# Patient Record
Sex: Female | Born: 1965 | Race: White | Hispanic: No | Marital: Married | Smoking: Former smoker
Health system: Southern US, Community
[De-identification: ages and names within clinical notes are randomized; demographics above are authoritative.]

## PROBLEM LIST (undated history)

## (undated) DIAGNOSIS — M1990 Unspecified osteoarthritis, unspecified site: Secondary | ICD-10-CM

## (undated) DIAGNOSIS — R519 Headache, unspecified: Secondary | ICD-10-CM

## (undated) DIAGNOSIS — F32A Depression, unspecified: Secondary | ICD-10-CM

## (undated) DIAGNOSIS — F329 Major depressive disorder, single episode, unspecified: Secondary | ICD-10-CM

## (undated) DIAGNOSIS — I739 Peripheral vascular disease, unspecified: Secondary | ICD-10-CM

## (undated) DIAGNOSIS — K5090 Crohn's disease, unspecified, without complications: Secondary | ICD-10-CM

## (undated) HISTORY — DX: Peripheral vascular disease, unspecified: I73.9

## (undated) HISTORY — PX: ABDOMINAL SURGERY: SHX537

## (undated) HISTORY — DX: Depression, unspecified: F32.A

## (undated) HISTORY — DX: Major depressive disorder, single episode, unspecified: F32.9

---

## 1998-07-07 ENCOUNTER — Encounter: Admission: RE | Admit: 1998-07-07 | Discharge: 1998-07-07 | Payer: Self-pay | Admitting: *Deleted

## 1999-03-11 ENCOUNTER — Emergency Department (HOSPITAL_COMMUNITY): Admission: EM | Admit: 1999-03-11 | Discharge: 1999-03-11 | Payer: Self-pay | Admitting: Emergency Medicine

## 1999-03-23 ENCOUNTER — Other Ambulatory Visit: Admission: RE | Admit: 1999-03-23 | Discharge: 1999-03-23 | Payer: Self-pay | Admitting: Internal Medicine

## 1999-08-10 ENCOUNTER — Encounter: Payer: Self-pay | Admitting: Internal Medicine

## 1999-08-10 ENCOUNTER — Ambulatory Visit (HOSPITAL_COMMUNITY): Admission: RE | Admit: 1999-08-10 | Discharge: 1999-08-10 | Payer: Self-pay | Admitting: Internal Medicine

## 2000-10-04 ENCOUNTER — Encounter: Admission: RE | Admit: 2000-10-04 | Discharge: 2000-10-04 | Payer: Self-pay | Admitting: Family Medicine

## 2000-10-04 ENCOUNTER — Encounter: Payer: Self-pay | Admitting: Family Medicine

## 2002-01-22 ENCOUNTER — Encounter (HOSPITAL_COMMUNITY): Admission: RE | Admit: 2002-01-22 | Discharge: 2002-04-22 | Payer: Self-pay | Admitting: Internal Medicine

## 2002-05-19 ENCOUNTER — Encounter (HOSPITAL_COMMUNITY): Admission: RE | Admit: 2002-05-19 | Discharge: 2002-08-17 | Payer: Self-pay | Admitting: Internal Medicine

## 2002-10-15 ENCOUNTER — Encounter (HOSPITAL_COMMUNITY): Admission: RE | Admit: 2002-10-15 | Discharge: 2003-01-13 | Payer: Self-pay | Admitting: Internal Medicine

## 2004-12-12 ENCOUNTER — Ambulatory Visit: Payer: Self-pay | Admitting: Internal Medicine

## 2004-12-14 ENCOUNTER — Encounter: Admission: RE | Admit: 2004-12-14 | Discharge: 2004-12-14 | Payer: Self-pay | Admitting: Internal Medicine

## 2004-12-28 ENCOUNTER — Ambulatory Visit: Payer: Self-pay | Admitting: Internal Medicine

## 2005-01-12 ENCOUNTER — Ambulatory Visit: Payer: Self-pay | Admitting: Internal Medicine

## 2005-02-13 ENCOUNTER — Encounter: Admission: RE | Admit: 2005-02-13 | Discharge: 2005-02-13 | Payer: Self-pay | Admitting: Family Medicine

## 2005-07-03 ENCOUNTER — Ambulatory Visit: Payer: Self-pay | Admitting: Internal Medicine

## 2005-07-12 ENCOUNTER — Ambulatory Visit (HOSPITAL_COMMUNITY): Admission: RE | Admit: 2005-07-12 | Discharge: 2005-07-12 | Payer: Self-pay | Admitting: Internal Medicine

## 2005-07-20 ENCOUNTER — Ambulatory Visit: Payer: Self-pay | Admitting: Internal Medicine

## 2005-08-10 ENCOUNTER — Ambulatory Visit (HOSPITAL_COMMUNITY): Admission: RE | Admit: 2005-08-10 | Discharge: 2005-08-10 | Payer: Self-pay | Admitting: General Surgery

## 2005-08-30 ENCOUNTER — Inpatient Hospital Stay (HOSPITAL_COMMUNITY): Admission: RE | Admit: 2005-08-30 | Discharge: 2005-09-03 | Payer: Self-pay | Admitting: General Surgery

## 2005-08-30 ENCOUNTER — Encounter (INDEPENDENT_AMBULATORY_CARE_PROVIDER_SITE_OTHER): Payer: Self-pay | Admitting: Specialist

## 2005-09-07 ENCOUNTER — Inpatient Hospital Stay (HOSPITAL_COMMUNITY): Admission: AD | Admit: 2005-09-07 | Discharge: 2005-09-21 | Payer: Self-pay | Admitting: General Surgery

## 2005-09-08 ENCOUNTER — Encounter (INDEPENDENT_AMBULATORY_CARE_PROVIDER_SITE_OTHER): Payer: Self-pay | Admitting: Specialist

## 2005-09-20 ENCOUNTER — Encounter (INDEPENDENT_AMBULATORY_CARE_PROVIDER_SITE_OTHER): Payer: Self-pay | Admitting: *Deleted

## 2005-10-02 ENCOUNTER — Ambulatory Visit: Payer: Self-pay | Admitting: Internal Medicine

## 2005-11-29 ENCOUNTER — Ambulatory Visit: Payer: Self-pay | Admitting: Internal Medicine

## 2008-09-10 DIAGNOSIS — K5090 Crohn's disease, unspecified, without complications: Secondary | ICD-10-CM | POA: Insufficient documentation

## 2008-09-10 DIAGNOSIS — D126 Benign neoplasm of colon, unspecified: Secondary | ICD-10-CM

## 2008-09-14 ENCOUNTER — Ambulatory Visit: Payer: Self-pay | Admitting: Internal Medicine

## 2008-09-14 DIAGNOSIS — K5000 Crohn's disease of small intestine without complications: Secondary | ICD-10-CM | POA: Insufficient documentation

## 2008-09-14 DIAGNOSIS — R1031 Right lower quadrant pain: Secondary | ICD-10-CM | POA: Insufficient documentation

## 2008-09-14 DIAGNOSIS — R197 Diarrhea, unspecified: Secondary | ICD-10-CM | POA: Insufficient documentation

## 2008-09-14 LAB — CONVERTED CEMR LAB
ALT: 15 units/L (ref 0–35)
AST: 19 units/L (ref 0–37)
Albumin: 3.9 g/dL (ref 3.5–5.2)
Alkaline Phosphatase: 67 units/L (ref 39–117)
BUN: 11 mg/dL (ref 6–23)
Basophils Absolute: 0 10*3/uL (ref 0.0–0.1)
Basophils Relative: 0 % (ref 0.0–3.0)
Bilirubin, Direct: 0.1 mg/dL (ref 0.0–0.3)
CO2: 28 meq/L (ref 19–32)
Calcium: 9.3 mg/dL (ref 8.4–10.5)
Chloride: 108 meq/L (ref 96–112)
Creatinine, Ser: 0.6 mg/dL (ref 0.4–1.2)
Eosinophils Absolute: 0.2 10*3/uL (ref 0.0–0.7)
Eosinophils Relative: 1.9 % (ref 0.0–5.0)
GFR calc Af Amer: 140 mL/min
GFR calc non Af Amer: 116 mL/min
Glucose, Bld: 98 mg/dL (ref 70–99)
HCT: 38.2 % (ref 36.0–46.0)
Hemoglobin: 12.7 g/dL (ref 12.0–15.0)
Lymphocytes Relative: 27.2 % (ref 12.0–46.0)
MCHC: 33.3 g/dL (ref 30.0–36.0)
MCV: 98.5 fL (ref 78.0–100.0)
Monocytes Absolute: 0.2 10*3/uL (ref 0.1–1.0)
Monocytes Relative: 1.7 % — ABNORMAL LOW (ref 3.0–12.0)
Neutro Abs: 6.3 10*3/uL (ref 1.4–7.7)
Neutrophils Relative %: 69.2 % (ref 43.0–77.0)
Platelets: 130 10*3/uL — ABNORMAL LOW (ref 150–400)
Potassium: 3.8 meq/L (ref 3.5–5.1)
RBC: 3.87 M/uL (ref 3.87–5.11)
RDW: 12.2 % (ref 11.5–14.6)
Sodium: 140 meq/L (ref 135–145)
TSH: 2.4 microintl units/mL (ref 0.35–5.50)
Total Bilirubin: 0.6 mg/dL (ref 0.3–1.2)
Total Protein: 6.9 g/dL (ref 6.0–8.3)
WBC: 9.2 10*3/uL (ref 4.5–10.5)

## 2008-09-29 ENCOUNTER — Ambulatory Visit: Payer: Self-pay | Admitting: Internal Medicine

## 2008-09-29 ENCOUNTER — Encounter: Payer: Self-pay | Admitting: Internal Medicine

## 2008-09-29 ENCOUNTER — Encounter (INDEPENDENT_AMBULATORY_CARE_PROVIDER_SITE_OTHER): Payer: Self-pay | Admitting: *Deleted

## 2008-09-30 ENCOUNTER — Encounter: Payer: Self-pay | Admitting: Internal Medicine

## 2008-10-19 ENCOUNTER — Ambulatory Visit: Payer: Self-pay | Admitting: Internal Medicine

## 2008-12-24 ENCOUNTER — Encounter: Payer: Self-pay | Admitting: Internal Medicine

## 2009-01-13 ENCOUNTER — Ambulatory Visit: Payer: Self-pay | Admitting: Internal Medicine

## 2009-01-15 LAB — CONVERTED CEMR LAB
Basophils Absolute: 0 10*3/uL (ref 0.0–0.1)
Basophils Relative: 0.1 % (ref 0.0–3.0)
Eosinophils Absolute: 0.1 10*3/uL (ref 0.0–0.7)
Eosinophils Relative: 1 % (ref 0.0–5.0)
HCT: 38.4 % (ref 36.0–46.0)
Hemoglobin: 13.4 g/dL (ref 12.0–15.0)
Lymphocytes Relative: 21.7 % (ref 12.0–46.0)
MCHC: 34.7 g/dL (ref 30.0–36.0)
MCV: 106.1 fL — ABNORMAL HIGH (ref 78.0–100.0)
Monocytes Absolute: 0.3 10*3/uL (ref 0.1–1.0)
Monocytes Relative: 2.8 % — ABNORMAL LOW (ref 3.0–12.0)
Neutro Abs: 8.6 10*3/uL — ABNORMAL HIGH (ref 1.4–7.7)
Neutrophils Relative %: 74.4 % (ref 43.0–77.0)
Platelets: 225 10*3/uL (ref 150–400)
RBC: 3.62 M/uL — ABNORMAL LOW (ref 3.87–5.11)
RDW: 15.8 % — ABNORMAL HIGH (ref 11.5–14.6)
WBC: 11.5 10*3/uL — ABNORMAL HIGH (ref 4.5–10.5)

## 2009-03-17 ENCOUNTER — Encounter: Payer: Self-pay | Admitting: Internal Medicine

## 2009-03-22 ENCOUNTER — Telehealth: Payer: Self-pay | Admitting: Internal Medicine

## 2009-03-23 ENCOUNTER — Ambulatory Visit: Payer: Self-pay | Admitting: Internal Medicine

## 2009-03-23 LAB — CONVERTED CEMR LAB
Basophils Absolute: 0 10*3/uL (ref 0.0–0.1)
Basophils Relative: 0.1 % (ref 0.0–3.0)
Eosinophils Absolute: 0.2 10*3/uL (ref 0.0–0.7)
Eosinophils Relative: 1.9 % (ref 0.0–5.0)
HCT: 38 % (ref 36.0–46.0)
Hemoglobin: 13.1 g/dL (ref 12.0–15.0)
Lymphocytes Relative: 13.1 % (ref 12.0–46.0)
Lymphs Abs: 1.1 10*3/uL (ref 0.7–4.0)
MCHC: 34.4 g/dL (ref 30.0–36.0)
MCV: 105.1 fL — ABNORMAL HIGH (ref 78.0–100.0)
Monocytes Absolute: 0.6 10*3/uL (ref 0.1–1.0)
Monocytes Relative: 7.7 % (ref 3.0–12.0)
Neutro Abs: 6.5 10*3/uL (ref 1.4–7.7)
Neutrophils Relative %: 77.2 % — ABNORMAL HIGH (ref 43.0–77.0)
Platelets: 157 10*3/uL (ref 150.0–400.0)
RBC: 3.61 M/uL — ABNORMAL LOW (ref 3.87–5.11)
RDW: 13.2 % (ref 11.5–14.6)
WBC: 8.4 10*3/uL (ref 4.5–10.5)

## 2009-04-07 ENCOUNTER — Encounter: Payer: Self-pay | Admitting: Internal Medicine

## 2009-04-13 ENCOUNTER — Ambulatory Visit: Payer: Self-pay | Admitting: Internal Medicine

## 2009-05-24 ENCOUNTER — Ambulatory Visit: Payer: Self-pay | Admitting: Internal Medicine

## 2009-05-25 LAB — CONVERTED CEMR LAB
ALT: 18 units/L (ref 0–35)
AST: 20 units/L (ref 0–37)
Albumin: 3.8 g/dL (ref 3.5–5.2)
Alkaline Phosphatase: 67 units/L (ref 39–117)
Basophils Absolute: 0 10*3/uL (ref 0.0–0.1)
Basophils Relative: 0.2 % (ref 0.0–3.0)
Bilirubin, Direct: 0.1 mg/dL (ref 0.0–0.3)
Eosinophils Absolute: 0.1 10*3/uL (ref 0.0–0.7)
Eosinophils Relative: 1.7 % (ref 0.0–5.0)
HCT: 35.8 % — ABNORMAL LOW (ref 36.0–46.0)
Hemoglobin: 12.7 g/dL (ref 12.0–15.0)
Lymphocytes Relative: 20.9 % (ref 12.0–46.0)
Lymphs Abs: 1.8 10*3/uL (ref 0.7–4.0)
MCHC: 35.6 g/dL (ref 30.0–36.0)
MCV: 99.5 fL (ref 78.0–100.0)
Monocytes Absolute: 0.3 10*3/uL (ref 0.1–1.0)
Monocytes Relative: 4 % (ref 3.0–12.0)
Neutro Abs: 6.4 10*3/uL (ref 1.4–7.7)
Neutrophils Relative %: 73.2 % (ref 43.0–77.0)
Platelets: 165 10*3/uL (ref 150.0–400.0)
RBC: 3.6 M/uL — ABNORMAL LOW (ref 3.87–5.11)
RDW: 12.6 % (ref 11.5–14.6)
Total Bilirubin: 0.8 mg/dL (ref 0.3–1.2)
Total Protein: 7.2 g/dL (ref 6.0–8.3)
WBC: 8.6 10*3/uL (ref 4.5–10.5)

## 2009-07-26 ENCOUNTER — Ambulatory Visit: Payer: Self-pay | Admitting: Internal Medicine

## 2009-07-27 LAB — CONVERTED CEMR LAB
Basophils Absolute: 0.1 10*3/uL (ref 0.0–0.1)
Basophils Relative: 0.6 % (ref 0.0–3.0)
Eosinophils Absolute: 0.1 10*3/uL (ref 0.0–0.7)
Eosinophils Relative: 1.5 % (ref 0.0–5.0)
HCT: 39.3 % (ref 36.0–46.0)
Hemoglobin: 13.8 g/dL (ref 12.0–15.0)
Lymphocytes Relative: 26.2 % (ref 12.0–46.0)
Lymphs Abs: 2.4 10*3/uL (ref 0.7–4.0)
MCHC: 35.1 g/dL (ref 30.0–36.0)
MCV: 99.4 fL (ref 78.0–100.0)
Monocytes Absolute: 0.4 10*3/uL (ref 0.1–1.0)
Monocytes Relative: 4.4 % (ref 3.0–12.0)
Neutro Abs: 6.1 10*3/uL (ref 1.4–7.7)
Neutrophils Relative %: 67.3 % (ref 43.0–77.0)
Platelets: 144 10*3/uL — ABNORMAL LOW (ref 150.0–400.0)
RBC: 3.95 M/uL (ref 3.87–5.11)
RDW: 13.1 % (ref 11.5–14.6)
WBC: 9.1 10*3/uL (ref 4.5–10.5)

## 2009-09-09 ENCOUNTER — Encounter (INDEPENDENT_AMBULATORY_CARE_PROVIDER_SITE_OTHER): Payer: Self-pay | Admitting: *Deleted

## 2009-12-20 ENCOUNTER — Encounter (INDEPENDENT_AMBULATORY_CARE_PROVIDER_SITE_OTHER): Payer: Self-pay | Admitting: *Deleted

## 2010-03-31 ENCOUNTER — Telehealth: Payer: Self-pay | Admitting: Internal Medicine

## 2010-12-06 NOTE — Letter (Signed)
Summary: Colonoscopy Date Change Letter  [facility]  Chaney, Melissa [address]   Phone: [phone]  Fax: [phone]      December 20, 2009 MRN: [id number]   [facility] [place], [place]  [address]   Dear Melissa Chaney,   Previously you were recommended to have a repeat colonoscopy around this time. Your chart was recently reviewed by [name] of [facility]. Follow up colonoscopy is now recommended in March 2017. This revised recommendation is based on current, nationally recognized guidelines for colorectal cancer screening and polyp surveillance. These guidelines are endorsed by the [place] Task Force on Colorectal Cancer as well as numerous other major medical organizations.  Please understand that our recommendation assumes that you do not have any new symptoms such as bleeding, a change in bowel habits, anemia, or significant abdominal discomfort. If you do have any concerning GI symptoms or want to discuss the guideline recommendations, please call to arrange an office visit at your earliest convenience. Otherwise we will keep you in our reminder system and contact you 1-2 months prior to the date listed above to schedule your next colonoscopy.  Thank you,  Docia Chuck. Henrene Pastor, M.D.  [facility] [phone]

## 2010-12-06 NOTE — Progress Notes (Signed)
Summary: Schedule Office Visit  Phone Note Outgoing Call Call back at [facility] Phone [phone]   Call placed by: Bernita Buffy CMA Deborra Medina),  Mar 31, 2010 9:18 AM Call placed to: Patient Summary of Call: Left message on patients machine to call back. patient is due for recall office visit  Initial call taken by: Bernita Buffy CMA Deborra Medina),  Mar 31, 2010 9:19 AM  Follow-up for Phone Call        Left message on patients machine to call back. we will mail the pt a letter to remind her she is due for an office visit. Follow-up by: Bernita Buffy CMA ([place]),  April 18, 2010 1:28 PM

## 2011-03-24 NOTE — Op Note (Signed)
NAMENIKCOLE, EISCHEID                ACCOUNT NO.:  [id number]   MEDICAL RECORD NO.:  [id number]          PATIENT TYPE:  INP   LOCATION:  0005                         FACILITY:  [facility]   PHYSICIAN:  Edsel Petrin. Dalbert Batman, M.D.DATE OF BIRTH:  1966-07-27   DATE OF PROCEDURE:  08/30/2005  DATE OF DISCHARGE:                                 OPERATIVE REPORT   PREOPERATIVE DIAGNOSIS:  Crohn disease.   POSTOPERATIVE DIAGNOSIS:  Crohn disease.   OPERATION PERFORMED:  Laparoscopic assisted ileocecectomy.   SURGEON:  Dr. Edsel Petrin. Dalbert Batman.   FIRST ASSISTANT:  Dr. Alphonsa Overall.   OPERATIVE INDICATIONS:  This is a 45 year old white female who has carried a  diagnosis of Crohn's disease for 6 years. She has had progressive symptoms  despite a variety of medical regimens. She has lost 10-15 pounds over the  past 3 years. She has had a small-bowel follow-through which shows extensive  involvement of the terminal ileum with stricturing and dilatation of the  ileum proximal to this. No fistulas or abscesses. She has had a colonoscopy  which shows ulcers and stenosis of the ileum but the colon looks normal.  CT  scan shows thickening edema of the distal small bowel but no abscess or free  fluid. Dr. Scarlette Shorts asked me to consider resection of this area because of  progressive symptoms despite maximal medical management. She underwent a  bowel prep at home and is brought to the operating room electively.   OPERATIVE FINDINGS:  The patient's distal ileum was involved with classic  Crohn disease. About 2 to 2.5 feet of terminal ileum were very thickened  with extensive fat creeping, pinning of the mesentery and the mid ileum just  proximal to this area was somewhat dilated suggesting a chronic partial  obstruction. The right colon, transverse colon and the left colon looked  normal. I examined the entire small bowel from the ligament of Treitz all  the way down to the ileocecal valve and there were no  other obvious areas of  involvement. The liver looked normal. There was no ascites. The peritoneal  surfaces looked normal. There was no abscess or fistula apparent.   OPERATIVE TECHNIQUE:  Following the induction of general endotracheal  anesthesia, the patient's abdomen was prepped and draped in a sterile  fashion. The patient was identified. Intravenous antibiotics were given.  0.5% Marcaine with epinephrine was used as a local infiltration anesthetic.  A 10 mm Hassan trocar was inserted just above the umbilicus through a  midline incision and secured with a pursestring suture of #0 Vicryl.  Pneumoperitoneum was created. A video camera was inserted with findings as  described above. A 10 mm trocar was placed in the lower midline just at the  hairline and a 10 mm trocar was placed in the left mid abdomen.   We positioned the patient in Trendelenburg position, rolled to the left. We  mobilized the terminal ileum and right colon and hepatic flexure using a  harmonic scalpel and blunt dissection mobilizing the colon all the way over  to the midline. We identified the duodenum  without any difficulty. We very  carefully spent some time taking down the mesentery of the ileocecal area to  get good mobilization. Once we had good mobilization, we released the  pneumoperitoneum and removed the [facility] trocar at the umbilicus and made  about a 6 cm vertically oriented incision above the umbilicus dividing the  fascia in the midline. We placed a wound protractor in place and we were  able to extract the right colon and ileum through this wound without too  much trouble. There was no bleeding. We transected the ileum about 6 inches  proximal to the obvious diseased area. We did this transection with a GIA  stapling device. We transected the cecum just above the ileocecal valve with  a GIA stapling device. We very carefully took down the mesentery dividing  the peritoneum with cautery and then  isolating mesenteric vessels between  Kelly clamps and dividing them. These were tied with 2-0 silk ties but the  larger thicker ones, we suture ligated with numerous 2-0 silk suture  ligatures. We spent some time making sure that everything was completely  clean and hemostatic. We irrigated this area out. The specimen was sent on  to pathology for routine histology. Anastomosis was created between the mid  ileum and the right colon using a GIA stapling device. The defect in the  bowel wall was closed with TA-60 stapling device. At this point, we changed  our gloves and instruments and suction devices. We closed the mesentery with  interrupted sutures of 2-0 silk. We irrigated out the abdomen with some  saline, there was no bleeding that we could detect. We examined the  anastomosis. It looked good. We placed some 2-0 silk sutures to reinforce  the staple line at critical places. We then placed Tisseel tissue sealant  all along the anastomosis line and let that harden for about 5 minutes and  then returned the bowel to the abdominal cavity. The midline fascia was  closed with a running suture of #1 PDS, the wound was irrigated with saline,  skin closed with skin staples. The pneumoperitoneum was reestablished. We  reinserted the video camera and examined the abdominal cavity, pelvis and  subphrenic and subhepatic space. There was a little bit of blood tinged  fluid which was evacuated. We checked all the areas of dissection and there  was no bleeding. We removed the trocars under direct vision, there is no  bleeding from the trocar sites. The pneumoperitoneum was released. The two  remaining trocar sites were closed with skin staples. Clean bandages were  placed and the patient taken to the recovery room in stable condition.  Estimated blood loss was probably about 40 mL. Complications none. Sponge,  needle and instrument counts were correct.      Edsel Petrin. Dalbert Batman, M.D. Electronically  Signed     HMI/MEDQ  D:  08/30/2005  T:  08/30/2005  Job:  [id number]   cc:   Docia Chuck. Henrene Pastor, M.D. [facility]  [address]  [place]  [place] [address]   Chipper Herb, M.D.  Fax: [phone]

## 2011-03-24 NOTE — Discharge Summary (Signed)
NAMESHELA, Melissa Chaney                ACCOUNT NO.:  [id number]   MEDICAL RECORD NO.:  [id number]          PATIENT TYPE:  INP   LOCATION:  55                         FACILITY:  [facility]   PHYSICIAN:  Edsel Petrin. Dalbert Batman, M.D.DATE OF BIRTH:  [birth year]   DATE OF ADMISSION:  08/30/2005  DATE OF DISCHARGE:  09/03/2005                                 DISCHARGE SUMMARY   FINAL DIAGNOSIS:  Crohn's disease with partial obstruction of the ileum.   OPERATION PERFORMED:  Laparoscopic-assisted resection of the distal ileum  and cecum with primary anastomosis.   DATE OF SURGERY:  08/30/2005   HISTORY:  This is a 45 year old white female who has a 6-year history of  Crohn disease. She had progressive symptoms with right lower quadrant pain,  nausea, and weight loss over the past 3 years.  This has occurred despite  maximal medical management. Small-bowel follow through shows that she has  extensive involvement of terminal ileum with structuring and dilatation of  the bowels, proximal to this, suggesting partial obstruction. She has never  had a fistula or abscess demonstrated. She had a colonoscopy which showed  ulcerative stenosis of the ileum, but the colon looked normal. She was  referred by [name] for consideration of resection of her strictured area.  She was counseled as an outpatient regarding this. She has undergone a drop  prep at home; and was brought to operating room electively.   PHYSICAL EXAM:  GENERAL:  A pleasant, healthy-appearing, thin, white female  in the distress.  NECK:  Reveals no adenopathy mass.  LUNGS:  Clear to auscultation.  HEART:  Regular rate and rhythm, no murmur.  ABDOMEN:  Soft, nondistended. She is subjectively tender in the right lower  quadrant, but no mass, no guarding no rebound, no hernias.   HOSPITAL COURSE:  On the admission the patient was taken to operating room  and underwent  laparoscopic-assisted resection of her Crohn disease. I found  that she  had about 2 feet of clearly diseased ileum. The right colon looked  perfectly normal, around the small bowel on two occasions; and there was no  other disease identified. We resected all gross disease and performed a  primary anastomosis.   Postoperatively the patient did well. She began having flatus on postop day  #2 and also had a bowel movement that day after a Dulcolax suppository,  although she felt a little distended.  On 10/29 she has had four bowel  movements and felt much better; and wanted to go home. She was discharged on  09/03/2005. She was given a prescription for Vicodin for pain. She was asked  to followup in 3-5 days to get her staples removed.      Edsel Petrin. Dalbert Batman, M.D.  Electronically Signed     HMI/MEDQ  D:  09/11/2005  T:  09/11/2005  Job:  [id number]   cc:   Docia Chuck. Henrene Pastor, M.D. [facility]  [address]  [place]  [place] [address]   Chipper Herb, M.D.  Fax: [phone]

## 2011-03-24 NOTE — Op Note (Signed)
NAMEXELA, Melissa Chaney                ACCOUNT NO.:  [id number]   MEDICAL RECORD NO.:  [id number]          PATIENT TYPE:  INP   LOCATION:  5730                         FACILITY:  [facility]   PHYSICIAN:  Edsel Petrin. Dalbert Batman, M.D.DATE OF BIRTH:  Nov 13, 1965   DATE OF PROCEDURE:  09/08/2005  DATE OF DISCHARGE:                                 OPERATIVE REPORT   PREOPERATIVE DIAGNOSIS:  Small-bowel obstruction.   POSTOPERATIVE DIAGNOSIS:  Small-bowel obstruction secondary to adhesions and  suspected microscopic anastomotic leak.   OPERATION PERFORMED:  1.  Exploratory laparotomy.  2.  Lysis of adhesions.  3.  Resection of ileocolic anastomosis with creation of new primary      anastomosis.   SURGEON:  Edsel Petrin. Dalbert Batman, M.D.   FIRST ASSISTANT:  Shellia Carwin, M.D.   OPERATIVE INDICATIONS:  This is a 45 year old white female who has a 6-year  history of Crohn disease.  She has been on progressive multiple  immunosuppressive drugs and has failed to respond.  She went on to develop  stricturing of her ileum and partial obstruction.  She was operated upon  electively on August 30, 2005, at which time she underwent an uneventful  laparoscopic-assisted ileocolic resection.  She did well and was discharged  home approximately 5 or 6 days later.  She came back to the office yesterday  with nausea, vomiting, diffuse abdominal pain and was found be obviously  dehydrated and hypotensive.  She was admitted to hospital last night and had  a nasogastric tube placed with large output.  She had abdominal films and CT  scan last night which showed a distal small-bowel obstruction, but no  evidence of abscess, free air or perforation.  Her white blood cell count is  normal.  This morning she felt a little bit better, but not much, still had  mild diffuse abdominal tenderness and x-ray showed no improvement in her  bowel obstruction.  She is brought to operating room for exploration.   OPERATIVE FINDINGS:   The patient was obstructed in the distal ileum from the  anastomosis being stuck down in the pelvis.  When I pulled the anastomosis  up, there was a little bit of inflammatory exudate on it.  There was no  significant odor or fecal soilage, but the small bowel was quite dilated.  I  examined the entire small bowel on several occasions from the ligament of  Treitz all the way down to the anastomosis and found that I could milk small  bowel content easily into the colon.  On a couple of occasions, I thought I  smelled a small feculent odor, but I was not sure.  I began to examine the  anastomosis and saw that there was a little bit of inflammatory exudate.  There was 1 area where I probed with a hemostat that then went into the  anastomosis.  I theorize that she must have a small microscopic perforation  that set up an inflammatory reaction causing adhesions in the pelvis and the  subsequent obstruction.  I felt that the anastomosis would need to be  resected.  I transected the terminal ileum about 3 or 4 inches proximal to  the anastomosis with a GIA stapling device.  I transected the ascending  colon about 2 inches distal to the anastomosis.  The mesentery was somewhat  thickened from the chronic Crohn's disease; the mesenteric vessels were  isolated, clamped, and divided, and ligated with 2-0 silk ties and larger  areas were ligated 2-0 silk suture ligatures.  Specimen was removed.  It  should be noted that I took cultures earlier in the case of the peritoneal  fluid.   I then irrigated the abdomen and pelvis quite copiously with about 6 L of  saline until the irrigation fluid was completely clear.  I checked the  transverse colon, descending colon and sigmoid colon and it looked fine.  I  ran the small bowel from ligament of Treitz all the way down to the resected  area and it looked fine.  I milked the small bowel content retrograde and it  was evacuated out the NG tube, approximately  800 mL.   We created a new anastomosis between the terminal ileum and the mid-  ascending colon using a GIA stapling device.  We inspected the staple line  and it looked perfectly fine.  The defect in the bowel wall was closed with  a TA 60 stapling device.  There were a few bleeders on the TA 60 staple line  and these were controlled with figure-of-eight sutures of 3-0 silk.  A few  extra sutures of 2-0 silk were placed to reinforce the staple line at  strategic places.   We then changed our gloves and instruments and then irrigated the abdomen  copiously with another 2 or 3 L of saline.  The mesentery was closed with  interrupted figure-of-eight sutures of 2-0 silk.  We checked all the areas  of dissection and suturing and there is no bleeding or hematoma anywhere.  We checked the duodenum; it looked fine.  We checked a nasogastric tube and  positioned it. The stomach was empty.  We felt that nothing further needed  to be done.  The midline fascia was closed with a running suture of #1 PDS.  The skin was closed with skin staples.  Clean bandages were placed and the  patient taken to the recovery room in stable condition.  Estimated blood  loss was about 100 mL. Complications -- none.  Sponge, needle and instrument  counts were correct.      Edsel Petrin. Dalbert Batman, M.D.  Electronically Signed     HMI/MEDQ  D:  09/08/2005  T:  09/09/2005  Job:  [id number]   cc:   Docia Chuck. Henrene Pastor, M.D. [facility]  [address]  [place]  [place] [address]   Chipper Herb, M.D.  Fax: [phone]

## 2011-03-24 NOTE — H&P (Signed)
NAMEBRISIA, Chaney                ACCOUNT NO.:  [id number]   MEDICAL RECORD NO.:  [id number]            PATIENT TYPE:   LOCATION:                                 FACILITY:   PHYSICIAN:  Edsel Petrin. Melissa Chaney, M.D.     DATE OF BIRTH:   DATE OF ADMISSION:  09/07/2005  DATE OF DISCHARGE:                                HISTORY & PHYSICAL   CHIEF COMPLAINT:  Abdominal pain, dehydration, vomiting.   HISTORY OF PRESENT ILLNESS:  This is a 45 year old white female who has a 6-  year history of Crohn's disease.  Because of failure to respond to medical  therapy, she underwent a laparoscopic-assisted ileocecectomy on August 30, 2005.  In the hospital, she did very well with resumption of diet and bowel  function, and was discharged from the hospital 5 days ago.  She states that  she has not felt well since.  She has had nausea and vomiting on a daily  basis at least twice.  She will vomit small amounts, probably less than 8  ounces, of greenish fluid.  No large amounts of vomiting.  She has continued  to have stools 2-3 times a day that tend to be brownish liquid, but not very  large volume.  She denies any fever or chills.  She has been somewhat dizzy.   PAST HISTORY:  1.  Crohn's disease x6 years, status post resection August 30, 2005.  2.  She has had 2 pregnancies and 2 vaginal deliveries.   CURRENT MEDICATIONS:  1.  Hydrocodone p.r.n. pain.  2.  Wellbutrin 300 mg one tablet daily.  3.  Calcium with D 600 mg b.i.d.  4.  She used to be on Entocort EC 3 mg three capsules daily.  5.  She was on mercaptopurine 50 mg two tablets daily, but that has been      withheld postoperatively.   DRUG ALLERGIES:  CODEINE.   SOCIAL HISTORY:  Married with 2 children.  He husband is not here, but her  mother is with her today.  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  She smokes 1 pack of cigarettes a  day.  Drinks alcohol occasionally.   FAMILY  HISTORY:  Father and mother living.  Both have hypertension.  Four  sisters - 1 with Crohn's disease.   REVIEW OF SYSTEMS:  All systems reviewed.  They are noncontributory, except  as described above.   PHYSICAL EXAMINATION:  GENERAL:  A thin young woman who appears ill.  She is  alert and cooperative.  VITAL SIGNS:  Blood pressure 88/44, pulse 120 and regular, temperature 97.2.  SKIN:  Dry.  HEENT:  Eyes - sclerae clear.  Extraocular movements intact.  Ears, nose,  mouth, and throat - nose, lips, tongue, and oropharynx are without gross  lesions.  NECK:  Supple, nontender.  No mass.  No adenopathy.  No jugular venous  distention.  LUNGS:  Clear to auscultation.  HEART:  Regular tachycardia.  No murmur.  ABDOMEN:  Scars are  healing well without any signs of infection.  The  abdomen is borderline distended.  Bowel sounds are present but are  hypoactive.  She has mild diffuse tenderness.  No rigidity.  EXTREMITIES:  She moves all 4 extremities well without pain or deformity.  NEUROLOGIC:  No gross motor or sensory deficit.   ASSESSMENT:  1.  Abdominal pain, vomiting, hypotension, and dehydration.  Rule out      postoperative obstruction.  Rule out postoperative anastomotic leak.      Rule out postoperative abscess.  2.  History of Crohn's disease status post resection on August 30, 2005.   PLAN:  1.  The patient will be admitted as an emergency to [facility] today      where a bed is available.  2.  She will received IV fluid resuscitation over the next hour or two in      hopes of normalizing her vital signs.  3.  We will then proceed with imaging with plain films and CT scanning.   I have explained to the patient and her mother that this may represent a  surgical complication, as listed above.  We will await the work-up.      Edsel Petrin. Melissa Chaney, M.D.  Electronically Signed     HMI/MEDQ  D:  09/07/2005  T:  09/07/2005  Job:  [id number]   cc:   Docia Chuck. Henrene Pastor, M.D. [facility]   [address]  [place]  [place] [address]   Chipper Herb, M.D.  Fax: [phone]

## 2011-03-24 NOTE — Discharge Summary (Signed)
NAMELILLER, YOHN                ACCOUNT NO.:  [id number]   MEDICAL RECORD NO.:  [id number]          PATIENT TYPE:  INP   LOCATION:  5730                         FACILITY:  [facility]   PHYSICIAN:  Edsel Petrin. Dalbert Batman, M.D.DATE OF BIRTH:  16-Sep-1966   DATE OF ADMISSION:  09/07/2005  DATE OF DISCHARGE:  09/21/2005                                 DISCHARGE SUMMARY   FINAL DIAGNOSES:  1.  Small-bowel obstruction secondary to adhesions.  2.  Suspect microscopic anastomotic leak.  3.  Crohn's disease.   PROCEDURES:  Exploratory laparotomy with lysis of adhesions, resection of  ileocolic anastomosis with redo ileocolic anastomosis on September 08, 2005.   HISTORY OF PRESENT ILLNESS:  This is a 45 year old white female with a six-  year history of Crohn's disease.  She failed to respond to medical therapy  and underwent a laparoscopic-assisted limited right colectomy on August 30, 2005.  She did well and was discharged from the hospital five days prior to  this admission.  She has not felt well at home and had nausea and vomiting  but continued to have stools.  She was admitted for evaluation of her  abdominal pain and possible postoperative complications.   PHYSICAL EXAMINATION:  GENERAL:  Thin young woman who appeared ill, alert  and cooperative.  VITAL SIGNS:  Blood pressure 88/44, pulse 120 and regular, respirations 18,  temperature 97.2.  HEENT:  Mucous membranes were dry.  ABDOMEN:  Well-healed scars.  The abdomen was slightly distended.  Bowel  sounds were present by hypoactive.  Mild diffuse tenderness.  No rigidity.   HOSPITAL COURSE:  The patient was admitted and started on IV fluid  resuscitation.  X-rays suggested a small-bowel obstruction.  A nasogastric  tube was placed and had moderate output, up to 900 cc in an eight-hour  period.  Her white blood cell count was 7400, hemoglobin 10.9.  A CT scan  was performed which showed a persistent small-bowel obstruction with one  loop of jejunum 6.4 cm in diameter and some air in the colon.  She remained  quite tender, and we elected to proceed with a laparotomy.   She was taken to the operating room on September 08, 2005.  She had moderate  adhesions, especially to the anastomosis.  There was no obvious perforation.  There was no obvious abscess.  There was no obvious exudate, but I felt that  there was some technical problem with the anastomosis, so I elected to  resect the anastomosis and perform a new anastomosis of the ilium to the  hepatic flexure.  That was uneventful.   Postoperatively, the patient did relatively well, without any major  complications.  Her Foley catheter was removed, and she voided uneventfully.  She had some hypokalemia which was corrected with intravenous potassium.  She was maintained on hyperalimentation because of her presumed malnourished  state.  Intraoperative cultures were taken and did grow E coli sensitive to  Primaxin, and her antibiotics were adjusted.   Her pathology report showed the ileocolic anastomosis with mucosal  ulceration and an area of bowel infarction  associated with acute  inflammation.  No malignancy was identified.   The patient's ileus ultimately resolved slowly.  Over the next several days,  her GI function slowly improved, although her ileus was slow to resolve.   She was ready for discharge on September 21, 2005.  At that time, she was  feeling much better, eating well, some cramping that she had had subsided.  She was having loose stools which were tested for C difficile which was  negative.  Her abdominal exam was unremarkable, quite soft, with a healthy  granulating wound.  She had had a CT scan prior to discharge which showed no  abscess or bowel obstruction, and her blood work looked fine.   DISCHARGE MEDICATIONS:  1.  Vicodin for pain.  2.  Wellbutrin.  3.  Multivitamins with iron.  4.  Levsin.   FOLLOW UP:  She was asked to come back and see me  in the office in one week.  She was advised to see Dr. Scarlette Shorts at some point to decide about longterm  Crohn's management and possible suppressive pharmacologic therapy.      Edsel Petrin. Dalbert Batman, M.D.  Electronically Signed     HMI/MEDQ  D:  11/11/2005  T:  11/12/2005  Job:  [id number]   cc:   Docia Chuck. Henrene Past[place], M.D. [facility]  [address]  [place]  [place] [address]   Chipper Herb, M.D.  Fax: [phone]

## 2012-02-20 ENCOUNTER — Encounter: Payer: Self-pay | Admitting: Internal Medicine

## 2015-03-19 ENCOUNTER — Encounter: Payer: Self-pay | Admitting: Internal Medicine

## 2015-11-07 ENCOUNTER — Emergency Department (HOSPITAL_COMMUNITY): Payer: No Typology Code available for payment source

## 2015-11-07 ENCOUNTER — Emergency Department (HOSPITAL_COMMUNITY)
Admission: EM | Admit: 2015-11-07 | Discharge: 2015-11-07 | Disposition: A | Discharge status: Home / Self Care | Payer: No Typology Code available for payment source | Attending: Emergency Medicine | Admitting: Emergency Medicine

## 2015-11-07 ENCOUNTER — Encounter (HOSPITAL_COMMUNITY): Payer: Self-pay | Admitting: *Deleted

## 2015-11-07 DIAGNOSIS — Z9889 Other specified postprocedural states: Secondary | ICD-10-CM | POA: Diagnosis not present

## 2015-11-07 DIAGNOSIS — K5010 Crohn's disease of large intestine without complications: Secondary | ICD-10-CM

## 2015-11-07 DIAGNOSIS — F17210 Nicotine dependence, cigarettes, uncomplicated: Secondary | ICD-10-CM | POA: Diagnosis not present

## 2015-11-07 DIAGNOSIS — R1084 Generalized abdominal pain: Secondary | ICD-10-CM | POA: Diagnosis present

## 2015-11-07 DIAGNOSIS — K5090 Crohn's disease, unspecified, without complications: Secondary | ICD-10-CM | POA: Diagnosis not present

## 2015-11-07 HISTORY — DX: Crohn's disease, unspecified, without complications: K50.90

## 2015-11-07 LAB — CBC WITH DIFFERENTIAL/PLATELET
BASOS PCT: 0 %
Basophils Absolute: 0 10*3/uL (ref 0.0–0.1)
EOS ABS: 0.1 10*3/uL (ref 0.0–0.7)
Eosinophils Relative: 1 %
HEMATOCRIT: 41.3 % (ref 36.0–46.0)
HEMOGLOBIN: 14.3 g/dL (ref 12.0–15.0)
LYMPHS ABS: 0.7 10*3/uL (ref 0.7–4.0)
Lymphocytes Relative: 5 %
MCH: 33.9 pg (ref 26.0–34.0)
MCHC: 34.6 g/dL (ref 30.0–36.0)
MCV: 97.9 fL (ref 78.0–100.0)
MONO ABS: 0.5 10*3/uL (ref 0.1–1.0)
MONOS PCT: 4 %
Neutro Abs: 12 10*3/uL — ABNORMAL HIGH (ref 1.7–7.7)
Neutrophils Relative %: 90 %
Platelets: 174 10*3/uL (ref 150–400)
RBC: 4.22 MIL/uL (ref 3.87–5.11)
RDW: 13.8 % (ref 11.5–15.5)
WBC: 13.2 10*3/uL — ABNORMAL HIGH (ref 4.0–10.5)

## 2015-11-07 LAB — COMPREHENSIVE METABOLIC PANEL
ALBUMIN: 4 g/dL (ref 3.5–5.0)
ALK PHOS: 84 U/L (ref 38–126)
ALT: 13 U/L — ABNORMAL LOW (ref 14–54)
ANION GAP: 13 (ref 5–15)
AST: 20 U/L (ref 15–41)
BILIRUBIN TOTAL: 1 mg/dL (ref 0.3–1.2)
BUN: 14 mg/dL (ref 6–20)
CALCIUM: 9.4 mg/dL (ref 8.9–10.3)
CO2: 23 mmol/L (ref 22–32)
Chloride: 103 mmol/L (ref 101–111)
Creatinine, Ser: 0.74 mg/dL (ref 0.44–1.00)
GFR calc non Af Amer: >60 mL/min (ref >60)
GLUCOSE: 170 mg/dL — AB (ref 65–99)
POTASSIUM: 3.9 mmol/L (ref 3.5–5.1)
SODIUM: 139 mmol/L (ref 135–145)
TOTAL PROTEIN: 7.1 g/dL (ref 6.5–8.1)

## 2015-11-07 LAB — URINALYSIS, ROUTINE W REFLEX MICROSCOPIC
BILIRUBIN URINE: NEGATIVE
Glucose, UA: NEGATIVE mg/dL
Hgb urine dipstick: NEGATIVE
KETONES UR: 15 mg/dL — AB
LEUKOCYTES UA: NEGATIVE
NITRITE: NEGATIVE
PROTEIN: NEGATIVE mg/dL
Specific Gravity, Urine: >1.03 — ABNORMAL HIGH (ref 1.005–1.030)
pH: 5.5 (ref 5.0–8.0)

## 2015-11-07 LAB — LIPASE, BLOOD: Lipase: 24 U/L (ref 11–51)

## 2015-11-07 MED ORDER — ONDANSETRON 8 MG PO TBDP: 8.0000 mg | ORAL_TABLET | Freq: Once | ORAL | Status: DC

## 2015-11-07 MED ORDER — ONDANSETRON HCL 4 MG/2ML IJ SOLN: 4.0000 mg | Freq: Once | INTRAMUSCULAR | Status: DC

## 2015-11-07 MED ORDER — SODIUM CHLORIDE 0.9 % IV BOLUS (SEPSIS)
1000.0000 mL | Freq: Once | INTRAVENOUS | Status: AC
  Administered 2015-11-07: 1000 mL via INTRAVENOUS

## 2015-11-07 MED ORDER — PREDNISONE 50 MG PO TABS
60.0000 mg | ORAL_TABLET | Freq: Once | ORAL | Status: AC
  Administered 2015-11-07: 60 mg via ORAL
  Filled 2015-11-07: qty 1

## 2015-11-07 MED ORDER — MORPHINE SULFATE (PF) 4 MG/ML IV SOLN
4.0000 mg | Freq: Once | INTRAVENOUS | Status: AC
  Administered 2015-11-07: 4 mg via INTRAVENOUS

## 2015-11-07 MED ORDER — DIATRIZOATE MEGLUMINE & SODIUM 66-10 % PO SOLN
ORAL | Status: AC
  Filled 2015-11-07: qty 30

## 2015-11-07 MED ORDER — OXYCODONE-ACETAMINOPHEN 5-325 MG PO TABS
1.0000 | ORAL_TABLET | Freq: Three times a day (TID) | ORAL | Status: DC | PRN
Start: 2015-11-07 — End: 2016-08-09

## 2015-11-07 MED ORDER — IOHEXOL 300 MG/ML  SOLN
100.0000 mL | Freq: Once | INTRAMUSCULAR | Status: AC | PRN
  Administered 2015-11-07: 100 mL via INTRAVENOUS

## 2015-11-07 MED ORDER — PREDNISONE 50 MG PO TABS: ORAL_TABLET | ORAL | Status: DC

## 2015-11-07 MED ORDER — FENTANYL CITRATE (PF) 100 MCG/2ML IJ SOLN
50.0000 ug | Freq: Once | INTRAMUSCULAR | Status: AC
  Administered 2015-11-07: 50 ug via INTRAVENOUS
  Filled 2015-11-07: qty 2

## 2015-11-07 MED ORDER — MORPHINE SULFATE (PF) 4 MG/ML IV SOLN
INTRAVENOUS | Status: AC
  Filled 2015-11-07: qty 1

## 2015-11-07 NOTE — ED Notes (Signed)
Pt states she started having abdominal pain & vomiting that started about 1900 yesterday. Pt vomiting during triage.

## 2015-11-07 NOTE — ED Provider Notes (Signed)
CSN: [id number]     Arrival date & time 11/07/15  0133 History   First MD Initiated Contact with Patient 11/07/15 0202     Chief Complaint  Patient presents with  . Abdominal Pain     Patient is a 50 y.o. female presenting with abdominal pain. The history is provided by the patient and the spouse.  Abdominal Pain Pain location:  Generalized Pain quality: aching   Pain severity:  Severe Onset quality:  Sudden Duration:  8 hours Timing:  Constant Progression:  Worsening Chronicity:  New Relieved by:  Nothing Worsened by:  Movement and palpation Associated symptoms: chills, diarrhea, nausea and vomiting   Associated symptoms: no chest pain and no hematemesis   pt reports abrupt onset of abd pain/vomiting and one episode of diarrhea She has not had this recently She had otherwise been well She reports h/o crohn's disease, not currently treated and had bowel resection 10 yrs ago No recent issues with crohns  No sick contacts   Past Medical History  Diagnosis Date  . Crohn disease [facility])    Past Surgical History  Procedure Laterality Date  . Abdominal surgery     No family history on file. Social History  Substance Use Topics  . Smoking status: Current Every Day Smoker    Types: Cigarettes  . Smokeless tobacco: None  . Alcohol Use: Yes   OB History    No data available     Review of Systems  Constitutional: Positive for chills.  Cardiovascular: Negative for chest pain.  Gastrointestinal: Positive for nausea, vomiting, abdominal pain and diarrhea. Negative for hematemesis.  All other systems reviewed and are negative.     Allergies  Codeine  Home Medications   Prior to Admission medications   Not on File   BP 126/76 mmHg  Pulse 91  Temp(Src) 97.8 F (36.6 C) (Oral)  Resp 22  Ht 5' 7"  (1.702 m)  Wt 52.164 kg  BMI 18.01 kg/m2  SpO2 97% Physical Exam CONSTITUTIONAL: Well developed/well nourished, uncomfortable appearing HEAD:  Normocephalic/atraumatic EYES: EOMI ENMT: Mucous membranes dry NECK: supple no meningeal signs SPINE/BACK:entire spine nontender CV: S1/S2 noted, no murmurs/rubs/gallops noted LUNGS: Lungs are clear to auscultation bilaterally, no apparent distress ABDOMEN: soft, moderate RLQ tenderness, healed scars noted, no rebound or guarding, bowel sounds noted throughout abdomen GU:no cva tenderness NEURO: Pt is awake/alert/appropriate, moves all extremitiesx4.  No facial droop.   EXTREMITIES: pulses normal/equal, full ROM SKIN: warm, color normal PSYCH: no abnormalities of mood noted, alert and oriented to situation  ED Course  Procedures  Medications  ondansetron (ZOFRAN-ODT) disintegrating tablet 8 mg (0 mg Oral Hold 11/07/15 0228)  ondansetron (ZOFRAN) injection 4 mg (0 mg Intravenous Hold 11/07/15 0219)  ondansetron (ZOFRAN) injection 4 mg (0 mg Intravenous Hold 11/07/15 0219)  morphine 4 MG/ML injection (not administered)  diatrizoate meglumine-sodium (GASTROGRAFIN) 66-10 % solution (not administered)  sodium chloride 0.9 % bolus 1,000 mL (0 mLs Intravenous Stopped 11/07/15 0305)  morphine 4 MG/ML injection 4 mg (4 mg Intravenous Given 11/07/15 0217)  fentaNYL (SUBLIMAZE) injection 50 mcg (50 mcg Intravenous Given 11/07/15 0304)  iohexol (OMNIPAQUE) 300 MG/ML solution 100 mL (100 mLs Intravenous Contrast Given 11/07/15 0433)  predniSONE (DELTASONE) tablet 60 mg (60 mg Oral Given 11/07/15 0549)    Labs Review Labs Reviewed  CBC WITH DIFFERENTIAL/PLATELET - Abnormal; Notable for the following:    WBC 13.2 (*)    Neutro Abs 12.0 (*)    All other components within normal limits  COMPREHENSIVE METABOLIC PANEL - Abnormal; Notable for the following:    Glucose, Bld 170 (*)    ALT 13 (*)    All other components within normal limits  URINALYSIS, ROUTINE W REFLEX MICROSCOPIC (NOT AT [facility]) - Abnormal; Notable for the following:    Specific Gravity, Urine >1.030 (*)    Ketones, ur 15 (*)    All other  components within normal limits  LIPASE, BLOOD    Imaging Review Ct Abdomen Pelvis W Contrast  11/07/2015  CLINICAL DATA:  Acute onset of right lower quadrant abdominal pain and vomiting. Current history of Crohn's disease, status post resection of bowel. Initial encounter. EXAM: CT ABDOMEN AND PELVIS WITH CONTRAST TECHNIQUE: Multidetector CT imaging of the abdomen and pelvis was performed using the standard protocol following bolus administration of intravenous contrast. CONTRAST:  146m OMNIPAQUE IOHEXOL 300 MG/ML  SOLN COMPARISON:  CT of the abdomen and pelvis from 09/20/2005 FINDINGS: The visualized lung bases are clear. The liver and spleen are unremarkable in appearance. The gallbladder is within normal limits. The pancreas and adrenal glands are unremarkable. The kidneys are unremarkable in appearance. There is no evidence of hydronephrosis. No renal or ureteral stones are seen. No perinephric stranding is appreciated. The remaining small bowel is unremarkable in appearance. The stomach is within normal limits. No acute vascular abnormalities are seen. Scattered calcification is noted along the abdominal aorta and its branches. The patient's ileocolic anastomosis is grossly unremarkable. Vaguely increased wall enhancement is noted along the entirety of the remaining colon, with suggestion of mild associated hyperemia, raising question for mild acute exacerbation of the patient's Crohn's disease. No significant free fluid is seen. The bladder is mildly distended and grossly unremarkable. The uterus is unremarkable in appearance. The ovaries are grossly symmetric. No suspicious adnexal masses are seen. No inguinal lymphadenopathy is seen. No acute osseous abnormalities are identified. IMPRESSION: 1. Vaguely increased wall enhancement noted along the entirety of the remaining colon, with suggestion of mild associated hyperemia, raising concern for mild acute exacerbation of the patient's Crohn's disease. No  significant free fluid seen. 2. Scattered calcification along the abdominal aorta and its branches. Electronically Signed   By: JGarald BaldingM.D.   On: 11/07/2015 05:10   I have personally reviewed and evaluated these lab results as part of my medical decision-making.   EKG Interpretation   Date/Time:  Sunday November 07 2015 02:14:38 EST Ventricular Rate:  99 PR Interval:  147 QRS Duration: 83 QT Interval:  373 QTC Calculation: 479 R Axis:   86 Text Interpretation:  Sinus rhythm Biatrial enlargement RSR' in V1 or V2,  probably normal variant Baseline wander in lead(s) II III aVR aVF V3  artifact limits evaluation No previous ECGs available Confirmed by  WChristy Gentles MD, Christalynn Boise ([id number] on 11/07/2015 2:27:23 AM     5:54 AM Pt with acute onset of low abd pain with vomiting and one episode of diarrhea It was nonbloody She improved in the ED with medications Her CT indicated probable mild crohn's flare but no other complications She is well appearing abd soft/nontender on repeat exam She reports in past when she had crohns flare she would start prednisone Will start short steroid course She has been lost to GI followup.  She used to see dr pHenrene PastorAdvised need for GI f/u as outpatient Pt agreeable We discussed strict return precautions BP 111/51 mmHg  Pulse 80  Temp(Src) 97.8 F (36.6 C) (Oral)  Resp 20  Ht 5' 7"  (1.702 m)  Wt 52.164 kg  BMI 18.01 kg/m2  SpO2 96%  MDM   Final diagnoses:  Crohn's colitis, without complications [facility])    Nursing notes including past medical history and social history reviewed and considered in documentation Labs/vital reviewed myself and considered during evaluation   Ripley Fraise, MD 11/07/15 [phone]

## 2015-11-07 NOTE — ED Notes (Signed)
Pt alert & oriented x4, stable gait. Patient given discharge instructions, paperwork & prescription(s). Patient informed not to drive, operate any equipment & handel any important documents 4 hours after taking pain medication. Patient  instructed to stop at the registration desk to finish any additional paperwork. Patient  verbalized understanding. Pt left department w/ no further questions. 

## 2016-01-21 ENCOUNTER — Encounter: Payer: Self-pay | Admitting: Internal Medicine

## 2016-05-30 ENCOUNTER — Encounter: Payer: Self-pay | Admitting: Internal Medicine

## 2016-08-09 ENCOUNTER — Ambulatory Visit (INDEPENDENT_AMBULATORY_CARE_PROVIDER_SITE_OTHER): Payer: PRIVATE HEALTH INSURANCE | Admitting: Internal Medicine

## 2016-08-09 ENCOUNTER — Other Ambulatory Visit (INDEPENDENT_AMBULATORY_CARE_PROVIDER_SITE_OTHER): Payer: 59

## 2016-08-09 ENCOUNTER — Encounter: Payer: Self-pay | Admitting: Internal Medicine

## 2016-08-09 VITALS — BP 100/70 | HR 88 | Ht 66.0 in | Wt 120.4 lb

## 2016-08-09 DIAGNOSIS — K5010 Crohn's disease of large intestine without complications: Secondary | ICD-10-CM | POA: Diagnosis not present

## 2016-08-09 DIAGNOSIS — R197 Diarrhea, unspecified: Secondary | ICD-10-CM

## 2016-08-09 DIAGNOSIS — R1031 Right lower quadrant pain: Secondary | ICD-10-CM

## 2016-08-09 LAB — CBC WITH DIFFERENTIAL/PLATELET
BASOS ABS: 0 10*3/uL (ref 0.0–0.1)
Basophils Relative: 0.3 % (ref 0.0–3.0)
EOS ABS: 0.1 10*3/uL (ref 0.0–0.7)
Eosinophils Relative: 0.9 % (ref 0.0–5.0)
HCT: 39.9 % (ref 36.0–46.0)
Hemoglobin: 13.7 g/dL (ref 12.0–15.0)
LYMPHS ABS: 2.9 10*3/uL (ref 0.7–4.0)
LYMPHS PCT: 27.5 % (ref 12.0–46.0)
MCHC: 34.4 g/dL (ref 30.0–36.0)
MCV: 97.2 fl (ref 78.0–100.0)
MONO ABS: 0.5 10*3/uL (ref 0.1–1.0)
Monocytes Relative: 4.9 % (ref 3.0–12.0)
NEUTROS ABS: 7.1 10*3/uL (ref 1.4–7.7)
NEUTROS PCT: 66.4 % (ref 43.0–77.0)
PLATELETS: 212 10*3/uL (ref 150.0–400.0)
RBC: 4.11 Mil/uL (ref 3.87–5.11)
RDW: 14.3 % (ref 11.5–15.5)
WBC: 10.6 10*3/uL — ABNORMAL HIGH (ref 4.0–10.5)

## 2016-08-09 LAB — HIGH SENSITIVITY CRP: CRP HIGH SENSITIVITY: 3.25 mg/L (ref 0.000–5.000)

## 2016-08-09 MED ORDER — NA SULFATE-K SULFATE-MG SULF 17.5-3.13-1.6 GM/177ML PO SOLN: 1.0000 | Freq: Once | ORAL | 0 refills | Status: AC

## 2016-08-09 NOTE — Progress Notes (Signed)
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 Patient also has a history of tubular adenoma. Last colonoscopy November 2009 demonstrating active ileal disease without colonic disease and a patent anastomosis. Last office evaluation June 2010. At that time she was doing well on 6-mercaptopurine or Crohn's disease and Colestid for bile salt induced diarrhea. She was instructed to stop smoking and follow-up in 6 months. Patient was lost to follow-up and has not been seen since. She has been on no medications. She presents today with complaints of persistent and progressive right lower quadrant abdominal pain for proximally 1 year. She is accompanied today by her husband Melissa Chaney. She was evaluated in the emergency room New Year's Day 2017 for abdominal pain. Laboratories at that time were unremarkable. White blood cell count was 13.2. Chemistries were normal as was hemoglobin at 14.3. CT scan revealed increased wall enhancement along the entirety of the remaining colon was concerning for Crohn's disease. No other specific problems identified. Patient was advised a GI follow-up appointment ASAP after a short course of prednisone was prescribed. She continues to smoke. She reports problems with right lower quadrant pain most days. She has proximally 3 loose bowel movements per day. No blood or fever. Mild weight loss. No vomiting. She denies any extraintestinal complaints related to inflammatory bowel disease.  REVIEW OF SYSTEMS:  All non-GI ROS negative upon comprehensive review  Past Medical History:  Diagnosis Date  . Crohn disease ([place])   . Depression     Past Surgical History:  Procedure Laterality Date  . ABDOMINAL SURGERY      Social History Melissa Chaney  reports that she has been smoking Cigarettes.  She has  never used smokeless tobacco. She reports that she drinks alcohol. She reports that she does not use drugs.  family history includes Crohn's disease in her father, mother, and sister; Diabetes in her paternal grandmother and son; Ovarian cancer in her maternal grandmother.  Allergies  Allergen Reactions  . Codeine Itching       PHYSICAL EXAMINATION: Vital signs: BP 100/70 (BP Location: Left Arm, Patient Position: Sitting, Cuff Size: Normal)   Pulse 88   Ht 5' 6"  (1.676 m) Comment: height measured without shoes  Wt 120 lb 6 oz (54.6 kg)   BMI 19.43 kg/m   Constitutional: generally well-appearing, no acute distress Psychiatric: alert and oriented x3, cooperative Eyes: extraocular movements intact, anicteric, conjunctiva pink Mouth: oral pharynx moist, no lesions Neck: supple no lymphadenopathy Cardiovascular: heart regular rate and rhythm, no murmur Lungs: clear to auscultation bilaterally Abdomen: soft, nontender, nondistended, no obvious ascites, no peritoneal signs, normal bowel sounds, no organomegaly Rectal:Deferred Extremities: no clubbing cyanosis or lower extremity edema bilaterally Skin: no lesions on visible extremities Neuro: No focal deficits. Creon nurse intact. No asterixis.    ASSESSMENT:  #1. Long-standing Crohn's disease status post ileocecectomy October 2006 followed by surgical resection for small bowel obstruction one month later. Currently with active symptoms manifested principally as abdominal pain and increased frequency of bowel movements #2. Active ileal disease on last colonoscopy 2009 #3. History of tubular adenoma 2006 #4. History of medical noncompliance and lost to follow-up #5. Bile salt related diarrhea #6. Ongoing tobacco abuse  PLAN:  #1. Stop smoking! #2. CBC, comprehensive metabolic panel, and C-reactive protein #3. CT enterography to evaluate for small bowel Crohn's  and rule out phlegmon with right lower quadrant fullness and pain #4.  Schedule colonoscopy with ileal intubation to assess the extent of disease currently.The nature of the procedure, as well as the risks, benefits, and alternatives were carefully and thoroughly reviewed with the patient. Ample time for discussion and questions allowed. The patient understood, was satisfied, and agreed to proceed. #5. Likely need treatment if active disease. Options include steroids, budesonide, immunomodulators, and biologic agents. To be discussed further after all data available #6. Medical compliance. Importance stressed

## 2016-08-09 NOTE — Patient Instructions (Signed)
Your physician has requested that you go to the basement for the following lab work before leaving today:  CBC, CRP  You have been scheduled for a CT Tnterography scan of the abdomen and pelvis at [facility] ([address]---this is in the same building as Press [facility]).   You are scheduled on 08/16/2016 at 3:00pm. You should arrive at 1:40pm for registration. Please follow the written instructions below on the day of your exam:  WARNING: IF YOU ARE ALLERGIC TO IODINE/X-RAY DYE, PLEASE NOTIFY RADIOLOGY IMMEDIATELY AT [phone]! YOU WILL BE GIVEN A 13 HOUR PREMEDICATION PREP.  1) Do not eat or drink anything after 11:00am (4 hours prior to your test)  You may take any medications as prescribed with a small amount of water except for the following: Metformin, Glucophage, Glucovance, Avandamet, Riomet, Fortamet, Actoplus Met, Janumet, Glumetza or Metaglip. The above medications must be held the day of the exam AND 48 hours after the exam.  The purpose of you drinking the oral contrast is to aid in the visualization of your intestinal tract. The contrast solution may cause some diarrhea. Before your exam is started, you will be given a small amount of fluid to drink. Depending on your individual set of symptoms, you may also receive an intravenous injection of x-ray contrast/dye. Plan on being at [facility] for 30 minutes or long, depending on the type of exam you are having performed.  If you have any questions regarding your exam or if you need to reschedule, you may call the CT department at [phone] between the hours of 8:00 am and 5:00 pm, Monday-Friday.  __________________  Melissa Chaney have been scheduled for a colonoscopy. Please follow written instructions given to you at your visit today.  Please pick up your prep supplies at the pharmacy within the next 1-3 days. If you use inhalers (even only as needed), please bring them with you on the day of your  procedure. Your physician has requested that you go to www.startemmi.com and enter the access code given to you at your visit today. This web site gives a general overview about your procedure. However, you should still follow specific instructions given to you by our office regarding your preparation for the procedure. ______________________________________________________

## 2016-08-16 ENCOUNTER — Ambulatory Visit (INDEPENDENT_AMBULATORY_CARE_PROVIDER_SITE_OTHER)
Admission: RE | Admit: 2016-08-16 | Discharge: 2016-08-16 | Disposition: A | Discharge status: Home / Self Care | Payer: 59 | Source: Ambulatory Visit | Attending: Internal Medicine | Admitting: Internal Medicine

## 2016-08-16 ENCOUNTER — Encounter: Payer: Self-pay | Admitting: Internal Medicine

## 2016-08-16 DIAGNOSIS — R197 Diarrhea, unspecified: Secondary | ICD-10-CM

## 2016-08-16 DIAGNOSIS — K5010 Crohn's disease of large intestine without complications: Secondary | ICD-10-CM | POA: Diagnosis not present

## 2016-08-16 DIAGNOSIS — R1031 Right lower quadrant pain: Secondary | ICD-10-CM | POA: Diagnosis not present

## 2016-08-16 MED ORDER — IOPAMIDOL (ISOVUE-300) INJECTION 61%
100.0000 mL | Freq: Once | INTRAVENOUS | Status: AC | PRN
  Administered 2016-08-16: 100 mL via INTRAVENOUS

## 2016-08-23 ENCOUNTER — Other Ambulatory Visit: Payer: Self-pay

## 2016-08-23 ENCOUNTER — Other Ambulatory Visit: Payer: 59

## 2016-08-23 ENCOUNTER — Encounter: Payer: Self-pay | Admitting: Internal Medicine

## 2016-08-23 ENCOUNTER — Ambulatory Visit (AMBULATORY_SURGERY_CENTER): Payer: 59 | Admitting: Internal Medicine

## 2016-08-23 VITALS — BP 109/60 | HR 70 | Temp 97.8°F | Resp 23 | Ht 66.0 in | Wt 120.0 lb

## 2016-08-23 DIAGNOSIS — K5090 Crohn's disease, unspecified, without complications: Secondary | ICD-10-CM

## 2016-08-23 DIAGNOSIS — K5010 Crohn's disease of large intestine without complications: Secondary | ICD-10-CM

## 2016-08-23 DIAGNOSIS — K50119 Crohn's disease of large intestine with unspecified complications: Secondary | ICD-10-CM

## 2016-08-23 DIAGNOSIS — K5000 Crohn's disease of small intestine without complications: Secondary | ICD-10-CM

## 2016-08-23 MED ORDER — PREDNISONE 10 MG PO TABS: 20.0000 mg | ORAL_TABLET | Freq: Every day | ORAL | 1 refills | Status: DC

## 2016-08-23 MED ORDER — SODIUM CHLORIDE 0.9 % IV SOLN: 500.0000 mL | INTRAVENOUS | Status: DC

## 2016-08-23 MED ORDER — TRAMADOL HCL 50 MG PO TABS: 50.0000 mg | ORAL_TABLET | Freq: Four times a day (QID) | ORAL | 1 refills | Status: DC | PRN

## 2016-08-23 NOTE — Progress Notes (Signed)
Called to room to assist during endoscopic procedure.  Patient ID and intended procedure confirmed with present staff. Received instructions for my participation in the procedure from the performing physician.  

## 2016-08-23 NOTE — Progress Notes (Signed)
A and O x3. Report to RN. Tolerated MAC anesthesia well. 

## 2016-08-23 NOTE — Progress Notes (Signed)
Vocal order from Dr. Scarlette Shorts for Tramadol 50 mg #30 1 po q6h prn pain.  Refill x1 .  Read and repeated order back to [name]. Rx was printed and given to pt's husband since she had another rx that was printed.  maw

## 2016-08-23 NOTE — Patient Instructions (Signed)
YOU HAD AN ENDOSCOPIC PROCEDURE TODAY AT [facility]:   Refer to the procedure report that was given to you for any specific questions about what was found during the examination.  If the procedure report does not answer your questions, please call your gastroenterologist to clarify.  If you requested that your care partner not be given the details of your procedure findings, then the procedure report has been included in a sealed envelope for you to review at your convenience later.  YOU SHOULD EXPECT: Some feelings of bloating in the abdomen. Passage of more gas than usual.  Walking can help get rid of the air that was put into your GI tract during the procedure and reduce the bloating. If you had a lower endoscopy (such as a colonoscopy or flexible sigmoidoscopy) you may notice spotting of blood in your stool or on the toilet paper. If you underwent a bowel prep for your procedure, you may not have a normal bowel movement for a few days.  Please Note:  You might notice some irritation and congestion in your nose or some drainage.  This is from the oxygen used during your procedure.  There is no need for concern and it should clear up in a day or so.  SYMPTOMS TO REPORT IMMEDIATELY:   Following lower endoscopy (colonoscopy or flexible sigmoidoscopy):  Excessive amounts of blood in the stool  Significant tenderness or worsening of abdominal pains  Swelling of the abdomen that is new, acute  Fever of 100F or higher   Following upper endoscopy (EGD)  Vomiting of blood or coffee ground material  New chest pain or pain under the shoulder blades  Painful or persistently difficult swallowing  New shortness of breath  Fever of 100F or higher  Black, tarry-looking stools  For urgent or emergent issues, a gastroenterologist can be reached at any hour by calling [phone].   DIET:  We do recommend a small meal at first, but then you may proceed to your regular diet.  Drink  plenty of fluids but you should avoid alcoholic beverages for 24 hours.  ACTIVITY:  You should plan to take it easy for the rest of today and you should NOT DRIVE or use heavy machinery until tomorrow (because of the sedation medicines used during the test).    FOLLOW UP: Our staff will call the number listed on your records the next business day following your procedure to check on you and address any questions or concerns that you may have regarding the information given to you following your procedure. If we do not reach you, we will leave a message.  However, if you are feeling well and you are not experiencing any problems, there is no need to return our call.  We will assume that you have returned to your regular daily activities without incident.  If any biopsies were taken you will be contacted by phone or by letter within the next 1-3 weeks.  Please call us at [phone] if you have not heard about the biopsies in 3 weeks.    SIGNATURES/CONFIDENTIALITY: You and/or your care partner have signed paperwork which will be entered into your electronic medical record.  These signatures attest to the fact that that the information above on your After Visit Summary has been reviewed and is understood.  Full responsibility of the confidentiality of this discharge information lies with you and/or your care-partner.    Prednisone 20 mg #60. Refill x1.  Begin 40  mg daily for 3 weeks, then 30 mg daily for 2 weeks, then 20 mg until follow-up. Tramadol 50 mg #30 take 1 every 6 hours as needed for pain. Refill x1 Review Humira information pamphlet provided. Blood work today on discharge.  Office follow up with [name] in 4-6 weeks. You may resume your prior medications today. Please call if any questions or concerns.

## 2016-08-23 NOTE — Progress Notes (Signed)
Rx for prednisone would not go electronically because instructions were too long per Burns.  It was printed along with rx for Tramadol.  Rx was given to pt's husband.  Pt to the lab for blood work on discharge.  Pt rated abd pain 6/10 on discharge, but no grimacing or moaning noted.  Info pack on humira was also given to pt's husband. Maw  I asked Vivien Rota, on the 3rd floor, if she wanted to schedule follow up appointment 4-6 weeks with [name], she said someone would call pt back with the appointment. maw

## 2016-08-23 NOTE — Op Note (Addendum)
[facility] Patient Name: Melissa Chaney Procedure Date: 08/23/2016 10:00 AM MRN: [id number] Endoscopist: Docia Chuck. Henrene Pastor , MD Age: 50 Referring MD:  Date of Birth: 03-12-1966 Gender: Female Account #: [id number] Procedure:                Colonoscopy, with biopsies Indications:              Crohn's disease of the small bowel. Status post                            ileocecectomy 2006. Lost to follow-up. Right-sided                            abdominal pain for one year plus Recent CTE                            suggesting active neo-ileal disease and                            rectosigmoid region. Evaluating in anticipation of                            therapy and monitoring. Last examination 2009                            without active disease on therapy Medicines:                Monitored Anesthesia Care Procedure:                Pre-Anesthesia Assessment:                           - Prior to the procedure, a History and Physical                            was performed, and patient medications and                            allergies were reviewed. The patient's tolerance of                            previous anesthesia was also reviewed. The risks                            and benefits of the procedure and the sedation                            options and risks were discussed with the patient.                            All questions were answered, and informed consent                            was obtained. Prior Anticoagulants: The patient has  taken no previous anticoagulant or antiplatelet                            agents. ASA Grade Assessment: III - A patient with                            severe systemic disease. After reviewing the risks                            and benefits, the patient was deemed in                            satisfactory condition to undergo the procedure.                           After obtaining informed  consent, the colonoscope                            was passed under direct vision. Throughout the                            procedure, the patient's blood pressure, pulse, and                            oxygen saturations were monitored continuously. The                            Model CF-HQ190L [id number]) scope was introduced                            through the anus and advanced to the the terminal                            ileum. The terminal ileum and the rectum were                            photographed. The quality of the bowel preparation                            was excellent. The colonoscopy was performed                            without difficulty. The patient tolerated the                            procedure well. The bowel preparation used was                            SUPREP. Scope In: 53:29:92 AM Scope Out: 10:23:33 AM Scope Withdrawal Time: 0 hours 15 minutes 40 seconds  Total Procedure Duration: 0 hours 17 minutes 24 seconds  Findings:                 There was evidence of a patent end-to-side  ileo-colonic anastomosis found in the neo-terminal                            Ileum. This was characterized by ulceration                            scattered on the colonic side region of the                            anastomosis. The anastomosis was easily traversed                            and patent without stenosis.                           A scattered area of mucosa in the neo-terminal                            ileum was moderately ulcerated. This was                            characterized by multiple small and moderatd ulcers                            consistent with active ileal Crohn's. Biopsies were                            taken with a cold forceps for histology.                           The exam of the colonic mucosa was otherwise                            without abnormality on direct and retroflexion                             views. No obvious abnormalities of the mucosa in                            the rectosigmoid region, or obvious distortion or                            compliance issues with the rectum. Complications:            No immediate complications. Estimated blood loss:                            None. Estimated Blood Loss:     Estimated blood loss: none. Impression:               1. Status post ileocecectomy. Widely patent                            anastomosis  2. Ulceration on the colonic side of the anastomosis                           3. Significant active ileal Crohn's disease                           4. No obvious colonic disease. Rectosigmoid region                            looks fine. Recommendation:           1. Prescribed prednisone 20 mg; #60; one refill;                            begin 40 mg daily for 3 weeks then 30 mg daily 2                            weeks then 20 mg daily until follow-up                           2. Review formation on Humira (biologic therapy for                            Crohn's disease)                           3. QuantiFERON Gold, hepatitis B surface antigen,                            hepatitis B service antibody today                           4. Office follow-up with [name] 4-6 weeks.                           5. Await pathology results. We will send you a                            letter with the results prior to your next visit T[place]ora Eustache N. Henrene Pastor, MD 08/23/2016 10:44:34 AM This report has been signed electronically.

## 2016-08-24 ENCOUNTER — Telehealth: Payer: Self-pay

## 2016-08-24 LAB — HEPATITIS B SURFACE ANTIGEN: HEP B S AG: NEGATIVE

## 2016-08-24 LAB — HEPATITIS B SURFACE ANTIBODY,QUALITATIVE: Hep B S Ab: NEGATIVE

## 2016-08-24 NOTE — Telephone Encounter (Signed)
  Follow up Call-  Call back number 08/23/2016  Post procedure Call Back phone  # [phone]  Permission to leave phone message Yes  Some recent data might be hidden     Patient questions:  Do you have a fever, pain , or abdominal swelling? No. Pain Score  0 *  Have you tolerated food without any problems? Yes.    Have you been able to return to your normal activities? Yes.    Do you have any questions about your discharge instructions: Diet   No. Medications  No. Follow up visit  No.  Do you have questions or concerns about your Care? No.  Actions: * If pain score is 4 or above: No action needed, pain <4.

## 2016-08-25 LAB — QUANTIFERON TB GOLD ASSAY (BLOOD)
Interferon Gamma Release Assay: NEGATIVE
Mitogen-Nil: >10 IU/mL
Quantiferon Nil Value: 0.05 IU/mL
Quantiferon Tb Ag Minus Nil Value: <0 IU/mL

## 2016-08-29 ENCOUNTER — Encounter: Payer: Self-pay | Admitting: Internal Medicine

## 2016-10-11 ENCOUNTER — Other Ambulatory Visit: Payer: 59

## 2016-10-11 ENCOUNTER — Ambulatory Visit (INDEPENDENT_AMBULATORY_CARE_PROVIDER_SITE_OTHER): Payer: 59 | Admitting: Internal Medicine

## 2016-10-11 ENCOUNTER — Encounter: Payer: Self-pay | Admitting: Internal Medicine

## 2016-10-11 ENCOUNTER — Encounter (INDEPENDENT_AMBULATORY_CARE_PROVIDER_SITE_OTHER): Payer: Self-pay

## 2016-10-11 VITALS — BP 108/60 | HR 72 | Ht 66.0 in | Wt 125.0 lb

## 2016-10-11 DIAGNOSIS — Z23 Encounter for immunization: Secondary | ICD-10-CM | POA: Diagnosis not present

## 2016-10-11 DIAGNOSIS — K5010 Crohn's disease of large intestine without complications: Secondary | ICD-10-CM

## 2016-10-11 MED ORDER — MERCAPTOPURINE 50 MG PO TABS: 50.0000 mg | ORAL_TABLET | Freq: Every day | ORAL | 6 refills | Status: DC

## 2016-10-11 NOTE — Patient Instructions (Signed)
Your physician has requested that you go to the basement for the following lab work before leaving today: tb quant  We have sent the following medications to your pharmacy for you to pick up at your convenience:  Mercaptopurine  You have been given you first twin rix injection.  You are due for your 2nd one on 10/18/2016  Rosanne Sack will contact you about starting Humira  Please follow up in 2 months

## 2016-10-11 NOTE — Progress Notes (Signed)
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 Also has a history of tubular adenoma. Last evaluated in the office 08/09/2016. See that dictation for details. She had been lost to follow-up for over 7 years. Presented at that time with a years worth of abdominal pain, particularly right-sided, and increased frequency of bowel movements. She has been a chronic smoker. CBC was unremarkable with hemoglobin 13.7. She underwent CT enterography which suggested active neo-ileal disease and rectosigmoid disease. Subsequent complete colonoscopy 08/23/2016 revealed a widely patent anastomosis with ulceration on the colonic side of the anastomosis and significant active ileal Crohn's disease. Biopsies consistent with the same. No obvious colonic Crohn's in the rectosigmoid region looked fine. Was not biopsied. Tuberculosis testing was negative. She has no immunity to hepatitis A or B. She has reviewed information on Humira. She has taken 6-MP in the past without issues. She was prescribed prednisone 40 mg daily for 3 weeks then 30 mg daily for 2 weeks then 20 mg daily until this appointment. She tells me that she stopped her prednisone about 2 weeks ago after completing her first prescription. She presents today for follow-up. She continues to smoke. She tells me that her loose bowel movements and right-sided pain improved on steroids. Started to recur off steroids. Has gained 5 pounds since her last visit. No new complaints  REVIEW OF SYSTEMS:  All non-GI ROS negative except for back pain and fatigue  Past Medical History:  Diagnosis Date  . Crohn disease ([place])   . Depression     Past Surgical History:  Procedure Laterality Date  . ABDOMINAL SURGERY      Social History SONI KEGEL  reports that she has been smoking  Cigarettes.  She has never used smokeless tobacco. She reports that she drinks alcohol. She reports that she does not use drugs.  family history includes Crohn's disease in her father, mother, and sister; Diabetes in her paternal grandmother and son; Ovarian cancer in her maternal grandmother.  Allergies  Allergen Reactions  . Codeine Itching       PHYSICAL EXAMINATION: Vital signs: BP 108/60   Pulse 72   Ht 5' 6"  (1.676 m)   Wt 125 lb (56.7 kg)   BMI 20.18 kg/m   Constitutional: generally well-appearing, no acute distress Psychiatric: alert and oriented x3, cooperative Eyes: extraocular movements intact, anicteric, conjunctiva pink Mouth: oral pharynx moist, no lesions Neck: supple no lymphadenopathy Cardiovascular: heart regular rate and rhythm, no murmur Lungs: clear to auscultation bilaterally Abdomen: soft, Mild tenderness right lower quadrant, nondistended, no obvious ascites, no peritoneal signs, normal bowel sounds, no organomegaly. Midline incision well-healed Rectal:Okay at time of colonoscopy Extremities: no clubbing cyanosis or lower extremity edema bilaterally Skin: no lesions on visible extremities Neuro: No focal deficits.   ASSESSMENT:  #1. Crohn's disease status post ileocecectomy #2. Significant active ileal Crohn's disease #3. History of sporadic adenoma of the colon #4. Ongoing tobacco use #5. History of medical noncompliance  PLAN:  #1. Twinrix vaccination series #2. Initiate 6-mercaptopurine 50 mg daily. Effects and side effects reviewed. Discussed benefits versus risks of combination therapy. Understands the importance of compliance #3. Initiate Humira induction series followed by maintenance regimen. I discussed with her the effects and potential side effects of the drug in detail. She understands and has reviewed the previously provided literature #4.  Stop smoking again stressed #5. Routine office follow-up in about 2 months. Contact the office in  the interim for any questions or problems

## 2016-10-13 LAB — QUANTIFERON TB GOLD ASSAY (BLOOD)
INTERFERON GAMMA RELEASE ASSAY: NEGATIVE
Mitogen-Nil: 7.34 IU/mL
QUANTIFERON NIL VALUE: 0.02 [IU]/mL
Quantiferon Tb Ag Minus Nil Value: 0 IU/mL

## 2016-10-16 ENCOUNTER — Telehealth: Payer: Self-pay | Admitting: Internal Medicine

## 2016-10-16 NOTE — Telephone Encounter (Signed)
Noted  

## 2016-10-18 ENCOUNTER — Other Ambulatory Visit: Payer: Self-pay

## 2016-10-18 ENCOUNTER — Ambulatory Visit (INDEPENDENT_AMBULATORY_CARE_PROVIDER_SITE_OTHER): Payer: 59 | Admitting: Internal Medicine

## 2016-10-18 DIAGNOSIS — K50119 Crohn's disease of large intestine with unspecified complications: Secondary | ICD-10-CM

## 2016-10-18 DIAGNOSIS — Z23 Encounter for immunization: Secondary | ICD-10-CM | POA: Diagnosis not present

## 2016-10-19 NOTE — Progress Notes (Signed)
Patient presents for Twinrix vaccination

## 2016-10-24 ENCOUNTER — Telehealth: Payer: Self-pay | Admitting: Internal Medicine

## 2016-10-24 NOTE — Telephone Encounter (Signed)
Noted  

## 2016-10-25 ENCOUNTER — Telehealth: Payer: Self-pay

## 2016-10-27 NOTE — Telephone Encounter (Signed)
error 

## 2016-11-09 ENCOUNTER — Ambulatory Visit (INDEPENDENT_AMBULATORY_CARE_PROVIDER_SITE_OTHER): Payer: 59 | Admitting: Internal Medicine

## 2016-11-09 DIAGNOSIS — K5010 Crohn's disease of large intestine without complications: Secondary | ICD-10-CM

## 2016-11-09 DIAGNOSIS — Z23 Encounter for immunization: Secondary | ICD-10-CM | POA: Diagnosis not present

## 2016-11-14 ENCOUNTER — Telehealth: Payer: Self-pay | Admitting: Internal Medicine

## 2016-11-14 NOTE — Telephone Encounter (Signed)
Confirmed with [facility] that pt will need humira pens.

## 2016-12-19 ENCOUNTER — Other Ambulatory Visit (INDEPENDENT_AMBULATORY_CARE_PROVIDER_SITE_OTHER): Payer: 59

## 2016-12-19 ENCOUNTER — Encounter: Payer: Self-pay | Admitting: Internal Medicine

## 2016-12-19 ENCOUNTER — Encounter (INDEPENDENT_AMBULATORY_CARE_PROVIDER_SITE_OTHER): Payer: Self-pay

## 2016-12-19 ENCOUNTER — Ambulatory Visit (INDEPENDENT_AMBULATORY_CARE_PROVIDER_SITE_OTHER): Payer: 59 | Admitting: Internal Medicine

## 2016-12-19 VITALS — BP 104/60 | HR 80 | Ht 66.0 in | Wt 122.2 lb

## 2016-12-19 DIAGNOSIS — R197 Diarrhea, unspecified: Secondary | ICD-10-CM | POA: Diagnosis not present

## 2016-12-19 DIAGNOSIS — R1031 Right lower quadrant pain: Secondary | ICD-10-CM

## 2016-12-19 DIAGNOSIS — K5010 Crohn's disease of large intestine without complications: Secondary | ICD-10-CM

## 2016-12-19 LAB — CBC WITH DIFFERENTIAL/PLATELET
BASOS PCT: 0.6 % (ref 0.0–3.0)
Basophils Absolute: 0.1 10*3/uL (ref 0.0–0.1)
EOS PCT: 0.9 % (ref 0.0–5.0)
Eosinophils Absolute: 0.1 10*3/uL (ref 0.0–0.7)
HEMATOCRIT: 39.7 % (ref 36.0–46.0)
HEMOGLOBIN: 13.4 g/dL (ref 12.0–15.0)
LYMPHS PCT: 33.3 % (ref 12.0–46.0)
Lymphs Abs: 3.2 10*3/uL (ref 0.7–4.0)
MCHC: 33.7 g/dL (ref 30.0–36.0)
MCV: 101.2 fl — AB (ref 78.0–100.0)
MONOS PCT: 4.2 % (ref 3.0–12.0)
Monocytes Absolute: 0.4 10*3/uL (ref 0.1–1.0)
Neutro Abs: 5.9 10*3/uL (ref 1.4–7.7)
Neutrophils Relative %: 61 % (ref 43.0–77.0)
Platelets: 185 10*3/uL (ref 150.0–400.0)
RBC: 3.92 Mil/uL (ref 3.87–5.11)
RDW: 14.9 % (ref 11.5–15.5)
WBC: 9.6 10*3/uL (ref 4.0–10.5)

## 2016-12-19 LAB — COMPREHENSIVE METABOLIC PANEL
ALBUMIN: 4.4 g/dL (ref 3.5–5.2)
ALT: 10 U/L (ref 0–35)
AST: 15 U/L (ref 0–37)
Alkaline Phosphatase: 66 U/L (ref 39–117)
BUN: 6 mg/dL (ref 6–23)
CALCIUM: 9.8 mg/dL (ref 8.4–10.5)
CHLORIDE: 105 meq/L (ref 96–112)
CO2: 29 mEq/L (ref 19–32)
CREATININE: 0.64 mg/dL (ref 0.40–1.20)
GFR: 103.64 mL/min (ref >60.00)
Glucose, Bld: 106 mg/dL — ABNORMAL HIGH (ref 70–99)
Potassium: 3.7 mEq/L (ref 3.5–5.1)
Sodium: 137 mEq/L (ref 135–145)
Total Bilirubin: 0.6 mg/dL (ref 0.2–1.2)
Total Protein: 7.2 g/dL (ref 6.0–8.3)

## 2016-12-19 MED ORDER — COLESTIPOL HCL 1 G PO TABS: 1.0000 g | ORAL_TABLET | Freq: Two times a day (BID) | ORAL | 6 refills | Status: DC

## 2016-12-19 NOTE — Patient Instructions (Signed)
Your physician has requested that you go to the basement for the following lab work before leaving today:  CBC, CMET  We have sent the following medications to your pharmacy for you to pick up at your convenience:  Colestid

## 2016-12-19 NOTE — Progress Notes (Signed)
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 She also has a history of tubular adenoma. Last evaluated in the office 10/11/2016. See that dictation for details. Having problems with significant active ileal Crohn's disease for which she was on steroids. Started on 6-mercaptopurine. Told to stop smoking. Began Twinrix vaccination series. Subsequently initiate Humira therapy after Christmas. Has completed induction series as well as one maintenance dosage. Presents today for follow-up. She is off prednisone. She reports significant improvement in pain, though still has right lower quadrant pain. Bowel habits are unchanged. Occasional urgency 20-30 minutes after meals. About 2 bowel movements per day. Occasionally none. Complains of increased intestinal gas, fatigue, and decreased appetite. No fevers. She feels that she's tolerating her medications well. She continues to smoke. No new complaints  REVIEW OF SYSTEMS:  All non-GI ROS negative except for flashes  Past Medical History:  Diagnosis Date  . Crohn disease ([place])   . Depression     Past Surgical History:  Procedure Laterality Date  . ABDOMINAL SURGERY      Social History BINNIE DROESSLER  reports that she has been smoking Cigarettes.  She has never used smokeless tobacco. She reports that she drinks alcohol. She reports that she does not use drugs.  family history includes Crohn's disease in her father, mother, and sister; Diabetes in her paternal grandmother and son; Ovarian cancer in her maternal grandmother.  Allergies  Allergen Reactions  . Codeine Itching       PHYSICAL EXAMINATION: Vital signs: BP 104/60   Pulse 80   Ht 5' 6"  (1.676 m)   Wt 122 lb 4 oz (55.5 kg)   BMI 19.73 kg/m   Constitutional: generally well-appearing, no acute  distress Psychiatric: alert and oriented x3, cooperative Eyes: extraocular movements intact, anicteric, conjunctiva pink Mouth: oral pharynx moist, no lesions Neck: supple no lymphadenopathy Cardiovascular: heart regular rate and rhythm, no murmur Lungs: clear to auscultation bilaterally Abdomen: soft, Mild right lower quadrant tenderness without rebound, nondistended, no obvious ascites, no peritoneal signs, normal bowel sounds, no organomegaly. Multiple surgical incisions well-healed Rectal:Omitted Extremities: no clubbing cyanosis or lower extremity edema bilaterally Skin: no lesions on visible extremities Neuro: No focal deficits. No asterixis.    ASSESSMENT:  #1. Crohn's ileocolitis status post ileocecectomy 2006 with reoperation that year for small bowel obstruction. Recent evaluation with significant active ileal disease for which she has now on 6-mercaptopurine and Humira. Seems to be stable to improved. Off steroids   PLAN:  #1. CBC and comprehensive metabolic panel today #2. Continue 6-mercaptopurine 50 mg daily #3. continue Humira maintenance therapy #4. Initiate Colestid 1 g twice daily. Do not take within 2 hours of other medications. #5. Stop smoking #6. Routine office follow-up 3 months. Contact the office in the interim for any questions or problems whatsoever

## 2016-12-20 ENCOUNTER — Other Ambulatory Visit: Payer: Self-pay

## 2016-12-20 DIAGNOSIS — K50919 Crohn's disease, unspecified, with unspecified complications: Secondary | ICD-10-CM

## 2017-03-19 ENCOUNTER — Encounter: Payer: Self-pay | Admitting: Internal Medicine

## 2017-03-19 ENCOUNTER — Ambulatory Visit (INDEPENDENT_AMBULATORY_CARE_PROVIDER_SITE_OTHER): Payer: 59 | Admitting: Internal Medicine

## 2017-03-19 ENCOUNTER — Other Ambulatory Visit (INDEPENDENT_AMBULATORY_CARE_PROVIDER_SITE_OTHER): Payer: 59

## 2017-03-19 ENCOUNTER — Telehealth: Payer: Self-pay

## 2017-03-19 VITALS — BP 110/60 | HR 80 | Ht 66.25 in | Wt 116.6 lb

## 2017-03-19 DIAGNOSIS — R197 Diarrhea, unspecified: Secondary | ICD-10-CM | POA: Diagnosis not present

## 2017-03-19 DIAGNOSIS — R1031 Right lower quadrant pain: Secondary | ICD-10-CM

## 2017-03-19 DIAGNOSIS — K5010 Crohn's disease of large intestine without complications: Secondary | ICD-10-CM

## 2017-03-19 DIAGNOSIS — K50119 Crohn's disease of large intestine with unspecified complications: Secondary | ICD-10-CM | POA: Diagnosis not present

## 2017-03-19 DIAGNOSIS — K5000 Crohn's disease of small intestine without complications: Secondary | ICD-10-CM

## 2017-03-19 DIAGNOSIS — K5090 Crohn's disease, unspecified, without complications: Secondary | ICD-10-CM | POA: Diagnosis not present

## 2017-03-19 LAB — CBC WITH DIFFERENTIAL/PLATELET
Basophils Absolute: 0.1 10*3/uL (ref 0.0–0.1)
Basophils Relative: 0.6 % (ref 0.0–3.0)
EOS PCT: 0.8 % (ref 0.0–5.0)
Eosinophils Absolute: 0.1 10*3/uL (ref 0.0–0.7)
HCT: 38.4 % (ref 36.0–46.0)
HEMOGLOBIN: 13.1 g/dL (ref 12.0–15.0)
LYMPHS ABS: 3 10*3/uL (ref 0.7–4.0)
Lymphocytes Relative: 30.3 % (ref 12.0–46.0)
MCHC: 34.1 g/dL (ref 30.0–36.0)
MCV: 102.1 fl — AB (ref 78.0–100.0)
Monocytes Absolute: 0.4 10*3/uL (ref 0.1–1.0)
Monocytes Relative: 4 % (ref 3.0–12.0)
Neutro Abs: 6.3 10*3/uL (ref 1.4–7.7)
Neutrophils Relative %: 64.3 % (ref 43.0–77.0)
Platelets: 212 10*3/uL (ref 150.0–400.0)
RBC: 3.76 Mil/uL — AB (ref 3.87–5.11)
RDW: 14.7 % (ref 11.5–15.5)
WBC: 9.8 10*3/uL (ref 4.0–10.5)

## 2017-03-19 LAB — COMPREHENSIVE METABOLIC PANEL
ALK PHOS: 68 U/L (ref 39–117)
ALT: 9 U/L (ref 0–35)
AST: 13 U/L (ref 0–37)
Albumin: 4.3 g/dL (ref 3.5–5.2)
BUN: 6 mg/dL (ref 6–23)
CHLORIDE: 102 meq/L (ref 96–112)
CO2: 33 mEq/L — ABNORMAL HIGH (ref 19–32)
Calcium: 10.2 mg/dL (ref 8.4–10.5)
Creatinine, Ser: 0.76 mg/dL (ref 0.40–1.20)
GFR: 84.92 mL/min (ref >60.00)
GLUCOSE: 182 mg/dL — AB (ref 70–99)
Potassium: 3.7 mEq/L (ref 3.5–5.1)
Sodium: 140 mEq/L (ref 135–145)
TOTAL PROTEIN: 7.1 g/dL (ref 6.0–8.3)
Total Bilirubin: 0.6 mg/dL (ref 0.2–1.2)

## 2017-03-19 LAB — SEDIMENTATION RATE: Sed Rate: 17 mm/hr (ref 0–30)

## 2017-03-19 NOTE — Patient Instructions (Addendum)
Your physician has requested that you go to the basement for the following lab work before leaving today: CMET, CBC, ESR  You have been scheduled for a CT scan of the abdomen and pelvis at [facility] ([address]---this is in the same building as [facility]).   You are scheduled on 03/21/2017 at 11:00am. You should arrive 15 minutes prior to your appointment time for registration. Please follow the written instructions below on the day of your exam:  WARNING: IF YOU ARE ALLERGIC TO IODINE/X-RAY DYE, PLEASE NOTIFY RADIOLOGY IMMEDIATELY AT [phone]! YOU WILL BE GIVEN A 13 HOUR PREMEDICATION PREP.  1) Do not eat anything after 7:00am (4 hours prior to your test) 2) You have been given 2 bottles of oral contrast to drink. The solution may taste better if refrigerated, but do NOT add ice or any other liquid to this solution. Shake well before drinking.    Drink 1 bottle of contrast @ 9:00am (2 hours prior to your exam)  Drink 1 bottle of contrast @ 10:00am (1 hour prior to your exam)  You may take any medications as prescribed with a small amount of water except for the following: Metformin, Glucophage, Glucovance, Avandamet, Riomet, Fortamet, Actoplus Met, Janumet, Glumetza or Metaglip. The above medications must be held the day of the exam AND 48 hours after the exam.  The purpose of you drinking the oral contrast is to aid in the visualization of your intestinal tract. The contrast solution may cause some diarrhea. Before your exam is started, you will be given a small amount of fluid to drink. Depending on your individual set of symptoms, you may also receive an intravenous injection of x-ray contrast/dye. Plan on being at [facility] for 30 minutes or long, depending on the type of exam you are having performed.  If you have any questions regarding your exam or if you need to reschedule, you may call the CT department at [phone] between the hours of 8:00 am  and 5:00 pm, Monday-Friday.  ________________________________________________________________________  Please follow up in 3 months

## 2017-03-19 NOTE — Telephone Encounter (Signed)
Patient scheduled for 2:15 but will be checked in as soon as possible.

## 2017-03-19 NOTE — Telephone Encounter (Signed)
Patient returned your phone call stating that she will be here today at 2pm.

## 2017-03-19 NOTE — Progress Notes (Signed)
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 Also history of tubular adenoma. Last fall found to have significant active ileal Crohn's disease clinically and endoscopically for which she had been on steroids. Subsequently transitioned to 6-mercaptopurine and Humira therapy late December 2017. Last office evaluation February 2018. At that time she seemed to be stable to improved and off steroids. Blood work at that time was stable. She was continued on her medications. Prescribed Colestid for her diarrhea. Told to stop smoking. Follow up at this time recommended. Patient tells me that Colestid pills were too large to consume. She still has some loose stools. She continues to smoke. She tells me that she is doing okay though she continues to lose weight. She tends to underestimate symptoms. Will admit to some right sided abdominal pain which is exacerbated by meals. No vomiting. No fevers. No rash. No cough.  REVIEW OF SYSTEMS:  All non-GI ROS negative except for fatigue, headaches, hot flashes  Past Medical History:  Diagnosis Date  . Crohn disease ([place])   . Depression     Past Surgical History:  Procedure Laterality Date  . ABDOMINAL SURGERY      Social History Melissa Chaney  reports that she has been smoking Cigarettes.  She has never used smokeless tobacco. She reports that she drinks alcohol. She reports that she does not use drugs.  family history includes Crohn's disease in her father, mother, and sister; Diabetes in her paternal grandmother and son; Ovarian cancer in her maternal grandmother.  Allergies  Allergen Reactions  . Codeine Itching       PHYSICAL EXAMINATION: Vital signs: BP 110/60   Pulse 80   Ht 5' 6.25" (1.683 m) Comment: measured with shoes off  Wt 116 lb 9.6 oz (52.9 kg)    BMI 18.68 kg/m  (5 pound weight loss) Constitutional:Thin, generally well-appearing, no acute distress Psychiatric: alert and oriented x3, cooperative Eyes: extraocular movements intact, anicteric, conjunctiva pink Mouth: oral pharynx moist, no lesions Neck: supple no lymphadenopathy Cardiovascular: heart regular rate and rhythm, no murmur Lungs: clear to auscultation bilaterally Abdomen: soft, Moderately severe tenderness and fullness in the right lower quadrant, nondistended, no obvious ascites, no peritoneal signs, normal bowel sounds, no organomegaly. Surgical incisions well-healed Rectal:Omitted Extremities: no clubbing cyanosis or lower extremity edema bilaterally Skin: no lesions on visible extremities Neuro: No focal deficits. Cranial nerves intact  ASSESSMENT:  #1. Ileal Crohn's disease. Still with significant pain despite Humira and 6-mercaptopurine over the past 5 months. Last colonoscopy October 2017. Last CT enterography October 2017 #2. Bile salt related diarrhea #3. Chronic tobacco abuse #4. Anorexia (food avoidance) and weight loss secondary to pain  PLAN:  #1. CBC, comprehensive metabolic panel, erythrocyte sedimentation rate today #2. Schedule CT scan of the abdomen and pelvis (CT enterography) to evaluate ongoing pain, fullness, and Crohn's #3. Continue Humira and 6-mercaptopurine #4. Stop smoking #5. Could consider Questran powder for diarrhea if it worsens #6. B12 replacement #7. Routine office follow-up no later than 3 months

## 2017-03-21 ENCOUNTER — Ambulatory Visit (INDEPENDENT_AMBULATORY_CARE_PROVIDER_SITE_OTHER)
Admission: RE | Admit: 2017-03-21 | Discharge: 2017-03-21 | Disposition: A | Discharge status: Home / Self Care | Payer: 59 | Source: Ambulatory Visit | Attending: Internal Medicine | Admitting: Internal Medicine

## 2017-03-21 DIAGNOSIS — K5010 Crohn's disease of large intestine without complications: Secondary | ICD-10-CM | POA: Diagnosis not present

## 2017-03-21 MED ORDER — IOPAMIDOL (ISOVUE-300) INJECTION 61%
100.0000 mL | Freq: Once | INTRAVENOUS | Status: AC | PRN
  Administered 2017-03-21: 100 mL via INTRAVENOUS

## 2017-06-04 ENCOUNTER — Other Ambulatory Visit: Payer: Self-pay | Admitting: Internal Medicine

## 2017-11-09 ENCOUNTER — Other Ambulatory Visit (INDEPENDENT_AMBULATORY_CARE_PROVIDER_SITE_OTHER): Payer: 59

## 2017-11-09 ENCOUNTER — Other Ambulatory Visit: Payer: Self-pay

## 2017-11-09 ENCOUNTER — Ambulatory Visit: Payer: 59 | Admitting: Internal Medicine

## 2017-11-09 ENCOUNTER — Encounter: Payer: Self-pay | Admitting: Internal Medicine

## 2017-11-09 VITALS — BP 90/66 | HR 84 | Ht 66.25 in | Wt 117.1 lb

## 2017-11-09 DIAGNOSIS — K50919 Crohn's disease, unspecified, with unspecified complications: Secondary | ICD-10-CM

## 2017-11-09 DIAGNOSIS — K50019 Crohn's disease of small intestine with unspecified complications: Secondary | ICD-10-CM

## 2017-11-09 DIAGNOSIS — Z720 Tobacco use: Secondary | ICD-10-CM | POA: Diagnosis not present

## 2017-11-09 DIAGNOSIS — K5090 Crohn's disease, unspecified, without complications: Secondary | ICD-10-CM

## 2017-11-09 LAB — CBC WITH DIFFERENTIAL/PLATELET
BASOS ABS: 0.1 10*3/uL (ref 0.0–0.1)
Basophils Relative: 0.5 % (ref 0.0–3.0)
EOS ABS: 0.1 10*3/uL (ref 0.0–0.7)
Eosinophils Relative: 0.6 % (ref 0.0–5.0)
HCT: 40 % (ref 36.0–46.0)
Hemoglobin: 13.3 g/dL (ref 12.0–15.0)
LYMPHS ABS: 2.2 10*3/uL (ref 0.7–4.0)
Lymphocytes Relative: 15 % (ref 12.0–46.0)
MCHC: 33.2 g/dL (ref 30.0–36.0)
MCV: 104.5 fl — ABNORMAL HIGH (ref 78.0–100.0)
MONO ABS: 0.8 10*3/uL (ref 0.1–1.0)
MONOS PCT: 5.6 % (ref 3.0–12.0)
NEUTROS ABS: 11.4 10*3/uL — AB (ref 1.4–7.7)
NEUTROS PCT: 78.3 % — AB (ref 43.0–77.0)
PLATELETS: 223 10*3/uL (ref 150.0–400.0)
RBC: 3.83 Mil/uL — ABNORMAL LOW (ref 3.87–5.11)
RDW: 15.1 % (ref 11.5–15.5)
WBC: 14.5 10*3/uL — ABNORMAL HIGH (ref 4.0–10.5)

## 2017-11-09 LAB — COMPREHENSIVE METABOLIC PANEL
ALBUMIN: 4.3 g/dL (ref 3.5–5.2)
ALK PHOS: 77 U/L (ref 39–117)
ALT: 8 U/L (ref 0–35)
AST: 13 U/L (ref 0–37)
BILIRUBIN TOTAL: 0.6 mg/dL (ref 0.2–1.2)
BUN: 9 mg/dL (ref 6–23)
CO2: 32 mEq/L (ref 19–32)
CREATININE: 0.77 mg/dL (ref 0.40–1.20)
Calcium: 9.9 mg/dL (ref 8.4–10.5)
Chloride: 102 mEq/L (ref 96–112)
GFR: 83.44 mL/min (ref >60.00)
Glucose, Bld: 89 mg/dL (ref 70–99)
POTASSIUM: 3.6 meq/L (ref 3.5–5.1)
SODIUM: 142 meq/L (ref 135–145)
TOTAL PROTEIN: 6.9 g/dL (ref 6.0–8.3)

## 2017-11-09 LAB — HIGH SENSITIVITY CRP: CRP HIGH SENSITIVITY: 2.57 mg/L (ref 0.000–5.000)

## 2017-11-09 MED ORDER — MERCAPTOPURINE 50 MG PO TABS: ORAL_TABLET | ORAL | 6 refills | Status: DC

## 2017-11-09 NOTE — Progress Notes (Signed)
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 Also history of tubular adenoma. Fall 2017 was found to have significant active ileal Crohn's disease clinically and endoscopically for which she has been on steroids. Subsequently transitioned to 6 mercaptopurine and Humira therapy late 2017. Last evaluated in this office 03/19/2017. At that time she was still experiencing symptoms, though not as severe. Other issues have included bile salt related diarrhea, chronic tobacco abuse, and B12 replacement therapy. After her last office visit she did undergo CT enterography which showed ongoing but improved inflammation of the luminal gut. Said to have some inflammation in the sigmoid region though colonoscopy was negative. She was to continue on her medications and follow up in 3 months. She follows up at this time. She has been compliant with medical therapy though she is 2 days overdue for her Humira injection due to some issues with insurance company (which have asked my CMA to look into today in order to avoid interruption and therapy). She is pleased report that she is no longer having abdominal pain. Her diarrhea has improved and she currently reports one to 2 semi-formed stools per day. No fevers or bleeding. Her weight has been stable since her last visit. No nausea or vomiting. No extraintestinal complaints such as rash, visual issues, or joint aches. Unfortunately, she continues to smoke.  REVIEW OF SYSTEMS:  All non-GI ROS negative except for depression  Past Medical History:  Diagnosis Date  . Crohn disease ([place])   . Depression     Past Surgical History:  Procedure Laterality Date  . ABDOMINAL SURGERY      Social History Melissa Chaney  reports that she has been smoking cigarettes.  she has never used  smokeless tobacco. She reports that she drinks alcohol. She reports that she does not use drugs.  family history includes Crohn's disease in her father, mother, and sister; Diabetes in her paternal grandmother and son; Ovarian cancer in her maternal grandmother.  Allergies  Allergen Reactions  . Codeine Itching       PHYSICAL EXAMINATION: Vital signs: BP 90/66   Pulse 84   Ht 5' 6.25" (1.683 m)   Wt 117 lb 2 oz (53.1 kg)   BMI 18.76 kg/m   Constitutional: Thin but generally well-appearing, no acute distress Psychiatric: alert and oriented x3, cooperative Eyes: extraocular movements intact, anicteric, conjunctiva pink Mouth: oral pharynx moist, no lesions Neck: supple no lymphadenopathy Cardiovascular: heart regular rate and rhythm, no murmur Lungs: clear to auscultation bilaterally Abdomen: soft, nontender, nondistended, no obvious ascites, no peritoneal signs, normal bowel sounds, no organomegaly Rectal: Omitted. Previous surgical incision well-healed Extremities: no clubbing, cyanosis, or lower extremity edema bilaterally Skin: no lesions on visible extremities Neuro: No focal deficits. Cranial nerves intact  ASSESSMENT:  #1. Ileal Crohn's status post remote ileocecectomy. Problems late last year with significant clinical, radiographic, and endoscopic ileal Crohn's disease. Now doing well on combination therapy with 6 mercaptopurine 50 mg daily and Humira 40 mg every 2 weeks #2. Diarrhea. Currently not problematic. Not requiring Questran #3. B12 replacement #4. Ongoing tobacco abuse #5. Status post Twinrix vaccination series #6. History of adenomatous colon polyp. Last colonoscopy October 2017  PLAN:  #1. Continue Humira every 2 weeks #2. Continue 6-mercaptopurine 50 mg daily #3. CBC, comprehensive metabolic panel, CRP today #4. CBC every  3 months. Compliance stressed #5. Routine GI follow-up 6 months. Sooner if needed. Will need repeat TB testing at that time #6.  Stop smoking !  [facility] return today. No significant abnormalities. Mildly elevated white blood cell count acceptable after clinical evaluation. Repeat CBC in 3 months  25 minutes spent face-to-face with the patient. Greater than 50% a time use for counseling regarding management and monitoring of her complicated Crohn's disease

## 2017-11-09 NOTE — Patient Instructions (Addendum)
We have sent the following medications to your pharmacy for you to pick up at your convenience:  6 mp and Humira  Your physician has requested that you go to the basement for the following lab work before leaving today:  CBC, CRP, CMET.  Please follow up in 6 months

## 2018-03-04 ENCOUNTER — Other Ambulatory Visit (INDEPENDENT_AMBULATORY_CARE_PROVIDER_SITE_OTHER): Payer: 59

## 2018-03-04 ENCOUNTER — Other Ambulatory Visit: Payer: Self-pay

## 2018-03-04 DIAGNOSIS — K5090 Crohn's disease, unspecified, without complications: Secondary | ICD-10-CM

## 2018-03-04 LAB — CBC WITH DIFFERENTIAL/PLATELET
Basophils Absolute: 0 10*3/uL (ref 0.0–0.1)
Basophils Relative: 0.4 % (ref 0.0–3.0)
EOS ABS: 0.1 10*3/uL (ref 0.0–0.7)
Eosinophils Relative: 1.2 % (ref 0.0–5.0)
HCT: 40 % (ref 36.0–46.0)
HEMOGLOBIN: 13.6 g/dL (ref 12.0–15.0)
Lymphocytes Relative: 19.3 % (ref 12.0–46.0)
Lymphs Abs: 1.9 10*3/uL (ref 0.7–4.0)
MCHC: 34 g/dL (ref 30.0–36.0)
MCV: 103.8 fl — ABNORMAL HIGH (ref 78.0–100.0)
MONO ABS: 0.6 10*3/uL (ref 0.1–1.0)
Monocytes Relative: 6.1 % (ref 3.0–12.0)
Neutro Abs: 7.2 10*3/uL (ref 1.4–7.7)
Neutrophils Relative %: 73 % (ref 43.0–77.0)
Platelets: 190 10*3/uL (ref 150.0–400.0)
RBC: 3.86 Mil/uL — AB (ref 3.87–5.11)
RDW: 14.2 % (ref 11.5–15.5)
WBC: 9.8 10*3/uL (ref 4.0–10.5)

## 2018-07-10 ENCOUNTER — Other Ambulatory Visit: Payer: Self-pay

## 2018-07-10 ENCOUNTER — Other Ambulatory Visit (INDEPENDENT_AMBULATORY_CARE_PROVIDER_SITE_OTHER): Payer: 59

## 2018-07-10 DIAGNOSIS — K5090 Crohn's disease, unspecified, without complications: Secondary | ICD-10-CM

## 2018-07-10 LAB — CBC WITH DIFFERENTIAL/PLATELET
BASOS ABS: 0.1 10*3/uL (ref 0.0–0.1)
Basophils Relative: 1.2 % (ref 0.0–3.0)
Eosinophils Absolute: 0.1 10*3/uL (ref 0.0–0.7)
Eosinophils Relative: 0.9 % (ref 0.0–5.0)
HEMATOCRIT: 40.5 % (ref 36.0–46.0)
HEMOGLOBIN: 13.7 g/dL (ref 12.0–15.0)
LYMPHS PCT: 25.7 % (ref 12.0–46.0)
Lymphs Abs: 2.6 10*3/uL (ref 0.7–4.0)
MCHC: 33.9 g/dL (ref 30.0–36.0)
MCV: 102.6 fl — AB (ref 78.0–100.0)
Monocytes Absolute: 0.4 10*3/uL (ref 0.1–1.0)
Monocytes Relative: 4.2 % (ref 3.0–12.0)
Neutro Abs: 6.8 10*3/uL (ref 1.4–7.7)
Neutrophils Relative %: 68 % (ref 43.0–77.0)
PLATELETS: 187 10*3/uL (ref 150.0–400.0)
RBC: 3.95 Mil/uL (ref 3.87–5.11)
RDW: 14.7 % (ref 11.5–15.5)
WBC: 10.1 10*3/uL (ref 4.0–10.5)

## 2018-07-19 ENCOUNTER — Ambulatory Visit: Payer: 59 | Admitting: Internal Medicine

## 2018-07-19 ENCOUNTER — Encounter: Payer: Self-pay | Admitting: Internal Medicine

## 2018-07-19 VITALS — BP 110/70 | HR 76 | Ht 65.75 in | Wt 124.2 lb

## 2018-07-19 DIAGNOSIS — E538 Deficiency of other specified B group vitamins: Secondary | ICD-10-CM

## 2018-07-19 DIAGNOSIS — K50119 Crohn's disease of large intestine with unspecified complications: Secondary | ICD-10-CM

## 2018-07-19 DIAGNOSIS — Z720 Tobacco use: Secondary | ICD-10-CM

## 2018-07-19 MED ORDER — ADALIMUMAB 40 MG/0.4ML ~~LOC~~ AJKT: 1.0000 | AUTO-INJECTOR | SUBCUTANEOUS | 2 refills | Status: DC

## 2018-07-19 MED ORDER — MERCAPTOPURINE 50 MG PO TABS: ORAL_TABLET | ORAL | 6 refills | Status: DC

## 2018-07-19 MED ORDER — CYANOCOBALAMIN 1000 MCG/ML IJ SOLN: 1000.0000 ug | INTRAMUSCULAR | 2 refills | Status: DC

## 2018-07-19 NOTE — Progress Notes (Signed)
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 History of tubular adenoma. Again active ileal Crohn's disease fall 2017. Started on 6 mercaptopurine and Humira late 2017. Last evaluated in this office 11/09/2017. At that time she was doing well on combination therapy. Oh issues with diarrhea. She has continued to be compliant with Humira and 6 mercaptopurine. She has been compliant with regular CBCs which have been excellent. Last blood count 07/10/2018 was normal with MCV 102.6. She tells me that she has been having no significant problems with pain. Eating well. Has gained weight. No issues with diarrhea. Is not getting regular B12 replacement. Does not have a PCP. No GYN. She continues to smoke  REVIEW OF SYSTEMS:  All non-GI ROS negative except for night sweats and fatigue  Past Medical History:  Diagnosis Date  . Crohn disease ([place])   . Depression     Past Surgical History:  Procedure Laterality Date  . ABDOMINAL SURGERY      Social History [name]  reports that she has been smoking cigarettes. She has never used smokeless tobacco. She reports that she drinks alcohol. She reports that she does not use drugs.  family history includes Crohn's disease in her father, mother, and sister; Diabetes in her paternal grandmother and son; Ovarian cancer in her maternal grandmother.  Allergies  Allergen Reactions  . Codeine Itching       PHYSICAL EXAMINATION: Vital signs: BP 110/70 (BP Location: Left Arm, Patient Position: Sitting, Cuff Size: Normal)   Pulse 76 Comment: irregular  Ht 5' 5.75" (1.67 m) Comment: height measured without shoes  Wt 124 lb 4 oz (56.4 kg)   BMI 20.21 kg/m  . Up 7 pounds from last visit Constitutional: generally well-appearing, no acute distress Psychiatric: alert and  oriented x3, cooperative Eyes: extraocular movements intact, anicteric, conjunctiva pink Mouth: oral pharynx moist, no lesions Neck: supple no lymphadenopathy Cardiovascular: heart regular rate and rhythm, no murmur Lungs: clear to auscultation bilaterally Abdomen: soft, nontender, nondistended, no obvious ascites, no peritoneal signs, normal bowel sounds, no organomegaly. Surgical incision well-healed Rectal:omitted Extremities: no clubbing, cyanosis, or lower extremity edema bilaterally Skin: no lesions on visible extremities. Lower extremity tattoo Neuro: No focal deficits. Cranial nerves intact  ASSESSMENT:  #1. Ileal Crohn status post ileocecectomy 2006 #2. Small bowel obstruction status post laparotomy November 2006 #3. On combination therapy with Humira and 6 mercaptopurine for significant active ileal disease since late 2017. Clinically doing well at this time #4. History of B12 deficiency. Not on replacement #5. History of diarrhea. No issue at this time #6. History of adenomatous polyp. Last colonoscopy 2017   PLAN:  #1. Continue Humira and 6-mercaptopurine. Medications refilled #2. I want her to have B12 replacement therapy here. 1000 g every 3 months #3. She has been advised to obtain PCP and GYN for routine health maintenance #4. Again advised to stop smoking #5. Routine GI follow-up 6 months. Contact the office in the interim for questions or problems #6. Routine colonoscopy around 2022  25 minutes spent face-to-face with the patient. Greater than 50% a time use for counseling regarding her, complicated Crohn's disease, its management, and recommendations for primary care providers and stop smoking

## 2018-07-19 NOTE — Patient Instructions (Signed)
We have sent the following medications to your pharmacy for you to pick up at your convenience:  B 12, 6 mp, humira  We will begin quarterly B 12 injections on ________________  Obtain a gynecologist as discussed with [name].  Please follow up in 6 months

## 2018-07-22 ENCOUNTER — Telehealth: Payer: Self-pay | Admitting: Internal Medicine

## 2018-07-22 NOTE — Telephone Encounter (Signed)
Patient states she can not come in this Wednesday 9.18.19 for her B12 but wants to know since she has to pick it up at [facility] if they can do it there? Please advise pt.

## 2018-07-23 NOTE — Telephone Encounter (Signed)
Spoke with patient and rescheduled her B 12 injection for next week.

## 2018-07-24 ENCOUNTER — Other Ambulatory Visit: Payer: Self-pay

## 2018-07-24 MED ORDER — ADALIMUMAB 40 MG/0.4ML ~~LOC~~ AJKT: 40.0000 mg | AUTO-INJECTOR | SUBCUTANEOUS | 6 refills | Status: DC

## 2018-07-30 ENCOUNTER — Telehealth: Payer: Self-pay | Admitting: Internal Medicine

## 2018-07-30 ENCOUNTER — Ambulatory Visit (INDEPENDENT_AMBULATORY_CARE_PROVIDER_SITE_OTHER): Payer: 59 | Admitting: Internal Medicine

## 2018-07-30 DIAGNOSIS — E538 Deficiency of other specified B group vitamins: Secondary | ICD-10-CM | POA: Diagnosis not present

## 2018-07-30 NOTE — Telephone Encounter (Signed)
Called [facility] and PA is under clinical review Ref # [id number]

## 2018-07-30 NOTE — Telephone Encounter (Signed)
[facility] calling regarding PA for Humira pen, prior Josem Kaufmann is not longer valid so they need a new one. They could take a verbal auth as pt runs out of med tomorrow. If you fax PA form pls indicate that it is urgent.

## 2018-07-31 ENCOUNTER — Telehealth: Payer: Self-pay | Admitting: Internal Medicine

## 2018-07-31 MED ORDER — CYANOCOBALAMIN 1000 MCG/ML IJ SOLN
1000.0000 ug | Freq: Once | INTRAMUSCULAR | Status: AC
  Administered 2018-07-31: 1000 ug via INTRAMUSCULAR

## 2018-07-31 MED ORDER — CYANOCOBALAMIN 1000 MCG/ML IJ SOLN: 1000.0000 ug | Freq: Once | INTRAMUSCULAR | 0 refills | Status: AC

## 2018-07-31 NOTE — Telephone Encounter (Signed)
Spoke with pharmacy and let them know no starter kit needed.

## 2018-07-31 NOTE — Telephone Encounter (Signed)
Did the pt call? I called the pharmacy number in the message and they state she is not one of their patients.

## 2018-07-31 NOTE — Telephone Encounter (Signed)
It was the pharmacy that called

## 2018-07-31 NOTE — Telephone Encounter (Signed)
Per number in message for pharmacy this pt is not theirs.

## 2018-07-31 NOTE — Telephone Encounter (Signed)
Kim from [facility] rx calling to verify if the patient needs the starter pack or not. Pharmacy requesting call back to verify at [phone].

## 2018-10-03 ENCOUNTER — Other Ambulatory Visit: Payer: Self-pay | Admitting: Internal Medicine

## 2018-10-09 ENCOUNTER — Telehealth: Payer: Self-pay

## 2018-10-09 NOTE — Telephone Encounter (Signed)
Patient scheduled for B12 on 12.11.19.

## 2018-10-09 NOTE — Telephone Encounter (Signed)
-----   Message from [place] sent at 07/31/2018  9:03 AM EDT ----- Patient due for 3 month b 12 in December

## 2018-10-09 NOTE — Telephone Encounter (Signed)
Lm for patient to call me back to schedule a B12 injection

## 2018-10-14 ENCOUNTER — Other Ambulatory Visit: Payer: Self-pay | Admitting: Internal Medicine

## 2018-10-16 ENCOUNTER — Ambulatory Visit (INDEPENDENT_AMBULATORY_CARE_PROVIDER_SITE_OTHER): Payer: 59 | Admitting: Internal Medicine

## 2018-10-16 ENCOUNTER — Other Ambulatory Visit (INDEPENDENT_AMBULATORY_CARE_PROVIDER_SITE_OTHER): Payer: 59

## 2018-10-16 ENCOUNTER — Other Ambulatory Visit: Payer: Self-pay

## 2018-10-16 DIAGNOSIS — K5090 Crohn's disease, unspecified, without complications: Secondary | ICD-10-CM | POA: Diagnosis not present

## 2018-10-16 DIAGNOSIS — E538 Deficiency of other specified B group vitamins: Secondary | ICD-10-CM | POA: Diagnosis not present

## 2018-10-16 LAB — CBC WITH DIFFERENTIAL/PLATELET
BASOS ABS: 0.1 10*3/uL (ref 0.0–0.1)
Basophils Relative: 0.7 % (ref 0.0–3.0)
EOS ABS: 0 10*3/uL (ref 0.0–0.7)
EOS PCT: 0.1 % (ref 0.0–5.0)
HCT: 39.4 % (ref 36.0–46.0)
Hemoglobin: 13.3 g/dL (ref 12.0–15.0)
Lymphocytes Relative: 12.4 % (ref 12.0–46.0)
Lymphs Abs: 1.7 10*3/uL (ref 0.7–4.0)
MCHC: 33.7 g/dL (ref 30.0–36.0)
MCV: 101.9 fl — AB (ref 78.0–100.0)
MONO ABS: 0.6 10*3/uL (ref 0.1–1.0)
MONOS PCT: 4.7 % (ref 3.0–12.0)
NEUTROS ABS: 11.2 10*3/uL — AB (ref 1.4–7.7)
NEUTROS PCT: 82.1 % — AB (ref 43.0–77.0)
Platelets: 240 10*3/uL (ref 150.0–400.0)
RBC: 3.87 Mil/uL (ref 3.87–5.11)
RDW: 15.6 % — AB (ref 11.5–15.5)
WBC: 13.6 10*3/uL — AB (ref 4.0–10.5)

## 2018-10-16 MED ORDER — CYANOCOBALAMIN 1000 MCG/ML IJ SOLN
1000.0000 ug | Freq: Once | INTRAMUSCULAR | Status: AC
  Administered 2018-10-16: 1000 ug via INTRAMUSCULAR

## 2019-02-12 ENCOUNTER — Other Ambulatory Visit: Payer: Self-pay | Admitting: Internal Medicine

## 2019-02-12 NOTE — Telephone Encounter (Signed)
Last seen in clinic 07-2018. Last TB gold done 10-2016. Can we refill?

## 2019-02-12 NOTE — Telephone Encounter (Signed)
Ok to refill. Needs TB test and ov this year. Thanks

## 2019-02-13 ENCOUNTER — Other Ambulatory Visit: Payer: Self-pay

## 2019-02-13 DIAGNOSIS — K5090 Crohn's disease, unspecified, without complications: Secondary | ICD-10-CM

## 2019-02-13 NOTE — Telephone Encounter (Signed)
Left a detailed message to call and schedule a follow up and have lab completed. Order is in [place].

## 2019-02-27 ENCOUNTER — Ambulatory Visit: Payer: 59 | Admitting: Internal Medicine

## 2019-08-06 ENCOUNTER — Other Ambulatory Visit: Payer: Self-pay

## 2019-08-15 ENCOUNTER — Other Ambulatory Visit: Payer: Self-pay | Admitting: Internal Medicine

## 2019-11-28 ENCOUNTER — Other Ambulatory Visit: Payer: Self-pay | Admitting: Internal Medicine

## 2020-07-04 ENCOUNTER — Encounter: Payer: Self-pay | Admitting: Emergency Medicine

## 2020-07-04 ENCOUNTER — Ambulatory Visit
Admission: EM | Admit: 2020-07-04 | Discharge: 2020-07-04 | Disposition: A | Discharge status: Home / Self Care | Payer: 59 | Attending: Emergency Medicine | Admitting: Emergency Medicine

## 2020-07-04 ENCOUNTER — Other Ambulatory Visit: Payer: Self-pay

## 2020-07-04 DIAGNOSIS — R6883 Chills (without fever): Secondary | ICD-10-CM

## 2020-07-04 DIAGNOSIS — Z1152 Encounter for screening for COVID-19: Secondary | ICD-10-CM

## 2020-07-04 DIAGNOSIS — R509 Fever, unspecified: Secondary | ICD-10-CM

## 2020-07-04 MED ORDER — PREDNISONE 10 MG PO TABS: 20.0000 mg | ORAL_TABLET | Freq: Every day | ORAL | 0 refills | Status: DC

## 2020-07-04 MED ORDER — FLUTICASONE PROPIONATE 50 MCG/ACT NA SUSP: 1.0000 | Freq: Every day | NASAL | 0 refills | Status: DC

## 2020-07-04 MED ORDER — CETIRIZINE HCL 10 MG PO TABS: 10.0000 mg | ORAL_TABLET | Freq: Every day | ORAL | 0 refills | Status: DC

## 2020-07-04 MED ORDER — BENZONATATE 100 MG PO CAPS: 100.0000 mg | ORAL_CAPSULE | Freq: Three times a day (TID) | ORAL | 0 refills | Status: DC

## 2020-07-04 NOTE — ED Provider Notes (Signed)
[facility]   [id number] 07/04/20 Arrival Time: 1537   CC: COVID symptoms  SUBJECTIVE: History from: patient.  Melissa Chaney is a 54 y.o. female who presents to the pharmacy with a complaint of chill, fever, fatigue, decreased appetite since Thursday.  Denies sick exposure to COVID, flu or strep.  Denies recent travel.  Has tried OTC medication without relief.  Denies aggravating factors.  Denies previous symptoms in the past.   Denies , sinus pain, rhinorrhea, sore throat, SOB, wheezing, chest pain, nausea, changes in bowel or bladder habits.    ROS: As per HPI.  All other pertinent ROS negative.     Past Medical History:  Diagnosis Date   Crohn disease ([place])    Depression    Past Surgical History:  Procedure Laterality Date   ABDOMINAL SURGERY     Allergies  Allergen Reactions   Codeine Itching   No current facility-administered medications on file prior to encounter.   Current Outpatient Medications on File Prior to Encounter  Medication Sig Dispense Refill   Adalimumab (HUMIRA PEN) 40 MG/0.4ML PNKT Inject 40 mg into the skin every 14 (fourteen) days. 2 each 6   cyanocobalamin (,VITAMIN B-12,) 1000 MCG/ML injection Inject 1 mL (1,000 mcg total) into the muscle every 3 (three) months. 1 mL 2   HUMIRA PEN 40 MG/0.8ML PNKT Inject 1 pen (70m) subcutaneously every other week. 2 each 4   mercaptopurine (PURINETHOL) 50 MG tablet TAKE 1 TABLET BY MOUTH EVERY DAY GIVE ON EMPTY STOMACH 1 HOUR BEFORE OR 2 HOURS AFTER MEALS. 30 tablet 6   mercaptopurine (PURINETHOL) 50 MG tablet TAKE 1 TABLET BY MOUTH ONCE DAILY ON EMPTY STOMACH (1 HOUR BEFORE OR 2 HOURS AFTER A MEAL) 90 tablet 2   Social History   Socioeconomic History   Marital status: Married    Spouse name: Not on file   Number of children: 2   Years of education: Not on file   Highest education level: Not on file  Occupational History   Occupation: bSecretary/administrator Tobacco Use   Smoking status:  Current Every Day Smoker    Packs/day: 1.00    Types: Cigarettes   Smokeless tobacco: Never Used  Vaping Use   Vaping Use: Never used  Substance and Sexual Activity   Alcohol use: Yes    Comment: occ   Drug use: No   Sexual activity: Not on file  Other Topics Concern   Not on file  Social History Narrative   Not on file   Social Determinants of Health   Financial Resource Strain:    Difficulty of Paying Living Expenses: Not on file  Food Insecurity:    Worried About RCharity fundraiserin the Last Year: Not on file   RYRC Worldwideof Food in the Last Year: Not on file  Transportation Needs:    Lack of Transportation (Medical): Not on file   Lack of Transportation (Non-Medical): Not on file  Physical Activity:    Days of Exercise per Week: Not on file   Minutes of Exercise per Session: Not on file  Stress:    Feeling of Stress : Not on file  Social Connections:    Frequency of Communication with Friends and Family: Not on file   Frequency of Social Gatherings with Friends and Family: Not on file   Attends Religious Services: Not on file   Active Member of Clubs or Organizations: Not on file   Attends CArchivist  Meetings: Not on file   Marital Status: Not on file  Intimate Partner Violence:    Fear of Current or Ex-Partner: Not on file   Emotionally Abused: Not on file   Physically Abused: Not on file   Sexually Abused: Not on file   Family History  Problem Relation Age of Onset   Crohn's disease Mother    Crohn's disease Father    Crohn's disease Sister    Ovarian cancer Maternal Grandmother    Diabetes Paternal Grandmother    Diabetes Son     OBJECTIVE:  Vitals:   07/04/20 1501  BP: (!) 152/67  Pulse: 81  Resp: 18  Temp: 98.3 F (36.8 C)  TempSrc: Oral  SpO2: 94%  Weight: 115 lb (52.2 kg)  Height: 5' 6"  (1.676 m)     General appearance: alert; appears fatigued, but nontoxic; speaking in full sentences and  tolerating own secretions HEENT: NCAT; Ears: EACs clear, TMs pearly gray; Eyes: PERRL.  EOM grossly intact. Sinuses: nontender; Nose: nares patent without rhinorrhea, Throat: oropharynx clear, tonsils non erythematous or enlarged, uvula midline  Neck: supple without LAD Lungs: unlabored respirations, symmetrical air entry; cough: present; no respiratory distress; CTAB Heart: regular rate and rhythm.  Radial pulses 2+ symmetrical bilaterally Skin: warm and dry Psychological: alert and cooperative; normal mood and affect  LABS:  No results found for this or any previous visit (from the past 24 hour(s)).   ASSESSMENT & PLAN:  1. Chills   2. Fever, unknown origin   3. Encounter for screening for COVID-19     Meds ordered this encounter  Medications   predniSONE (DELTASONE) 10 MG tablet    Sig: Take 2 tablets (20 mg total) by mouth daily.    Dispense:  15 tablet    Refill:  0   fluticasone (FLONASE) 50 MCG/ACT nasal spray    Sig: Place 1 spray into both nostrils daily for 14 days.    Dispense:  16 g    Refill:  0   cetirizine (ZYRTEC ALLERGY) 10 MG tablet    Sig: Take 1 tablet (10 mg total) by mouth daily.    Dispense:  30 tablet    Refill:  0   benzonatate (TESSALON) 100 MG capsule    Sig: Take 1 capsule (100 mg total) by mouth every 8 (eight) hours.    Dispense:  30 capsule    Refill:  0    Discharge Instructions  COVID testing ordered.  It will take between 2-7 days for test results.  Someone will contact you regarding abnormal results.    In the meantime: You should remain isolated in your home for 10 days from symptom onset AND greater than 72 hours after symptoms resolution (absence of fever without the use of fever-reducing medication and improvement in respiratory symptoms), whichever is longer Get plenty of rest and push fluids Tessalon Perles prescribed for cough Zyrtec for nasal congestion, runny nose, and/or sore throat Flonase for nasal congestion and runny  nose Prednisone was prescribed Use medications daily for symptom relief Use OTC medications like ibuprofen or tylenol as needed fever or pain Call or go to the ED if you have any new or worsening symptoms such as fever, worsening cough, shortness of breath, chest tightness, chest pain, turning blue, changes in mental status, etc...   Reviewed expectations re: course of current medical issues. Questions answered. Outlined signs and symptoms indicating need for more acute intervention. Patient verbalized understanding. After Visit Summary given.  Note: This document was prepared using Dragon voice recognition software and may include unintentional dictation errors.      Emerson Monte, [place] 07/04/20 1529

## 2020-07-04 NOTE — ED Triage Notes (Signed)
Chills, fatigue, decreased appetite since Thursday.  Has not had covid vaccine.

## 2020-07-04 NOTE — Discharge Instructions (Signed)
COVID testing ordered.  It will take between 2-7 days for test results.  Someone will contact you regarding abnormal results.    In the meantime: You should remain isolated in your home for 10 days from symptom onset AND greater than 72 hours after symptoms resolution (absence of fever without the use of fever-reducing medication and improvement in respiratory symptoms), whichever is longer Get plenty of rest and push fluids Tessalon Perles prescribed for cough Zyrtec for nasal congestion, runny nose, and/or sore throat Flonase for nasal congestion and runny nose Prednisone was prescribed Use medications daily for symptom relief Use OTC medications like ibuprofen or tylenol as needed fever or pain Call or go to the ED if you have any new or worsening symptoms such as fever, worsening cough, shortness of breath, chest tightness, chest pain, turning blue, changes in mental status, etc..Marland Kitchen

## 2020-07-05 LAB — NOVEL CORONAVIRUS, NAA: SARS-CoV-2, NAA: DETECTED — AB

## 2020-07-06 ENCOUNTER — Other Ambulatory Visit: Payer: Self-pay | Admitting: Oncology

## 2020-07-06 ENCOUNTER — Telehealth: Payer: Self-pay | Admitting: Oncology

## 2020-07-06 DIAGNOSIS — U071 COVID-19: Secondary | ICD-10-CM

## 2020-07-06 NOTE — Telephone Encounter (Signed)
Called to Discuss with patient about Covid symptoms and the use of regeneron, a monoclonal antibody infusion for those with mild to moderate Covid symptoms and at a high risk of hospitalization.     Pt is qualified for this infusion at the [facility] infusion center due to co-morbid conditions and/or a member of an at-risk group.    Past Medical History:  Diagnosis Date  . Crohn disease ([place])   . Depression     Spoke to patient and reviewed Mab infusion.  She would like to speak with her husband about the infusion and will call our clinic back when she would like to get scheduled.  Rulon Abide, AGNP-C [phone] ([place])

## 2020-07-06 NOTE — Progress Notes (Signed)
I connected by phone with Mrs. Speltz to discuss the potential use of an new treatment for mild to moderate COVID-19 viral infection in non-hospitalized patients.   This patient is a age/sex that meets the FDA criteria for Emergency Use Authorization of casirivimab\imdevimab.  Has a (+) direct SARS-CoV-2 viral test result 1. Has mild or moderate COVID-19  2. Is ? 55 years of age and weighs ? 40 kg 3. Is NOT hospitalized due to COVID-19 4. Is NOT requiring oxygen therapy or requiring an increase in baseline oxygen flow rate due to COVID-19 5. Is within 10 days of symptom onset 6. Has at least one of the high risk factor(s) for progression to severe COVID-19 and/or hospitalization as defined in EUA. ? Specific high risk criteria : Crohns disease   Symptom onset 07/01/20   I have spoken and communicated the following to the patient or parent/caregiver:   1. FDA has authorized the emergency use of casirivimab\imdevimab for the treatment of mild to moderate COVID-19 in adults and pediatric patients with positive results of direct SARS-CoV-2 viral testing who are 31 years of age and older weighing at least 40 kg, and who are at high risk for progressing to severe COVID-19 and/or hospitalization.   2. The significant known and potential risks and benefits of casirivimab\imdevimab, and the extent to which such potential risks and benefits are unknown.   3. Information on available alternative treatments and the risks and benefits of those alternatives, including clinical trials.   4. Patients treated with casirivimab\imdevimab should continue to self-isolate and use infection control measures (e.g., wear mask, isolate, social distance, avoid sharing personal items, clean and disinfect "high touch" surfaces, and frequent handwashing) according to CDC guidelines.    5. The patient or parent/caregiver has the option to accept or refuse casirivimab\imdevimab .   After reviewing this information with the  patient, The patient agreed to proceed with receiving casirivimab\imdevimab infusion and will be provided a copy of the Fact sheet prior to receiving the infusion.Rulon Abide, AGNP-C [phone] ([place])

## 2020-07-07 ENCOUNTER — Ambulatory Visit (HOSPITAL_COMMUNITY)
Admission: RE | Admit: 2020-07-07 | Discharge: 2020-07-07 | Disposition: A | Discharge status: Home / Self Care | Payer: HRSA Program | Source: Ambulatory Visit | Attending: Pulmonary Disease | Admitting: Pulmonary Disease

## 2020-07-07 DIAGNOSIS — U071 COVID-19: Secondary | ICD-10-CM | POA: Insufficient documentation

## 2020-07-07 MED ORDER — SODIUM CHLORIDE 0.9 % IV SOLN: INTRAVENOUS | Status: DC | PRN

## 2020-07-07 MED ORDER — DIPHENHYDRAMINE HCL 50 MG/ML IJ SOLN: 50.0000 mg | Freq: Once | INTRAMUSCULAR | Status: DC | PRN

## 2020-07-07 MED ORDER — SODIUM CHLORIDE 0.9 % IV SOLN
1200.0000 mg | Freq: Once | INTRAVENOUS | Status: AC
  Administered 2020-07-07: 1200 mg via INTRAVENOUS

## 2020-07-07 MED ORDER — EPINEPHRINE 0.3 MG/0.3ML IJ SOAJ: 0.3000 mg | Freq: Once | INTRAMUSCULAR | Status: DC | PRN

## 2020-07-07 MED ORDER — METHYLPREDNISOLONE SODIUM SUCC 125 MG IJ SOLR: 125.0000 mg | Freq: Once | INTRAMUSCULAR | Status: DC | PRN

## 2020-07-07 MED ORDER — FAMOTIDINE IN NACL 20-0.9 MG/50ML-% IV SOLN: 20.0000 mg | Freq: Once | INTRAVENOUS | Status: DC | PRN

## 2020-07-07 MED ORDER — ALBUTEROL SULFATE HFA 108 (90 BASE) MCG/ACT IN AERS: 2.0000 | INHALATION_SPRAY | Freq: Once | RESPIRATORY_TRACT | Status: DC | PRN

## 2020-07-07 NOTE — Progress Notes (Signed)
°  Diagnosis: COVID-19  Physician: Dr Asencion Noble  Procedure: Covid Infusion Clinic Med: casirivimab\imdevimab infusion - Provided patient with casirivimab\imdevimab fact sheet for patients, parents and caregivers prior to infusion.  Complications: No immediate complications noted.  Discharge: Discharged home   Dorene Sorrow 07/07/2020

## 2020-07-07 NOTE — Discharge Instructions (Signed)

## 2021-08-01 ENCOUNTER — Ambulatory Visit (INDEPENDENT_AMBULATORY_CARE_PROVIDER_SITE_OTHER): Payer: 59 | Admitting: Family Medicine

## 2021-08-01 ENCOUNTER — Other Ambulatory Visit: Payer: Self-pay

## 2021-08-01 ENCOUNTER — Encounter: Payer: Self-pay | Admitting: Family Medicine

## 2021-08-01 ENCOUNTER — Ambulatory Visit (HOSPITAL_COMMUNITY)
Admission: RE | Admit: 2021-08-01 | Discharge: 2021-08-01 | Disposition: A | Discharge status: Home / Self Care | Payer: 59 | Source: Ambulatory Visit | Attending: Family Medicine | Admitting: Family Medicine

## 2021-08-01 VITALS — BP 122/68 | HR 80 | Ht 66.0 in | Wt 106.0 lb

## 2021-08-01 DIAGNOSIS — K50919 Crohn's disease, unspecified, with unspecified complications: Secondary | ICD-10-CM

## 2021-08-01 DIAGNOSIS — Z114 Encounter for screening for human immunodeficiency virus [HIV]: Secondary | ICD-10-CM

## 2021-08-01 DIAGNOSIS — K50019 Crohn's disease of small intestine with unspecified complications: Secondary | ICD-10-CM

## 2021-08-01 DIAGNOSIS — M25552 Pain in left hip: Secondary | ICD-10-CM | POA: Diagnosis present

## 2021-08-01 DIAGNOSIS — I739 Peripheral vascular disease, unspecified: Secondary | ICD-10-CM | POA: Insufficient documentation

## 2021-08-01 DIAGNOSIS — Z720 Tobacco use: Secondary | ICD-10-CM | POA: Insufficient documentation

## 2021-08-01 DIAGNOSIS — Z1159 Encounter for screening for other viral diseases: Secondary | ICD-10-CM

## 2021-08-01 DIAGNOSIS — F4320 Adjustment disorder, unspecified: Secondary | ICD-10-CM | POA: Insufficient documentation

## 2021-08-01 DIAGNOSIS — M79605 Pain in left leg: Secondary | ICD-10-CM

## 2021-08-01 DIAGNOSIS — F17200 Nicotine dependence, unspecified, uncomplicated: Secondary | ICD-10-CM

## 2021-08-01 DIAGNOSIS — F4321 Adjustment disorder with depressed mood: Secondary | ICD-10-CM

## 2021-08-01 MED ORDER — BUPROPION HCL ER (XL) 150 MG PO TB24: 150.0000 mg | ORAL_TABLET | Freq: Every day | ORAL | 1 refills | Status: DC

## 2021-08-01 MED ORDER — NICOTINE 21 MG/24HR TD PT24: 21.0000 mg | MEDICATED_PATCH | Freq: Every day | TRANSDERMAL | 0 refills | Status: DC

## 2021-08-01 NOTE — Progress Notes (Signed)
SUBJECTIVE:   CHIEF COMPLAINT / HPI:   55 yo female who presents to establish care.  PMH Crohns disease- diagnosed in 2000, had been on Humira in the past but lost insurance, no medications since 2019, no flares since then either L hip pain- x1 year, no inciting fall/trauma/incident, sometimes right hip hurts as well, no history of fracture, no weakness, not currently taking anything for pain, hurts when walking from front of clinic to exam room Left foot numbness- noted for past year, worse after walking, sometimes discoloration of toes after walking foot feels cold, x1 year as well Adjustment disorder- sleeping 8 hours, poor appetite, sometimes wakes up and doesn't want to be here, denies active plan or intent to hurt herself, denies history of suicide attempt, has been on antidepressants in past but can't remember which ones, feels sluggish, denies anxiety  Social Hx Tobacco use- 1 ppd x 40  years, wanting to quit, has quit in past with pregnancy Alcohol use- occasional no heavy use Illicit substances- denies Lives with husband Dominica Severin, 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 MSK: mild tenderness to palpation over lateral left hip and anterior groin, negative leg roll test bilaterally, negative straight leg test bilaterally. No lumbar spine tenderness to palpation. Bilateral lumbar paraspinal musculature  tenderness to palpation. Feet: normal cap refill bilaterally, 2+ dorsalis pedis pulses palpable bilaterally, normal temperature of bilateral feet, no ulcers or skin breakdown/discoloration Neuro: Normal gait, moves all four extremities appropriately. Psych: tearful mood, depressed affect, denies SI, no active hallucinations   ASSESSMENT/PLAN:   Crohn's disease ([place]) - referral to GI today, not currently with flare but also no current medications, will get CBC/CMP today to establish baseline  Adjustment disorder - passive SI without plan or intent, safety planning includes her supportive husband and her faith/prayers when she has thoughts of not wanting to be around - denies history of mania or previous suicide attempt, denies history of eating disorder or seizure disorder - will start Wellbutrin once daily in AM as she also desires smoking cessation, discussed watching for anxiety, mania, or SI - referral to [facility] to help with establishing counseling  Left hip pain - will get X-ray, no signs of hernia on exam today, also getting baseline RFP to assess for CKD and then will discuss pain control based on results  Leg pain, anterior, left - with numbness and sometimes discoloration of feet sounding like claudication, makes me wonder about PAD and as she is smoker she is at risk for this, could also be related to hip pain although she states not always occurring at same time - will order ABI, + dorsalis pedis pulses today and no signs of critical limb ischemia, discussed these signs and reasons to go to ED - will also check lipid panel  Tobacco use disorder - motivated to quit, will start wellbutrin and nicotine patches  Recommend follow-up in 2-4 weeks to discuss healthcare maintenance items and follow up on problems listed above.  Lenoria Chime, MD [place]

## 2021-08-01 NOTE — Patient Instructions (Signed)
It was wonderful to see you today.  Please bring ALL of your medications with you to every visit.   Today we talked about:  - Medications for smoking cessation and depression sent to pharmacy - lab work today - xray at hospital   Thank you for choosing [facility].   Please call [phone] with any questions about today's appointment.  Please be sure to schedule follow up at the front  desk before you leave today.   Yehuda Savannah, MD  Family Medicine

## 2021-08-02 ENCOUNTER — Encounter: Payer: Self-pay | Admitting: Family Medicine

## 2021-08-02 LAB — CBC
Hematocrit: 38.5 % (ref 34.0–46.6)
Hemoglobin: 13.2 g/dL (ref 11.1–15.9)
MCH: 33.3 pg — ABNORMAL HIGH (ref 26.6–33.0)
MCHC: 34.3 g/dL (ref 31.5–35.7)
MCV: 97 fL (ref 79–97)
Platelets: 219 10*3/uL (ref 150–450)
RBC: 3.96 x10E6/uL (ref 3.77–5.28)
RDW: 13.2 % (ref 11.7–15.4)
WBC: 10.5 10*3/uL (ref 3.4–10.8)

## 2021-08-02 LAB — COMPREHENSIVE METABOLIC PANEL
ALT: 13 IU/L (ref 0–32)
AST: 17 IU/L (ref 0–40)
Albumin/Globulin Ratio: 1.8 (ref 1.2–2.2)
Albumin: 4.4 g/dL (ref 3.8–4.9)
Alkaline Phosphatase: 82 IU/L (ref 44–121)
BUN/Creatinine Ratio: 14 (ref 9–23)
BUN: 11 mg/dL (ref 6–24)
Bilirubin Total: 0.3 mg/dL (ref 0.0–1.2)
CO2: 25 mmol/L (ref 20–29)
Calcium: 10.1 mg/dL (ref 8.7–10.2)
Chloride: 101 mmol/L (ref 96–106)
Creatinine, Ser: 0.78 mg/dL (ref 0.57–1.00)
Globulin, Total: 2.4 g/dL (ref 1.5–4.5)
Glucose: 89 mg/dL (ref 70–99)
Potassium: 4.3 mmol/L (ref 3.5–5.2)
Sodium: 141 mmol/L (ref 134–144)
Total Protein: 6.8 g/dL (ref 6.0–8.5)
eGFR: 89 mL/min/{1.73_m2} (ref >59)

## 2021-08-02 LAB — LIPID PANEL
Chol/HDL Ratio: 2.4 ratio (ref 0.0–4.4)
Cholesterol, Total: 189 mg/dL (ref 100–199)
HDL: 78 mg/dL (ref >39)
LDL Chol Calc (NIH): 90 mg/dL (ref 0–99)
Triglycerides: 122 mg/dL (ref 0–149)
VLDL Cholesterol Cal: 21 mg/dL (ref 5–40)

## 2021-08-02 LAB — HCV AB W REFLEX TO QUANT PCR: HCV Ab: <0.1 s/co ratio (ref 0.0–0.9)

## 2021-08-02 LAB — HIV ANTIBODY (ROUTINE TESTING W REFLEX): HIV Screen 4th Generation wRfx: NONREACTIVE

## 2021-08-02 LAB — HCV INTERPRETATION

## 2021-08-02 NOTE — Assessment & Plan Note (Addendum)
-   passive SI without plan or intent, safety planning includes her supportive husband and her faith/prayers when she has thoughts of not wanting to be around - denies history of mania or previous suicide attempt, denies history of eating disorder or seizure disorder - will start Wellbutrin once daily in AM as she also desires smoking cessation, discussed watching for anxiety, mania, or SI - referral to [facility] to help with establishing counseling - also sent SI resources and therapy resources through patient portal

## 2021-08-02 NOTE — Assessment & Plan Note (Signed)
-   with numbness and sometimes discoloration of feet sounding like claudication, makes me wonder about PAD and as she is smoker she is at risk for this, could also be related to hip pain although she states not always occurring at same time - will order ABI, + dorsalis pedis pulses today and no signs of critical limb ischemia, discussed these signs and reasons to go to ED - will also check lipid panel

## 2021-08-02 NOTE — Assessment & Plan Note (Signed)
-   will get X-ray, no signs of hernia on exam today, also getting baseline RFP to assess for CKD and then will discuss pain control based on results

## 2021-08-02 NOTE — Assessment & Plan Note (Signed)
-   referral to GI today, not currently with flare but also no current medications, will get CBC/CMP today to establish baseline

## 2021-08-02 NOTE — Assessment & Plan Note (Signed)
-   motivated to quit, will start wellbutrin and nicotine patches

## 2021-08-11 ENCOUNTER — Ambulatory Visit (HOSPITAL_COMMUNITY): Payer: 59

## 2021-08-15 NOTE — Progress Notes (Signed)
    SUBJECTIVE:   CHIEF COMPLAINT / HPI:   Adjustment disorder- started on Wellbutrin 166m XL, tolerating well. Feels mood improving. Still with passive suicidal ideation, sometimes thoughts she doesn't want to be here, when she has these she things of her family and prays. Her husband and family are supportive and she would call them if the thoughts got worse. Is aware of 988 hotline. Does not have a plan or intent.  Tobacco use- working on quitting. Down to 1/2 ppd. Feels wellbutrin helps with the cravings.  Anterior left leg pain- getting ABI with vascular later today. Has pain after walking about 10 steps, better with rest. No discoloration of toes.  Left hip pain- doing home stretches. Tylenol doesn't really help. Not interested in PT at this time, pain limited due to above and wants to see results of ABIs.  PERTINENT  PMH / PSH: as above  OBJECTIVE:   BP (!) 136/53   Pulse 75   Ht 5' 6"  (1.676 m)   Wt 105 lb 6.4 oz (47.8 kg)   SpO2 96%   BMI 17.01 kg/m   General: A&O, NAD HEENT: No sign of trauma, EOM grossly intact Cardiac: RRR, no m/r/g Respiratory: CTAB, normal WOB, no w/c/r GI: Soft, NTTP, non-distended , + bowel sounds Extremities: NTTP, no peripheral edema. Neuro: Normal gait, moves all four extremities appropriately. Psych: Appropriate mood and affect   ASSESSMENT/PLAN:   Severe peripheral arterial disease ([facility]) - ABI today showing severe peripheral arterial disease, will start ASA 325 mg, atorvastatin 448m and referral to vascular surgery - called and discussed this with Ms HaChairesfter the visit and she is agreeable to plan - discussed most important thing for health going forward is smoking cessation- she is cutting back and less cravings on Wellbutrin, currently at 1/2 ppd - also discussed signs of critical limb ischemia and reasons to go to hospital  [place]- referralto [facility] place, given number of [facility] to call about appt, currently  asymptomatic  Adjustment disorder - symptoms improving on Wellbutrin, passive SI today but notes family, prayer as protective factors, discussed SI resources and 988 hotline, able to safety plan, no previous attempts - follow up in 2 weeks to reassess mood and could consider increasing to [id number]  Tobacco use disorder - less cravings, has decreased to 1/2 ppd - discussed in setting of severe PAD the importance of smoking cessation, has nicotine patches to try as well  Left hip pain - not interested in PT at this time, will try diclofenac gel and home exercises for pain relief   T dap booster given today. Info given to call and schedule screening mammogram- she denies masses, breast pain, breast discharge.  F/u in 2 weeks for pap smear and follow up on chronic issues.  MarLenoria ChimeD [place]

## 2021-08-16 ENCOUNTER — Other Ambulatory Visit: Payer: Self-pay

## 2021-08-16 ENCOUNTER — Ambulatory Visit (HOSPITAL_BASED_OUTPATIENT_CLINIC_OR_DEPARTMENT_OTHER)
Admission: RE | Admit: 2021-08-16 | Discharge: 2021-08-16 | Disposition: A | Discharge status: Home / Self Care | Payer: 59 | Source: Ambulatory Visit | Attending: Family Medicine | Admitting: Family Medicine

## 2021-08-16 ENCOUNTER — Other Ambulatory Visit (HOSPITAL_COMMUNITY): Payer: Self-pay | Admitting: Family Medicine

## 2021-08-16 ENCOUNTER — Ambulatory Visit (HOSPITAL_COMMUNITY)
Admission: RE | Admit: 2021-08-16 | Discharge: 2021-08-16 | Disposition: A | Discharge status: Home / Self Care | Payer: 59 | Source: Ambulatory Visit | Attending: Family Medicine | Admitting: Family Medicine

## 2021-08-16 ENCOUNTER — Encounter: Payer: Self-pay | Admitting: Family Medicine

## 2021-08-16 ENCOUNTER — Ambulatory Visit (INDEPENDENT_AMBULATORY_CARE_PROVIDER_SITE_OTHER): Payer: 59 | Admitting: Family Medicine

## 2021-08-16 VITALS — BP 136/53 | HR 75 | Ht 66.0 in | Wt 105.4 lb

## 2021-08-16 DIAGNOSIS — K50919 Crohn's disease, unspecified, with unspecified complications: Secondary | ICD-10-CM

## 2021-08-16 DIAGNOSIS — M79605 Pain in left leg: Secondary | ICD-10-CM | POA: Insufficient documentation

## 2021-08-16 DIAGNOSIS — Z23 Encounter for immunization: Secondary | ICD-10-CM

## 2021-08-16 DIAGNOSIS — F4321 Adjustment disorder with depressed mood: Secondary | ICD-10-CM

## 2021-08-16 DIAGNOSIS — I739 Peripheral vascular disease, unspecified: Secondary | ICD-10-CM

## 2021-08-16 DIAGNOSIS — F17200 Nicotine dependence, unspecified, uncomplicated: Secondary | ICD-10-CM

## 2021-08-16 DIAGNOSIS — M25552 Pain in left hip: Secondary | ICD-10-CM

## 2021-08-16 DIAGNOSIS — Z1231 Encounter for screening mammogram for malignant neoplasm of breast: Secondary | ICD-10-CM

## 2021-08-16 MED ORDER — ASPIRIN EC 325 MG PO TBEC: 325.0000 mg | DELAYED_RELEASE_TABLET | Freq: Every day | ORAL | 0 refills | Status: DC

## 2021-08-16 MED ORDER — ATORVASTATIN CALCIUM 40 MG PO TABS: 40.0000 mg | ORAL_TABLET | Freq: Every day | ORAL | 3 refills | Status: DC

## 2021-08-16 MED ORDER — DICLOFENAC SODIUM 1 % EX GEL: 4.0000 g | Freq: Four times a day (QID) | CUTANEOUS | 2 refills | Status: AC

## 2021-08-16 NOTE — Progress Notes (Signed)
Bilateral lower extremity arterial duplex completed. Refer to "CV Proc" under chart review to view preliminary results.  08/16/2021 2:34 PM Kelby Aline., MHA, RVT, RDCS, RDMS

## 2021-08-16 NOTE — Patient Instructions (Signed)
It was wonderful to see you today.  Please bring ALL of your medications with you to every visit.   Today we talked about:  - Tetanus booster given today - We have referred you to [facility], their number is [phone]) [phone] if you want to call about getting an appointment - Please make a follow up in 2 weeks to check on your mood and for your pap smear - Call and schedule a mammogram - Let me know if you change your mind about a conuseling referral - I have sent Voltaren gel into your pharmacy to help with your hip pain   Thank you for choosing [facility].   Please call [phone] with any questions about today's appointment.  Please be sure to schedule follow up at the front  desk before you leave today.   Yehuda Savannah, MD  Family Medicine

## 2021-08-16 NOTE — Assessment & Plan Note (Signed)
-   less cravings, has decreased to 1/2 ppd - discussed in setting of severe PAD the importance of smoking cessation, has nicotine patches to try as well

## 2021-08-16 NOTE — Assessment & Plan Note (Signed)
-   referralto GI place, given number of [facility] to call about appt, currently asymptomatic

## 2021-08-16 NOTE — Assessment & Plan Note (Signed)
-   symptoms improving on Wellbutrin, passive SI today but notes family, prayer as protective factors, discussed SI resources and 988 hotline, able to safety plan, no previous attempts - follow up in 2 weeks to reassess mood and could consider increasing to 321m daily

## 2021-08-16 NOTE — Assessment & Plan Note (Signed)
-   not interested in PT at this time, will try diclofenac gel and home exercises for pain relief

## 2021-08-16 NOTE — Assessment & Plan Note (Addendum)
-   ABI today showing severe peripheral arterial disease, will start ASA 325 mg, atorvastatin 32m, and referral to vascular surgery - called and discussed this with Melissa HMeadowafter the visit and she is agreeable to plan - discussed most important thing for health going forward is smoking cessation- she is cutting back and less cravings on Wellbutrin, currently at 1/2 ppd - also discussed signs of critical limb ischemia and reasons to go to hospital

## 2021-08-23 ENCOUNTER — Other Ambulatory Visit: Payer: Self-pay | Admitting: Family Medicine

## 2021-08-23 DIAGNOSIS — F4321 Adjustment disorder with depressed mood: Secondary | ICD-10-CM

## 2021-08-28 NOTE — Progress Notes (Signed)
[facility] [place]  ASSESSMENT / PLAN: Melissa Chaney is a 55 y.o. 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  Patient counseled patients with asymptomatic peripheral arterial disease or claudication have a 1-2% risk of developing chronic limb threatening ischemia, but a 15-30% risk of mortality in the next 5 years. Intervention should only be considered for medically optimized patients with disabling symptoms.   Recommend the following which can slow the progression of atherosclerosis and reduce the risk of major adverse cardiac / limb events:  Complete cessation from all tobacco products. Blood glucose control with goal A1c < 7%. Blood pressure control with goal blood pressure < 140/90 mmHg. Lipid reduction therapy with goal LDL-C <100 mg/dL (<70 if symptomatic from PAD).  Aspirin 23m PO QD.  Atorvastatin 40-80mg PO QD (or other "high intensity" statin therapy).  Symptoms are quite disabling.  Her left foot is bordering on ischemic rest pain.  I think intervention is warranted.  We discussed the options for intervention including a minimally invasive approach (aortoiliac stenting) versus open approach (aortobifemoral bypass grafting).  I counseled her that open surgery is more durable, but carries significantly more risk including: death, stroke, pneumonia, respiratory failure, renal failure, MI.  We will see what her CT angiogram looks like before discussing further options.  We will plan to discuss the results with her by phone as soon as they are available.  CHIEF COMPLAINT: Bilateral leg pain  HISTORY OF PRESENT ILLNESS: Melissa TALAMANTEZis a 55y.o. female who presents to clinic for evaluation of bilateral leg pain.  The patient reports this is been slowly progressive over the past year.  The patient reports fairly typical symptoms of disabling claudication in her thighs and calves.  She also  has pain in her left foot that will occasionally awaken her at night.  He has no ulcers about her feet.  VASCULAR SURGICAL HISTORY: none  VASCULAR RISK FACTORS: Negative history of stroke / transient ischemic attack. Negative history of coronary artery disease.  Negative history of diabetes mellitus.  Positive history of smoking. Not actively smoking. Negative history of hypertension.  Negative history of chronic kidney disease.  Negative history of chronic obstructive pulmonary disease.  FUNCTIONAL STATUS: ECOG performance status: (1) Restricted in physically strenuous activity, ambulatory and able to do work of light nature Ambulatory status: Ambulatory within the community with limits  Past Medical History:  Diagnosis Date   Crohn disease ([place]    Depression     Past Surgical History:  Procedure Laterality Date   ABDOMINAL SURGERY      Family History  Problem Relation Age of Onset   Crohn's disease Mother    Crohn's disease Father    Crohn's disease Sister    Ovarian cancer Maternal Grandmother    Diabetes Paternal Grandmother    Diabetes Son     Social History   Socioeconomic History   Marital status: Married    Spouse name: Not on file   Number of children: 2   Years of education: Not on file   Highest education level: Not on file  Occupational History   Occupation: bSecretary/administrator Tobacco Use   Smoking status: Every Day    Packs/day: 1.00    Types: Cigarettes   Smokeless tobacco: Never  Vaping Use   Vaping Use: Never used  Substance and Sexual Activity   Alcohol use: Yes    Comment: occ   Drug  use: No   Sexual activity: Not on file  Other Topics Concern   Not on file  Social History Narrative   Not on file   Social Determinants of Health   Financial Resource Strain: Not on file  Food Insecurity: Not on file  Transportation Needs: Not on file  Physical Activity: Not on file  Stress: Not on file  Social Connections: Not on file  Intimate  Partner Violence: Not on file    Allergies  Allergen Reactions   Codeine Itching    Current Outpatient Medications  Medication Sig Dispense Refill   aspirin EC 325 MG tablet Take 1 tablet (325 mg total) by mouth daily. 30 tablet 0   atorvastatin (LIPITOR) 40 MG tablet Take 1 tablet (40 mg total) by mouth daily. 90 tablet 3   buPROPion (WELLBUTRIN XL) 150 MG 24 hr tablet TAKE 1 TABLET BY MOUTH EVERY DAY 90 tablet 1   diclofenac Sodium (VOLTAREN) 1 % GEL Apply 4 g topically 4 (four) times daily. As needed for hip pain 50 g 2   nicotine (NICODERM CQ - DOSED IN MG/24 HOURS) 21 mg/24hr patch Place 1 patch (21 mg total) onto the skin daily. 28 patch 0   No current facility-administered medications for this visit.    REVIEW OF SYSTEMS:  [X]  denotes positive finding, [ ]  denotes negative finding Cardiac  Comments:  Chest pain or chest pressure:    Shortness of breath upon exertion:    Short of breath when lying flat:    Irregular heart rhythm:        Vascular    Pain in calf, thigh, or hip brought on by ambulation: x   Pain in feet at night that wakes you up from your sleep:     Blood clot in your veins:    Leg swelling:         Pulmonary    Oxygen at home:    Productive cough:     Wheezing:         Neurologic    Sudden weakness in arms or legs:  x   Sudden numbness in arms or legs:  x   Sudden onset of difficulty speaking or slurred speech:    Temporary loss of vision in one eye:     Problems with dizziness:         Gastrointestinal    Blood in stool:     Vomited blood:         Genitourinary    Burning when urinating:     Blood in urine:        Psychiatric    Major depression:         Hematologic    Bleeding problems:    Problems with blood clotting too easily:        Skin    Rashes or ulcers:        Constitutional    Fever or chills:      PHYSICAL EXAM Vitals:   08/29/21 0856  BP: 130/74  Pulse: 81  Resp: 14  Temp: 97.9 F (36.6 C)  TempSrc:  Temporal  SpO2: 99%  Weight: 105 lb 6.4 oz (47.8 kg)  Height: 5' 6"  (1.676 m)    Constitutional: well appearing. no distress. Thin - marginally nourished.  Neurologic: CN intact. No focal findings. No sensory loss. Psychiatric:  Mood and affect symmetric and appropriate. Eyes:  No icterus. No conjunctival pallor. Ears, nose, throat:  mucous membranes moist. Midline trachea.  Cardiac:  regular rate and rhythm.  Respiratory:  unlabored. Abdominal:  soft, non-tender, non-distended.  Peripheral vascular: no palpable femoral pulses. No palpable popliteal pulses. No palpable pedal pulses. Extremity: No edema. No cyanosis. No pallor.  Skin: No gangrene. No ulceration.  Lymphatic: No Stemmer's sign. No palpable lymphadenopathy.  PERTINENT LABORATORY AND RADIOLOGIC DATA  Most recent CBC CBC Latest Ref Rng & Units 08/01/2021 10/16/2018 07/10/2018  WBC 3.4 - 10.8 x10E3/uL 10.5 13.6(H) 10.1  Hemoglobin 11.1 - 15.9 g/dL 13.2 13.3 13.7  Hematocrit 34.0 - 46.6 % 38.5 39.4 40.5  Platelets 150 - 450 x10E3/uL 219 240.0 187.0     Most recent CMP CMP Latest Ref Rng & Units 08/01/2021 11/09/2017 03/19/2017  Glucose 70 - 99 mg/dL 89 89 182(H)  BUN 6 - 24 mg/dL 11 9 6   Creatinine 0.57 - 1.00 mg/dL 0.78 0.77 0.76  Sodium 134 - 144 mmol/L 141 142 140  Potassium 3.5 - 5.2 mmol/L 4.3 3.6 3.7  Chloride 96 - 106 mmol/L 101 102 102  CO2 20 - 29 mmol/L 25 32 33(H)  Calcium 8.7 - 10.2 mg/dL 10.1 9.9 10.2  Total Protein 6.0 - 8.5 g/dL 6.8 6.9 7.1  Total Bilirubin 0.0 - 1.2 mg/dL 0.3 0.6 0.6  Alkaline Phos 44 - 121 IU/L 82 77 68  AST 0 - 40 IU/L 17 13 13   ALT 0 - 32 IU/L 13 8 9    LDL Chol Calc (NIH)  Date Value Ref Range Status  08/01/2021 90 0 - 99 mg/dL Final     Vascular Imaging:  +-------+-----------+-----------+------------+------------+  ABI/TBIToday's ABIToday's TBIPrevious ABIPrevious TBI  +-------+-----------+-----------+------------+------------+  Right  0.32                                             +-------+-----------+-----------+------------+------------+  Left   0.33       0.10                                 +-------+-----------+-----------+------------+------------+    Bilateral lower extremity arterial duplex: Monophasic waveforms throughout bilateral lower extremities,  suggestive of proximal obstruction.   [name]. [name], MD [facility] Phone Number: [phone] 08/29/2021 9:08 AM  Total time spent on preparing this encounter including chart review, data review, collecting history, examining the patient, coordinating care for this new patient, 60 minutes.  Portions of this report may have been transcribed using voice recognition software.  Every effort has been made to ensure accuracy; however, inadvertent computerized transcription errors may still be present.

## 2021-08-29 ENCOUNTER — Other Ambulatory Visit: Payer: Self-pay

## 2021-08-29 ENCOUNTER — Ambulatory Visit (INDEPENDENT_AMBULATORY_CARE_PROVIDER_SITE_OTHER): Payer: 59 | Admitting: Vascular Surgery

## 2021-08-29 ENCOUNTER — Encounter: Payer: Self-pay | Admitting: Vascular Surgery

## 2021-08-29 VITALS — BP 130/74 | HR 81 | Temp 97.9°F | Resp 14 | Ht 66.0 in | Wt 105.4 lb

## 2021-08-29 DIAGNOSIS — M79605 Pain in left leg: Secondary | ICD-10-CM

## 2021-08-29 DIAGNOSIS — I739 Peripheral vascular disease, unspecified: Secondary | ICD-10-CM

## 2021-08-29 DIAGNOSIS — I7409 Other arterial embolism and thrombosis of abdominal aorta: Secondary | ICD-10-CM

## 2021-09-08 ENCOUNTER — Other Ambulatory Visit: Payer: Self-pay | Admitting: Family Medicine

## 2021-09-08 DIAGNOSIS — I739 Peripheral vascular disease, unspecified: Secondary | ICD-10-CM

## 2021-09-08 NOTE — Progress Notes (Signed)
    SUBJECTIVE:   CHIEF COMPLAINT / HPI:   Severe PAD- saw vascular surgery, getting CT angio 09/13/21. On aspirin and atorvastatin.  Still having pain after a couple of steps, will sometimes have a bluish discoloration of the toes but no turning black.  Depressed mood- on wellbutrin 173m XL daily. PHQ-9 is 10 today, improved, denies SI.  Denies anxiety or trouble falling asleep with the Wellbutrin.  Notes she sometimes wakes up at night.  Due for her Pap smear today  PERTINENT  PMH / PSH: severe PAD, depression, tobacco use  OBJECTIVE:   BP 114/64   Pulse 78   Ht 5' 6"  (1.676 m)   Wt 106 lb 6.4 oz (48.3 kg)   SpO2 98%   BMI 17.17 kg/m   General: alert & oriented, no apparent distress, well groomed HEENT: normocephalic, atraumatic, EOM grossly intact, oral mucosa moist, neck supple Respiratory: normal respiratory effort GI: non-distended Skin: no rashes, no jaundice Psych: appropriate mood and affect Left foot-normal cap refill, no black or bluish discoloration of toes, no hair growth  GU: Normal appearance of labia majora and minora, without lesions. Vagina tissue pink, moist, without lesions or abrasions. Cervix normal appearance, non-friable, without discharge from os.  Bimanual exam normal without masses or lesions, cervix mobile.  SMarcelle Smiling CMA present as chaperone. ASSESSMENT/PLAN:   Severe peripheral arterial disease ([place] - Has follow-up CT angiogram and follow-up appoint with vascular surgeon - Continue aspirin and atorvastatin - Discussed signs and symptoms of critical limb ischemia and reasons to go to the ED  Tobacco use disorder - She is down to smoking half pack per day, congratulated patient, states Wellbutrin has been helping - Using nicotine patch 1 patch per day - Discussed setting specific goals such as weaning down to 2 cigarettes/week, or using alternating cigarettes and gum or lozenge to decrease total amount - Patient is in motivated phase  and continuing to cut down - Also discussed picking specific quit date as a technique  Adjustment disorder - Mood improved, PHQ-9 of 10 today without SI, she states she feels she is doing well and does not want to increase or change her Wellbutrin at this time - Denies excess anxiety or trouble sleeping with the Wellbutrin  Encounter for Papanicolaou smear for cervical cancer screening - Pap smear completed today, will call patient with results   Declines shingles and COVID vaccination.  MLenoria Chime MD [facility]

## 2021-09-09 ENCOUNTER — Ambulatory Visit (INDEPENDENT_AMBULATORY_CARE_PROVIDER_SITE_OTHER): Payer: 59 | Admitting: Family Medicine

## 2021-09-09 ENCOUNTER — Other Ambulatory Visit (HOSPITAL_COMMUNITY)
Admission: RE | Admit: 2021-09-09 | Discharge: 2021-09-09 | Disposition: A | Discharge status: Home / Self Care | Payer: 59 | Source: Ambulatory Visit | Attending: Family Medicine | Admitting: Family Medicine

## 2021-09-09 ENCOUNTER — Encounter: Payer: Self-pay | Admitting: Family Medicine

## 2021-09-09 ENCOUNTER — Other Ambulatory Visit: Payer: Self-pay

## 2021-09-09 VITALS — BP 114/64 | HR 78 | Ht 66.0 in | Wt 106.4 lb

## 2021-09-09 DIAGNOSIS — Z124 Encounter for screening for malignant neoplasm of cervix: Secondary | ICD-10-CM | POA: Insufficient documentation

## 2021-09-09 DIAGNOSIS — I739 Peripheral vascular disease, unspecified: Secondary | ICD-10-CM | POA: Diagnosis not present

## 2021-09-09 DIAGNOSIS — F17200 Nicotine dependence, unspecified, uncomplicated: Secondary | ICD-10-CM | POA: Diagnosis not present

## 2021-09-09 DIAGNOSIS — F4321 Adjustment disorder with depressed mood: Secondary | ICD-10-CM | POA: Diagnosis not present

## 2021-09-09 NOTE — Assessment & Plan Note (Signed)
-   Pap smear completed today, will call patient with results

## 2021-09-09 NOTE — Assessment & Plan Note (Signed)
-   She is down to smoking half pack per day, congratulated patient, states Wellbutrin has been helping - Using nicotine patch 1 patch per day - Discussed setting specific goals such as weaning down to 2 cigarettes/week, or using alternating cigarettes and gum or lozenge to decrease total amount - Patient is in motivated phase and continuing to cut down - Also discussed picking specific quit date as a technique

## 2021-09-09 NOTE — Assessment & Plan Note (Signed)
-   Mood improved, PHQ-9 of 10 today without SI, she states she feels she is doing well and does not want to increase or change her Wellbutrin at this time - Denies excess anxiety or trouble sleeping with the Wellbutrin

## 2021-09-09 NOTE — Patient Instructions (Addendum)
It was wonderful to see you today.  Please bring ALL of your medications with you to every visit.   Today we talked about:  - We did your pap smear today, will call or message with the results - You have follow up with your vascular surgeon for the CT angiogram, but in meantime if toes turn blue or black or severe pain in foot that doesn't resolve with rest go to ED immediately! - Please make a follow up in 4-6 weeks   Thank you for choosing [facility].   Please call [phone] with any questions about today's appointment.  Please be sure to schedule follow up at the front  desk before you leave today.   Yehuda Savannah, MD  Family Medicine

## 2021-09-09 NOTE — Assessment & Plan Note (Signed)
-   Has follow-up CT angiogram and follow-up appoint with vascular surgeon - Continue aspirin and atorvastatin - Discussed signs and symptoms of critical limb ischemia and reasons to go to the ED

## 2021-09-13 ENCOUNTER — Other Ambulatory Visit: Payer: Self-pay

## 2021-09-13 ENCOUNTER — Ambulatory Visit
Admission: RE | Admit: 2021-09-13 | Discharge: 2021-09-13 | Disposition: A | Discharge status: Home / Self Care | Payer: 59 | Source: Ambulatory Visit | Attending: Vascular Surgery | Admitting: Vascular Surgery

## 2021-09-13 DIAGNOSIS — I739 Peripheral vascular disease, unspecified: Secondary | ICD-10-CM

## 2021-09-13 DIAGNOSIS — I7409 Other arterial embolism and thrombosis of abdominal aorta: Secondary | ICD-10-CM

## 2021-09-13 DIAGNOSIS — M79605 Pain in left leg: Secondary | ICD-10-CM

## 2021-09-13 MED ORDER — IOPAMIDOL (ISOVUE-300) INJECTION 61%
125.0000 mL | Freq: Once | INTRAVENOUS | Status: AC | PRN
  Administered 2021-09-13: 125 mL via INTRAVENOUS

## 2021-09-16 LAB — CYTOLOGY - PAP
Comment: NEGATIVE
Diagnosis: NEGATIVE
High risk HPV: NEGATIVE

## 2021-09-20 ENCOUNTER — Ambulatory Visit (INDEPENDENT_AMBULATORY_CARE_PROVIDER_SITE_OTHER): Payer: 59 | Admitting: Vascular Surgery

## 2021-09-20 ENCOUNTER — Other Ambulatory Visit: Payer: Self-pay

## 2021-09-20 ENCOUNTER — Encounter: Payer: Self-pay | Admitting: Vascular Surgery

## 2021-09-20 DIAGNOSIS — I7409 Other arterial embolism and thrombosis of abdominal aorta: Secondary | ICD-10-CM

## 2021-09-20 NOTE — Progress Notes (Signed)
Personally reviewed CT angiogram results in detail. I called Melissa Chaney and her husband today to discuss the results. She has TASC D aortoiliac occlusive disease. Given her young age, good functional status, and severity of disease I think an aortobifemoral bypass graft is best for her. Complicating things is her history of Crohn's disease and abdominal surgery. I quoted her a risk of stroke, respiratory failure, myocardial infarction, renal dysfunction, and even death. I also counseled her that her history of abdominal surgery increases the risk of bowel injury which would require aborting the procedure. She and her husband had many excellent questions and are amenable to proceeding. We will ask her to see cardiology for preoperative risk stratification. We will plan for lysis of adhesions, aortobifemoral bypass grafting and bilateral femoral endarterectomy as soon as both my and Dr. Ainsley Spinner schedule allows.  [name]. [name], MD [facility] Phone Number: [phone] 09/20/2021 10:47 AM

## 2021-09-21 ENCOUNTER — Other Ambulatory Visit: Payer: Self-pay

## 2021-09-21 ENCOUNTER — Ambulatory Visit (INDEPENDENT_AMBULATORY_CARE_PROVIDER_SITE_OTHER): Payer: 59 | Admitting: Internal Medicine

## 2021-09-21 ENCOUNTER — Encounter: Payer: Self-pay | Admitting: Internal Medicine

## 2021-09-21 ENCOUNTER — Ambulatory Visit: Payer: 59

## 2021-09-21 VITALS — BP 130/90 | HR 53 | Ht 66.0 in | Wt 106.0 lb

## 2021-09-21 DIAGNOSIS — R0789 Other chest pain: Secondary | ICD-10-CM | POA: Insufficient documentation

## 2021-09-21 DIAGNOSIS — I7409 Other arterial embolism and thrombosis of abdominal aorta: Secondary | ICD-10-CM

## 2021-09-21 DIAGNOSIS — E785 Hyperlipidemia, unspecified: Secondary | ICD-10-CM | POA: Diagnosis not present

## 2021-09-21 DIAGNOSIS — Z01810 Encounter for preprocedural cardiovascular examination: Secondary | ICD-10-CM

## 2021-09-21 DIAGNOSIS — Z720 Tobacco use: Secondary | ICD-10-CM

## 2021-09-21 DIAGNOSIS — Z01818 Encounter for other preprocedural examination: Secondary | ICD-10-CM | POA: Insufficient documentation

## 2021-09-21 NOTE — Patient Instructions (Signed)
Medication Instructions:   Your physician recommends that you continue on your current medications as directed. Please refer to the Current Medication list given to you today.  *If you need a refill on your cardiac medications before your next appointment, please call your pharmacy*   Lab Work:  None ordered  Testing/Procedures:  Bells  Your caregiver has ordered a Clinical cytogeneticist) Stress Test with nuclear imaging. The purpose of this test is to evaluate the blood supply to your heart muscle. This procedure is referred to as a "Non-Invasive Stress Test." This is because other than having an IV started in your vein, nothing is inserted or "invades" your body. Cardiac stress tests are done to find areas of poor blood flow to the heart by determining the extent of coronary artery disease (CAD). Some patients exercise on a treadmill, which naturally increases the blood flow to your heart, while others who are  unable to walk on a treadmill due to physical limitations have a pharmacologic/chemical stress agent called Lexiscan . This medicine will mimic walking on a treadmill by temporarily increasing your coronary blood flow.   Please note: these test may take anywhere between 2-4 hours to complete  PLEASE REPORT TO [facility] AT THE FIRST DESK WILL DIRECT YOU WHERE TO GO  Date of Procedure:_____________________________________  Arrival Time for Procedure:______________________________    PLEASE NOTIFY THE OFFICE AT LEAST 24 HOURS IN ADVANCE IF YOU ARE UNABLE TO KEEP YOUR APPOINTMENT.  [phone] AND  PLEASE NOTIFY NUCLEAR MEDICINE AT [facility] AT LEAST 24 HOURS IN ADVANCE IF YOU ARE UNABLE TO KEEP YOUR APPOINTMENT. [phone]  How to prepare for your Myoview test:  Do not eat or drink after midnight No caffeine for 24 hours prior to test No smoking 24 hours prior to test. Your medication may be taken with water.  If your doctor stopped a  medication because of this test, do not take that medication. Ladies, please do not wear dresses.  Skirts or pants are appropriate. Please wear a short sleeve shirt. No perfume, cologne or lotion.   Follow-Up: At [facility], you and your health needs are our priority.  As part of our continuing mission to provide you with exceptional heart care, we have created designated Provider Care Teams.  These Care Teams include your primary Cardiologist (physician) and Advanced Practice Providers (APPs -  Physician Assistants and Nurse Practitioners) who all work together to provide you with the care you need, when you need it.  We recommend signing up for the patient portal called "MyChart".  Sign up information is provided on this After Visit Summary.  MyChart is used to connect with patients for Virtual Visits (Telemedicine).  Patients are able to view lab/test results, encounter notes, upcoming appointments, etc.  Non-urgent messages can be sent to your provider as well.   To learn more about what you can do with MyChart, go to [email or web address].    Your next appointment:    Follow up as needed

## 2021-09-21 NOTE — Progress Notes (Signed)
New Outpatient Visit Date: 09/21/2021  Referring Provider: Jamelle Haring, MD [facility]  Chief Complaint: Preop evaluation  HPI:  Ms. Richner is a 55 y.o. female who is being seen today for preop evaluation in the setting of aortoiliac occlusive disease and lifestyle limiting claudication in both legs at the request of Dr. [name]. She has a history of PAD with aortoiliac disease, Crohn's disease, and depression.  She reports worsening bilateral leg and hip/gluteal pain over the last year, now present with minimal activity and occasionally even at rest.  Recent aortic CT with runoff showed severe aortoiliac disease with bilateral iliac artery occlusions.  This pain has significantly limited her activity.  Ms. Copelin denies a history of heart disease or prior testing.  She notes occasional tightening in the chest, describing the sensation as if something were stuck in her chest and will not move.  It is not associated with any particular activity, specifically not with exertion or eating.  She denies shortness of breath, palpitations, lightheadedness, and edema.  --------------------------------------------------------------------------------------------------  Cardiovascular History & Procedures: Cardiovascular Problems: PAD Atypical chest pain  Risk Factors: PAD and tobacco use  Cath/PCI: None  CV Surgery: None  EP Procedures and Devices: None  Non-Invasive Evaluation(s): None  Recent CV Pertinent Labs: Lab Results  Component Value Date   CHOL 189 08/01/2021   HDL 78 08/01/2021   LDLCALC 90 08/01/2021   TRIG 122 08/01/2021   CHOLHDL 2.4 08/01/2021   K 4.3 08/01/2021   BUN 11 08/01/2021   CREATININE 0.78 08/01/2021    --------------------------------------------------------------------------------------------------  Past Medical History:  Diagnosis Date   Crohn disease ([place])    Depression    PAD (peripheral artery disease) ([facility])     Past  Surgical History:  Procedure Laterality Date   ABDOMINAL SURGERY      Current Meds  Medication Sig   aspirin 325 MG EC tablet TAKE 1 TABLET BY MOUTH EVERY DAY   atorvastatin (LIPITOR) 40 MG tablet Take 1 tablet (40 mg total) by mouth daily.   buPROPion (WELLBUTRIN XL) 150 MG 24 hr tablet TAKE 1 TABLET BY MOUTH EVERY DAY   diclofenac Sodium (VOLTAREN) 1 % GEL Apply 4 g topically 4 (four) times daily. As needed for hip pain   nicotine (NICODERM CQ - DOSED IN MG/24 HOURS) 21 mg/24hr patch Place 21 mg onto the skin daily.    Allergies: Codeine  Social History   Tobacco Use   Smoking status: Every Day    Packs/day: 0.50    Years: 40.00    Pack years: 20.00    Types: Cigarettes   Smokeless tobacco: Never  Vaping Use   Vaping Use: Never used  Substance Use Topics   Alcohol use: Yes    Comment: occ   Drug use: No    Family History  Problem Relation Age of Onset   Crohn's disease Mother    Hyperlipidemia Father    Heart failure Father    Heart attack Father 41   Crohn's disease Father    Crohn's disease Sister    Ovarian cancer Maternal Grandmother    Diabetes Paternal Grandmother    Diabetes Son     Review of Systems: A 12-system review of systems was performed and was negative except as noted in the HPI.  --------------------------------------------------------------------------------------------------  Physical Exam: BP 130/90 (BP Location: Right Arm, Patient Position: Sitting, Cuff Size: Normal)   Pulse (!) 53   Ht 5' 6"  (1.676 m)   Wt 106 lb (  48.1 kg)   SpO2 98%   BMI 17.11 kg/m   General: NAD.  Accompanied by her husband. HEENT: No conjunctival pallor or scleral icterus. Facemask in place. Neck: Supple without lymphadenopathy, thyromegaly, JVD, or HJR. No carotid bruit. Lungs: Normal work of breathing. Clear to auscultation bilaterally without wheezes or crackles. Heart: Bradycardic but regular without murmurs, rubs, or gallops. Non-displaced PMI. Abd:  Bowel sounds present.  Soft with mild diffuse tenderness.  No rebound or guarding.  No hepatosplenomegaly. Ext: No lower extremity edema.  2+ radial and trace pedal pulses bilaterally. Skin: Warm and dry without rash. Neuro: CNIII-XII intact. Strength and fine-touch sensation intact in upper and lower extremities bilaterally. Psych: Normal mood and affect.  EKG: Sinus bradycardia (heart rate 53 bpm) with possible left atrial enlargement.  Lab Results  Component Value Date   WBC 10.5 08/01/2021   HGB 13.2 08/01/2021   HCT 38.5 08/01/2021   MCV 97 08/01/2021   PLT 219 08/01/2021    Lab Results  Component Value Date   NA 141 08/01/2021   K 4.3 08/01/2021   CL 101 08/01/2021   CO2 25 08/01/2021   BUN 11 08/01/2021   CREATININE 0.78 08/01/2021   GLUCOSE 89 08/01/2021   ALT 13 08/01/2021    Lab Results  Component Value Date   CHOL 189 08/01/2021   HDL 78 08/01/2021   LDLCALC 90 08/01/2021   TRIG 122 08/01/2021   CHOLHDL 2.4 08/01/2021    --------------------------------------------------------------------------------------------------  ASSESSMENT AND PLAN: Atypical chest pain and preoperative cardiovascular evaluation: Ms. Hosack denies a history of heart disease but has known PAD as well as history of tobacco use (ongoing but trying to quit).  Given her atypical chest pain and inability to achieve 4 METS of activity due to claudication, I have recommended that we obtain a pharmacologic myocardial perfusion stress test in anticipation of her undergoing high risk surgery (aortobifemoral bypass).  As long as there are no high risk features on her stress test, I think it would be reasonable for her to proceed with surgery as planned.  Hyperlipidemia: In the setting of significant aortoiliac disease, I recommend continued high intensity statin therapy with target LDL less than 70 (noted to be 90 on last check in September).  Tobacco abuse: I congratulated Ms. Flom on cutting  down on her smoking and trying to quit.  I encouraged her to keep working on this.  Shared Decision Making/Informed Consent The risks [chest pain, shortness of breath, cardiac arrhythmias, dizziness, blood pressure fluctuations, myocardial infarction, stroke/transient ischemic attack, nausea, vomiting, allergic reaction, radiation exposure, metallic taste sensation and life-threatening complications (estimated to be 1 in 10,000)], benefits (risk stratification, diagnosing coronary artery disease, treatment guidance) and alternatives of a nuclear stress test were discussed in detail with Ms. Graziosi and she agrees to proceed.  Follow-up: Return to clinic as needed based on results of stress test and symptoms.  Nelva Bush, MD 09/21/2021 5:05 PM

## 2021-09-23 ENCOUNTER — Other Ambulatory Visit: Payer: Self-pay

## 2021-09-23 DIAGNOSIS — I7409 Other arterial embolism and thrombosis of abdominal aorta: Secondary | ICD-10-CM

## 2021-09-26 NOTE — Pre-Procedure Instructions (Signed)
Surgical Instructions    Your procedure is scheduled on Wednesday 10/05/21.   Report to [facility] Main Entrance "A" at 05:30 A.M., then check in with the Admitting office.  Call this number if you have problems the morning of surgery:  [phone]   If you have any questions prior to your surgery date call [phone]: Open Monday-Friday 8am-4pm    Remember:  Do not eat or drink after midnight the night before your surgery    Take these medicines the morning of surgery with A SIP OF WATER   atorvastatin (LIPITOR)   buPROPion (WELLBUTRIN XL)   Please follow your surgeon's instructions regarding Aspirin. If you have not received instructions, please contact your surgeon's office for instructions.   As of today, STOP taking diclofenac Sodium (VOLTAREN), Aleve, Naproxen, Ibuprofen, Motrin, Advil, Goody's, BC's, all herbal medications, fish oil, and all vitamins.     After your COVID test   You are not required to quarantine however you are required to wear a well-fitting mask when you are out and around people not in your household.  If your mask becomes wet or soiled, replace with a new one.  Wash your hands often with soap and water for 20 seconds or clean your hands with an alcohol-based hand sanitizer that contains at least 60% alcohol.  Do not share personal items.  Notify your provider: if you are in close contact with someone who has COVID  or if you develop a fever of 100.4 or greater, sneezing, cough, sore throat, shortness of breath or body aches.             Do not wear jewelry or makeup Do not wear lotions, powders, perfumes/colognes, or deodorant. Do not shave 48 hours prior to surgery.  Men may shave face and neck. Do not bring valuables to the hospital. DO Not wear nail polish, gel polish, artificial nails, or any other type of covering on natural nails including finger and toenails. If patients have artificial nails, gel coating, etc. that need to be  removed by a nail salon, please have this removed prior to surgery or surgery may need to be canceled/delayed if the surgeon/ anesthesia feels like the patient is unable to be adequately monitored.             [facility] is not responsible for any belongings or valuables.  Do NOT Smoke (Tobacco/Vaping)  24 hours prior to your procedure  If you use a CPAP at night, you may bring your mask for your overnight stay.   Contacts, glasses, hearing aids, dentures or partials may not be worn into surgery, please bring cases for these belongings   For patients admitted to the hospital, discharge time will be determined by your treatment team.   Patients discharged the day of surgery will not be allowed to drive home, and someone needs to stay with them for 24 hours.  NO VISITORS WILL BE ALLOWED IN PRE-OP WHERE PATIENTS ARE PREPPED FOR SURGERY.  ONLY 1 SUPPORT PERSON MAY BE PRESENT IN THE WAITING ROOM WHILE YOU ARE IN SURGERY.  IF YOU ARE TO BE ADMITTED, ONCE YOU ARE IN YOUR ROOM YOU WILL BE ALLOWED TWO (2) VISITORS. 1 (ONE) VISITOR MAY STAY OVERNIGHT BUT MUST ARRIVE TO THE ROOM BY 8pm.  Minor children may have two parents present. Special consideration for safety and communication needs will be reviewed on a case by case basis.  Special instructions:    Oral Hygiene is also important to reduce  your risk of infection.  Remember - BRUSH YOUR TEETH THE MORNING OF SURGERY WITH YOUR REGULAR TOOTHPASTE   [facility]- Preparing For Surgery  Before surgery, you can play an important role. Because skin is not sterile, your skin needs to be as free of germs as possible. You can reduce the number of germs on your skin by washing with CHG (chlorahexidine gluconate) Soap before surgery.  CHG is an antiseptic cleaner which kills germs and bonds with the skin to continue killing germs even after washing.     Please do not use if you have an allergy to CHG or antibacterial soaps. If your skin becomes  reddened/irritated stop using the CHG.  Do not shave (including legs and underarms) for at least 48 hours prior to first CHG shower. It is OK to shave your face.  Please follow these instructions carefully.     Shower the NIGHT BEFORE SURGERY and the MORNING OF SURGERY with CHG Soap.   If you chose to wash your hair, wash your hair first as usual with your normal shampoo. After you shampoo, rinse your hair and body thoroughly to remove the shampoo.  Then [employer] and genitals (private parts) with your normal soap and rinse thoroughly to remove soap.  After that Use CHG Soap as you would any other liquid soap. You can apply CHG directly to the skin and wash gently with a scrungie or a clean washcloth.   Apply the CHG Soap to your body ONLY FROM THE NECK DOWN.  Do not use on open wounds or open sores. Avoid contact with your eyes, ears, mouth and genitals (private parts). Wash Face and genitals (private parts)  with your normal soap.   Wash thoroughly, paying special attention to the area where your surgery will be performed.  Thoroughly rinse your body with warm water from the neck down.  DO NOT shower/wash with your normal soap after using and rinsing off the CHG Soap.  Pat yourself dry with a CLEAN TOWEL.  Wear CLEAN PAJAMAS to bed the night before surgery  Place CLEAN SHEETS on your bed the night before your surgery  DO NOT SLEEP WITH PETS.   Day of Surgery:  Take a shower with CHG soap. Wear Clean/Comfortable clothing the morning of surgery Do not apply any deodorants/lotions.   Remember to brush your teeth WITH YOUR REGULAR TOOTHPASTE.   Please read over the following fact sheets that you were given.

## 2021-09-27 ENCOUNTER — Encounter (HOSPITAL_COMMUNITY)
Admission: RE | Admit: 2021-09-27 | Discharge: 2021-09-27 | Disposition: A | Discharge status: Home / Self Care | Payer: 59 | Source: Ambulatory Visit | Attending: Vascular Surgery | Admitting: Vascular Surgery

## 2021-09-27 ENCOUNTER — Encounter (HOSPITAL_COMMUNITY): Payer: Self-pay

## 2021-09-27 ENCOUNTER — Ambulatory Visit (HOSPITAL_COMMUNITY)
Admission: RE | Admit: 2021-09-27 | Discharge: 2021-09-27 | Disposition: A | Discharge status: Home / Self Care | Payer: 59 | Source: Ambulatory Visit | Attending: Vascular Surgery | Admitting: Vascular Surgery

## 2021-09-27 ENCOUNTER — Other Ambulatory Visit: Payer: Self-pay

## 2021-09-27 VITALS — BP 141/54 | HR 55 | Temp 97.8°F | Resp 17 | Ht 66.0 in | Wt 106.5 lb

## 2021-09-27 DIAGNOSIS — Z01818 Encounter for other preprocedural examination: Secondary | ICD-10-CM | POA: Diagnosis present

## 2021-09-27 DIAGNOSIS — I7409 Other arterial embolism and thrombosis of abdominal aorta: Secondary | ICD-10-CM | POA: Diagnosis present

## 2021-09-27 HISTORY — DX: Headache, unspecified: R51.9

## 2021-09-27 HISTORY — DX: Unspecified osteoarthritis, unspecified site: M19.90

## 2021-09-27 LAB — COMPREHENSIVE METABOLIC PANEL
ALT: 24 U/L (ref 0–44)
AST: 23 U/L (ref 15–41)
Albumin: 3.8 g/dL (ref 3.5–5.0)
Alkaline Phosphatase: 78 U/L (ref 38–126)
Anion gap: 10 (ref 5–15)
BUN: 9 mg/dL (ref 6–20)
CO2: 25 mmol/L (ref 22–32)
Calcium: 9.8 mg/dL (ref 8.9–10.3)
Chloride: 101 mmol/L (ref 98–111)
Creatinine, Ser: 0.9 mg/dL (ref 0.44–1.00)
GFR, Estimated: >60 mL/min (ref >60)
Glucose, Bld: 114 mg/dL — ABNORMAL HIGH (ref 70–99)
Potassium: 4.3 mmol/L (ref 3.5–5.1)
Sodium: 136 mmol/L (ref 135–145)
Total Bilirubin: 1 mg/dL (ref 0.3–1.2)
Total Protein: 7 g/dL (ref 6.5–8.1)

## 2021-09-27 LAB — CBC
HCT: 42.5 % (ref 36.0–46.0)
Hemoglobin: 13.8 g/dL (ref 12.0–15.0)
MCH: 32.8 pg (ref 26.0–34.0)
MCHC: 32.5 g/dL (ref 30.0–36.0)
MCV: 101 fL — ABNORMAL HIGH (ref 80.0–100.0)
Platelets: 152 10*3/uL (ref 150–400)
RBC: 4.21 MIL/uL (ref 3.87–5.11)
RDW: 13.6 % (ref 11.5–15.5)
WBC: 9.2 10*3/uL (ref 4.0–10.5)
nRBC: 0 % (ref 0.0–0.2)

## 2021-09-27 LAB — URINALYSIS, ROUTINE W REFLEX MICROSCOPIC
Bacteria, UA: NONE SEEN
Bilirubin Urine: NEGATIVE
Glucose, UA: NEGATIVE mg/dL
Ketones, ur: NEGATIVE mg/dL
Leukocytes,Ua: NEGATIVE
Nitrite: NEGATIVE
Protein, ur: NEGATIVE mg/dL
Specific Gravity, Urine: 1.01 (ref 1.005–1.030)
pH: 5 (ref 5.0–8.0)

## 2021-09-27 LAB — TYPE AND SCREEN
ABO/RH(D): O POS
Antibody Screen: NEGATIVE

## 2021-09-27 LAB — PROTIME-INR
INR: 0.9 (ref 0.8–1.2)
Prothrombin Time: 12.3 seconds (ref 11.4–15.2)

## 2021-09-27 LAB — SURGICAL PCR SCREEN
MRSA, PCR: NEGATIVE
Staphylococcus aureus: NEGATIVE

## 2021-09-27 LAB — APTT: aPTT: 35 seconds (ref 24–36)

## 2021-09-27 NOTE — Progress Notes (Signed)
PCP - [name] Cardiologist - Dr. Harrell Gave End  PPM/ICD - n/a Device Orders - n/a Rep Notified - n/a  Chest x-ray - n/a EKG - 09/21/21 Stress Test - Scheduled for tomorrow 09/28/21 ECHO - denies Cardiac Cath - denies  Sleep Study - denies CPAP - n/a  Fasting Blood Sugar - n/a Checks Blood Sugar _____ times a day- n/a  Blood Thinner Instructions: n/a Aspirin Instructions: Please follow your surgeon's instructions regarding Aspirin. Patient states she was instructed by her surgeon to continue taking Aspirin.   ERAS Protcol - No PRE-SURGERY Ensure or G2- n/a  COVID TEST- Scheduled for Monday 10/03/21 at 9:30 am. Patient is aware of date, time, and location and verbalized understanding.    Anesthesia review: Yes.   Patient denies shortness of breath, fever, cough and chest pain at PAT appointment   All instructions explained to the patient, with a verbal understanding of the material. Patient agrees to go over the instructions while at home for a better understanding. The opportunity to ask questions was provided.

## 2021-09-28 ENCOUNTER — Ambulatory Visit
Admission: RE | Admit: 2021-09-28 | Discharge: 2021-09-28 | Disposition: A | Discharge status: Home / Self Care | Payer: 59 | Source: Ambulatory Visit | Attending: Internal Medicine | Admitting: Internal Medicine

## 2021-09-28 DIAGNOSIS — Z01810 Encounter for preprocedural cardiovascular examination: Secondary | ICD-10-CM | POA: Diagnosis not present

## 2021-09-28 DIAGNOSIS — Z01818 Encounter for other preprocedural examination: Secondary | ICD-10-CM | POA: Diagnosis present

## 2021-09-28 DIAGNOSIS — R0789 Other chest pain: Secondary | ICD-10-CM | POA: Diagnosis not present

## 2021-09-28 LAB — NM MYOCAR MULTI W/SPECT W/WALL MOTION / EF
Base ST Depression (mm): 0 mm
LV dias vol: 28 mL (ref 46–106)
LV sys vol: 9 mL
Nuc Stress EF: 68 %
Peak HR: 118 {beats}/min
Rest HR: 53 {beats}/min
SDS: 0
SRS: 2
SSS: 0
ST Depression (mm): 0.5 mm
TID: 1.29

## 2021-09-28 MED ORDER — TECHNETIUM TC 99M TETROFOSMIN IV KIT
10.0000 | PACK | Freq: Once | INTRAVENOUS | Status: AC | PRN
  Administered 2021-09-28: 10.95 via INTRAVENOUS

## 2021-09-28 MED ORDER — REGADENOSON 0.4 MG/5ML IV SOLN
0.4000 mg | Freq: Once | INTRAVENOUS | Status: AC
  Administered 2021-09-28: 0.4 mg via INTRAVENOUS

## 2021-09-28 MED ORDER — TECHNETIUM TC 99M TETROFOSMIN IV KIT
30.0000 | PACK | Freq: Once | INTRAVENOUS | Status: AC
  Administered 2021-09-28: 30.89 via INTRAVENOUS

## 2021-09-28 MED ORDER — REGADENOSON 0.4 MG/5ML IV SOLN: 0.4000 mg | Freq: Once | INTRAVENOUS | Status: DC

## 2021-10-03 ENCOUNTER — Other Ambulatory Visit (HOSPITAL_COMMUNITY)
Admission: RE | Admit: 2021-10-03 | Discharge: 2021-10-03 | Disposition: A | Discharge status: Home / Self Care | Payer: 59 | Source: Ambulatory Visit | Attending: Vascular Surgery | Admitting: Vascular Surgery

## 2021-10-03 DIAGNOSIS — Z20822 Contact with and (suspected) exposure to covid-19: Secondary | ICD-10-CM | POA: Insufficient documentation

## 2021-10-03 DIAGNOSIS — Z01812 Encounter for preprocedural laboratory examination: Secondary | ICD-10-CM | POA: Insufficient documentation

## 2021-10-03 DIAGNOSIS — Z01818 Encounter for other preprocedural examination: Secondary | ICD-10-CM

## 2021-10-03 LAB — SARS CORONAVIRUS 2 (TAT 6-24 HRS): SARS Coronavirus 2: NEGATIVE

## 2021-10-03 NOTE — Progress Notes (Signed)
Anesthesia Chart Review:  Patient was seen by cardiologist Dr. Harrell Gave and on 09/21/2021 for preop evaluation in the setting of aortoiliac occlusive disease and lifestyle limiting claudication in both legs at the request of Dr. [name].  Per note, "Atypical chest pain and preoperative cardiovascular evaluation: Melissa Chaney denies a history of heart disease but has known PAD as well as history of tobacco use (ongoing but trying to quit).  Given her atypical chest pain and inability to achieve 4 METS of activity due to claudication, I have recommended that we obtain a pharmacologic myocardial perfusion stress test in anticipation of her undergoing high risk surgery (aortobifemoral bypass).  As long as there are no high risk features on her stress test, I think it would be reasonable for her to proceed with surgery as planned."  Stress test was done 09/28/2021 and was low risk, nonischemic.  Dr. Saunders Revel commented on result stating, "Low risk study without evidence of ischemia or scar.  LV systolic function is normal.  I think it is reasonable for Melissa Chaney to proceed with aortobifemoral bypass without further cardiac testing or intervention."  Preop labs reviewed, unremarkable.  Nuclear stress 09/28/2021:   The study is normal. The study is low risk.   0.5 mm of horizontal ST depression (II, III, aVF, V5 and V6) was noted.   LV perfusion is normal. There is no evidence of ischemia. There is no evidence of infarction.   Left ventricular function is normal. End diastolic cavity size is normal. End systolic cavity size is normal.   CT attenuation images showed no evidence of aortic or coronary calcifications.   Carotid duplex 09/27/2021: Summary:  Right Carotid: Velocities in the right ICA are consistent with a 1-39% stenosis.  Left Carotid: Velocities in the left ICA are consistent with a 1-39% stenosis.  Vertebrals:  Bilateral vertebral arteries demonstrate antegrade flow.  Subclavians: Normal flow  hemodynamics were seen in bilateral subclavian arteries.     Wynonia Musty [facility] Short Stay Center/Anesthesiology Phone [phone] 10/03/2021 1:44 PM

## 2021-10-03 NOTE — Anesthesia Preprocedure Evaluation (Addendum)
Anesthesia Evaluation  Patient identified by MRN, date of birth, ID band Patient awake    Airway Mallampati: II  TM Distance: >3 FB     Dental   Pulmonary Current Smoker and Patient abstained from smoking.,    breath sounds clear to auscultation       Cardiovascular + Peripheral Vascular Disease   Rhythm:Regular Rate:Normal     Neuro/Psych    GI/Hepatic negative GI ROS, Neg liver ROS,   Endo/Other    Renal/GU negative Renal ROS     Musculoskeletal   Abdominal   Peds  Hematology   Anesthesia Other Findings   Reproductive/Obstetrics                            Anesthesia Physical Anesthesia Plan  ASA: 3  Anesthesia Plan: General   Post-op Pain Management:    Induction: Intravenous  PONV Risk Score and Plan: Ondansetron  Airway Management Planned: Oral ETT  Additional Equipment:   Intra-op Plan:   Post-operative Plan: Possible Post-op intubation/ventilation  Informed Consent: I have reviewed the patients History and Physical, chart, labs and discussed the procedure including the risks, benefits and alternatives for the proposed anesthesia with the patient or authorized representative who has indicated his/her understanding and acceptance.     Dental advisory given  Plan Discussed with:   Anesthesia Plan Comments: (PAT note by Karoline Caldwell, PA-C: Patient was seen by cardiologist Dr. Harrell Gave and on 09/21/2021 for preop evaluation in the setting of aortoiliac occlusive disease and lifestyle limiting claudication in both legs at the request of Dr. [name].  Per note, "Atypical chest pain and preoperative cardiovascular evaluation: Ms. Gonet denies a history of heart disease but has known PAD as well as history of tobacco use (ongoing but trying to quit). Given her atypical chest pain and inability to achieve 4 METS of activity due to claudication, I have recommended that we obtain a  pharmacologic myocardial perfusion stress test in anticipation of her undergoing high risk surgery (aortobifemoral bypass). As long as there are no high risk features on her stress test, I think it would be reasonable for her to proceed with surgery as planned."  Stress test was done 09/28/2021 and was low risk, nonischemic.  Dr. Saunders Revel commented on result stating, "Low risk study without evidence of ischemia or scar. LV systolic function is normal. I think it is reasonable for Ms. Amedee to proceed with aortobifemoral bypass without further cardiac testing or intervention."  Preop labs reviewed, unremarkable.  Nuclear stress 09/28/2021: . The study is normal. The study is low risk. Marland Kitchen 0.5 mm of horizontal ST depression (II, III, aVF, V5 and V6) was noted. . LV perfusion is normal. There is no evidence of ischemia. There is no evidence of infarction. . Left ventricular function is normal. End diastolic cavity size is normal. End systolic cavity size is normal. . CT attenuation images showed no evidence of aortic or coronary calcifications.  Carotid duplex 09/27/2021: Summary:  Right Carotid: Velocities in the right ICA are consistent with a 1-39% stenosis.  Left Carotid: Velocities in the left ICA are consistent with a 1-39% stenosis.  Vertebrals: Bilateral vertebral arteries demonstrate antegrade flow.  Subclavians: Normal flow hemodynamics were seen in bilateral subclavian arteries.   )       Anesthesia Quick Evaluation

## 2021-10-05 ENCOUNTER — Inpatient Hospital Stay (HOSPITAL_COMMUNITY): Payer: 59 | Admitting: Anesthesiology

## 2021-10-05 ENCOUNTER — Inpatient Hospital Stay (HOSPITAL_COMMUNITY): Payer: 59 | Admitting: Vascular Surgery

## 2021-10-05 ENCOUNTER — Encounter (HOSPITAL_COMMUNITY)
Admission: RE | Disposition: A | Discharge status: Home / Self Care | Payer: Self-pay | Source: Home / Self Care | Attending: Vascular Surgery

## 2021-10-05 ENCOUNTER — Inpatient Hospital Stay (HOSPITAL_COMMUNITY): Payer: 59

## 2021-10-05 ENCOUNTER — Encounter (HOSPITAL_COMMUNITY): Payer: Self-pay | Admitting: Vascular Surgery

## 2021-10-05 ENCOUNTER — Inpatient Hospital Stay (HOSPITAL_COMMUNITY)
Admission: RE | Admit: 2021-10-05 | Discharge: 2021-10-10 | DRG: 271 | Disposition: A | Discharge status: Home Health | Payer: 59 | Attending: Vascular Surgery | Admitting: Vascular Surgery

## 2021-10-05 ENCOUNTER — Other Ambulatory Visit: Payer: Self-pay

## 2021-10-05 DIAGNOSIS — Z885 Allergy status to narcotic agent status: Secondary | ICD-10-CM

## 2021-10-05 DIAGNOSIS — Z83438 Family history of other disorder of lipoprotein metabolism and other lipidemia: Secondary | ICD-10-CM

## 2021-10-05 DIAGNOSIS — E1151 Type 2 diabetes mellitus with diabetic peripheral angiopathy without gangrene: Principal | ICD-10-CM | POA: Diagnosis present

## 2021-10-05 DIAGNOSIS — Z95828 Presence of other vascular implants and grafts: Secondary | ICD-10-CM

## 2021-10-05 DIAGNOSIS — K5090 Crohn's disease, unspecified, without complications: Secondary | ICD-10-CM | POA: Diagnosis present

## 2021-10-05 DIAGNOSIS — I7409 Other arterial embolism and thrombosis of abdominal aorta: Secondary | ICD-10-CM | POA: Diagnosis present

## 2021-10-05 DIAGNOSIS — Z833 Family history of diabetes mellitus: Secondary | ICD-10-CM

## 2021-10-05 DIAGNOSIS — Z20822 Contact with and (suspected) exposure to covid-19: Secondary | ICD-10-CM | POA: Diagnosis present

## 2021-10-05 DIAGNOSIS — K660 Peritoneal adhesions (postprocedural) (postinfection): Secondary | ICD-10-CM | POA: Diagnosis present

## 2021-10-05 DIAGNOSIS — M1990 Unspecified osteoarthritis, unspecified site: Secondary | ICD-10-CM | POA: Diagnosis present

## 2021-10-05 DIAGNOSIS — J9811 Atelectasis: Secondary | ICD-10-CM | POA: Diagnosis not present

## 2021-10-05 DIAGNOSIS — Z8249 Family history of ischemic heart disease and other diseases of the circulatory system: Secondary | ICD-10-CM

## 2021-10-05 DIAGNOSIS — F17210 Nicotine dependence, cigarettes, uncomplicated: Secondary | ICD-10-CM | POA: Diagnosis present

## 2021-10-05 DIAGNOSIS — Z8041 Family history of malignant neoplasm of ovary: Secondary | ICD-10-CM | POA: Diagnosis not present

## 2021-10-05 DIAGNOSIS — D62 Acute posthemorrhagic anemia: Secondary | ICD-10-CM | POA: Diagnosis not present

## 2021-10-05 DIAGNOSIS — M79672 Pain in left foot: Secondary | ICD-10-CM | POA: Diagnosis present

## 2021-10-05 DIAGNOSIS — I743 Embolism and thrombosis of arteries of the lower extremities: Secondary | ICD-10-CM | POA: Diagnosis not present

## 2021-10-05 DIAGNOSIS — R0600 Dyspnea, unspecified: Secondary | ICD-10-CM

## 2021-10-05 HISTORY — PX: AORTA - BILATERAL FEMORAL ARTERY BYPASS GRAFT: SHX1175

## 2021-10-05 LAB — CBC
HCT: 36.2 % (ref 36.0–46.0)
Hemoglobin: 12.2 g/dL (ref 12.0–15.0)
MCH: 33.2 pg (ref 26.0–34.0)
MCHC: 33.7 g/dL (ref 30.0–36.0)
MCV: 98.6 fL (ref 80.0–100.0)
Platelets: 144 10*3/uL — ABNORMAL LOW (ref 150–400)
RBC: 3.67 MIL/uL — ABNORMAL LOW (ref 3.87–5.11)
RDW: 13.2 % (ref 11.5–15.5)
WBC: 18.1 10*3/uL — ABNORMAL HIGH (ref 4.0–10.5)
nRBC: 0 % (ref 0.0–0.2)

## 2021-10-05 LAB — BASIC METABOLIC PANEL
Anion gap: 8 (ref 5–15)
BUN: 10 mg/dL (ref 6–20)
CO2: 22 mmol/L (ref 22–32)
Calcium: 8.4 mg/dL — ABNORMAL LOW (ref 8.9–10.3)
Chloride: 103 mmol/L (ref 98–111)
Creatinine, Ser: 0.79 mg/dL (ref 0.44–1.00)
GFR, Estimated: >60 mL/min (ref >60)
Glucose, Bld: 189 mg/dL — ABNORMAL HIGH (ref 70–99)
Potassium: 4.1 mmol/L (ref 3.5–5.1)
Sodium: 133 mmol/L — ABNORMAL LOW (ref 135–145)

## 2021-10-05 LAB — MAGNESIUM: Magnesium: 1.5 mg/dL — ABNORMAL LOW (ref 1.7–2.4)

## 2021-10-05 LAB — POCT ACTIVATED CLOTTING TIME
Activated Clotting Time: 239 seconds
Activated Clotting Time: 341 seconds
Activated Clotting Time: 131 seconds

## 2021-10-05 LAB — APTT: aPTT: 28 seconds (ref 24–36)

## 2021-10-05 LAB — PROTIME-INR
INR: 1 (ref 0.8–1.2)
Prothrombin Time: 13.6 seconds (ref 11.4–15.2)

## 2021-10-05 LAB — ABO/RH: ABO/RH(D): O POS

## 2021-10-05 SURGERY — CREATION, BYPASS, ARTERIAL, AORTA TO FEMORAL, BILATERAL, USING GRAFT
Anesthesia: General | Laterality: Bilateral

## 2021-10-05 MED ORDER — DEXAMETHASONE SODIUM PHOSPHATE 10 MG/ML IJ SOLN
INTRAMUSCULAR | Status: AC
  Filled 2021-10-05: qty 1

## 2021-10-05 MED ORDER — SODIUM CHLORIDE 0.9 % IV SOLN: INTRAVENOUS | Status: DC

## 2021-10-05 MED ORDER — FENTANYL CITRATE (PF) 250 MCG/5ML IJ SOLN
INTRAMUSCULAR | Status: AC
  Filled 2021-10-05: qty 5

## 2021-10-05 MED ORDER — LABETALOL HCL 5 MG/ML IV SOLN: 10.0000 mg | INTRAVENOUS | Status: DC | PRN

## 2021-10-05 MED ORDER — KETAMINE HCL 10 MG/ML IJ SOLN
INTRAMUSCULAR | Status: DC | PRN
  Administered 2021-10-05: 20 mg via INTRAVENOUS

## 2021-10-05 MED ORDER — PHENOL 1.4 % MT LIQD: 1.0000 | OROMUCOSAL | Status: DC | PRN

## 2021-10-05 MED ORDER — PROTAMINE SULFATE 10 MG/ML IV SOLN
INTRAVENOUS | Status: AC
  Filled 2021-10-05: qty 5

## 2021-10-05 MED ORDER — HEPARIN SODIUM (PORCINE) 1000 UNIT/ML IJ SOLN
INTRAMUSCULAR | Status: DC | PRN
  Administered 2021-10-05: 2000 [IU] via INTRAVENOUS
  Administered 2021-10-05: 5000 [IU] via INTRAVENOUS

## 2021-10-05 MED ORDER — MORPHINE SULFATE (PF) 2 MG/ML IV SOLN
2.0000 mg | INTRAVENOUS | Status: DC | PRN
  Administered 2021-10-05 – 2021-10-06 (×11): 2 mg via INTRAVENOUS
  Filled 2021-10-05 (×13): qty 1

## 2021-10-05 MED ORDER — CHLORHEXIDINE GLUCONATE CLOTH 2 % EX PADS
6.0000 | MEDICATED_PAD | Freq: Every day | CUTANEOUS | Status: DC
  Administered 2021-10-06 – 2021-10-09 (×4): 6 via TOPICAL

## 2021-10-05 MED ORDER — LACTATED RINGERS IV SOLN: INTRAVENOUS | Status: DC | PRN

## 2021-10-05 MED ORDER — CHLORHEXIDINE GLUCONATE 0.12 % MT SOLN
OROMUCOSAL | Status: AC
  Administered 2021-10-05: 15 mL
  Filled 2021-10-05: qty 15

## 2021-10-05 MED ORDER — PROPOFOL 10 MG/ML IV BOLUS
INTRAVENOUS | Status: DC | PRN
  Administered 2021-10-05: 100 mg via INTRAVENOUS
  Administered 2021-10-05: 50 mg via INTRAVENOUS

## 2021-10-05 MED ORDER — ACETAMINOPHEN 325 MG RE SUPP: 325.0000 mg | RECTAL | Status: DC | PRN

## 2021-10-05 MED ORDER — METOPROLOL TARTRATE 5 MG/5ML IV SOLN
2.0000 mg | INTRAVENOUS | Status: AC | PRN
  Administered 2021-10-06: 3 mg via INTRAVENOUS
  Administered 2021-10-07: 5 mg via INTRAVENOUS
  Filled 2021-10-05 (×2): qty 5

## 2021-10-05 MED ORDER — PHENYLEPHRINE 40 MCG/ML (10ML) SYRINGE FOR IV PUSH (FOR BLOOD PRESSURE SUPPORT)
PREFILLED_SYRINGE | INTRAVENOUS | Status: AC
  Filled 2021-10-05: qty 10

## 2021-10-05 MED ORDER — ALUM & MAG HYDROXIDE-SIMETH 200-200-20 MG/5ML PO SUSP: 15.0000 mL | ORAL | Status: DC | PRN

## 2021-10-05 MED ORDER — SODIUM CHLORIDE 0.9 % IV SOLN: 500.0000 mL | Freq: Once | INTRAVENOUS | Status: DC | PRN

## 2021-10-05 MED ORDER — FENTANYL CITRATE (PF) 100 MCG/2ML IJ SOLN
INTRAMUSCULAR | Status: AC
  Filled 2021-10-05: qty 2

## 2021-10-05 MED ORDER — CEFAZOLIN SODIUM-DEXTROSE 2-4 GM/100ML-% IV SOLN
2.0000 g | Freq: Three times a day (TID) | INTRAVENOUS | Status: AC
  Administered 2021-10-05 (×2): 2 g via INTRAVENOUS
  Filled 2021-10-05 (×2): qty 100

## 2021-10-05 MED ORDER — HEPARIN 6000 UNIT IRRIGATION SOLUTION
Status: AC
  Filled 2021-10-05: qty 500

## 2021-10-05 MED ORDER — CHLORHEXIDINE GLUCONATE CLOTH 2 % EX PADS
6.0000 | MEDICATED_PAD | Freq: Once | CUTANEOUS | Status: AC
  Administered 2021-10-05: 6 via TOPICAL

## 2021-10-05 MED ORDER — HEPARIN 6000 UNIT IRRIGATION SOLUTION
Status: DC | PRN
  Administered 2021-10-05: 1

## 2021-10-05 MED ORDER — AMISULPRIDE (ANTIEMETIC) 5 MG/2ML IV SOLN
5.0000 mg | Freq: Once | INTRAVENOUS | Status: AC
  Administered 2021-10-05: 5 mg via INTRAVENOUS

## 2021-10-05 MED ORDER — LIDOCAINE 2% (20 MG/ML) 5 ML SYRINGE
INTRAMUSCULAR | Status: DC | PRN
  Administered 2021-10-05: 100 mg via INTRAVENOUS

## 2021-10-05 MED ORDER — DEXAMETHASONE SODIUM PHOSPHATE 10 MG/ML IJ SOLN
INTRAMUSCULAR | Status: DC | PRN
  Administered 2021-10-05: 10 mg via INTRAVENOUS

## 2021-10-05 MED ORDER — AMISULPRIDE (ANTIEMETIC) 5 MG/2ML IV SOLN
INTRAVENOUS | Status: AC
  Filled 2021-10-05: qty 2

## 2021-10-05 MED ORDER — DEXMEDETOMIDINE (PRECEDEX) IN NS 20 MCG/5ML (4 MCG/ML) IV SYRINGE
PREFILLED_SYRINGE | INTRAVENOUS | Status: DC | PRN
  Administered 2021-10-05: 4 ug via INTRAVENOUS
  Administered 2021-10-05: 8 ug via INTRAVENOUS
  Administered 2021-10-05 (×2): 4 ug via INTRAVENOUS

## 2021-10-05 MED ORDER — FENTANYL CITRATE (PF) 250 MCG/5ML IJ SOLN
INTRAMUSCULAR | Status: DC | PRN
  Administered 2021-10-05 (×2): 50 ug via INTRAVENOUS
  Administered 2021-10-05: 100 ug via INTRAVENOUS
  Administered 2021-10-05 (×2): 50 ug via INTRAVENOUS
  Administered 2021-10-05: 100 ug via INTRAVENOUS
  Administered 2021-10-05 (×2): 50 ug via INTRAVENOUS

## 2021-10-05 MED ORDER — CLEVIDIPINE BUTYRATE 0.5 MG/ML IV EMUL
INTRAVENOUS | Status: DC | PRN
  Administered 2021-10-05: 2 mg/h via INTRAVENOUS

## 2021-10-05 MED ORDER — ALBUMIN HUMAN 5 % IV SOLN: INTRAVENOUS | Status: DC | PRN

## 2021-10-05 MED ORDER — HEPARIN SODIUM (PORCINE) 1000 UNIT/ML IJ SOLN
INTRAMUSCULAR | Status: AC
  Filled 2021-10-05: qty 10

## 2021-10-05 MED ORDER — LIDOCAINE 2% (20 MG/ML) 5 ML SYRINGE
INTRAMUSCULAR | Status: AC
  Filled 2021-10-05: qty 5

## 2021-10-05 MED ORDER — PROTAMINE SULFATE 10 MG/ML IV SOLN
INTRAVENOUS | Status: AC
  Filled 2021-10-05: qty 50

## 2021-10-05 MED ORDER — HYDRALAZINE HCL 20 MG/ML IJ SOLN: 5.0000 mg | INTRAMUSCULAR | Status: DC | PRN

## 2021-10-05 MED ORDER — PROTAMINE SULFATE 10 MG/ML IV SOLN
INTRAVENOUS | Status: DC | PRN
  Administered 2021-10-05: 50 mg via INTRAVENOUS

## 2021-10-05 MED ORDER — POTASSIUM CHLORIDE CRYS ER 20 MEQ PO TBCR: 20.0000 meq | EXTENDED_RELEASE_TABLET | Freq: Every day | ORAL | Status: DC | PRN

## 2021-10-05 MED ORDER — CEFAZOLIN SODIUM-DEXTROSE 2-4 GM/100ML-% IV SOLN
INTRAVENOUS | Status: AC
  Filled 2021-10-05: qty 100

## 2021-10-05 MED ORDER — SUGAMMADEX SODIUM 200 MG/2ML IV SOLN
INTRAVENOUS | Status: DC | PRN
  Administered 2021-10-05: 200 mg via INTRAVENOUS

## 2021-10-05 MED ORDER — PANTOPRAZOLE SODIUM 40 MG PO TBEC
40.0000 mg | DELAYED_RELEASE_TABLET | Freq: Every day | ORAL | Status: DC
  Administered 2021-10-06 – 2021-10-10 (×5): 40 mg via ORAL
  Filled 2021-10-05 (×6): qty 1

## 2021-10-05 MED ORDER — FENTANYL CITRATE (PF) 100 MCG/2ML IJ SOLN
25.0000 ug | INTRAMUSCULAR | Status: DC | PRN
  Administered 2021-10-05 (×3): 50 ug via INTRAVENOUS

## 2021-10-05 MED ORDER — ROCURONIUM BROMIDE 10 MG/ML (PF) SYRINGE
PREFILLED_SYRINGE | INTRAVENOUS | Status: AC
  Filled 2021-10-05: qty 20

## 2021-10-05 MED ORDER — MIDAZOLAM HCL 2 MG/2ML IJ SOLN
INTRAMUSCULAR | Status: AC
  Filled 2021-10-05: qty 2

## 2021-10-05 MED ORDER — KETAMINE HCL 50 MG/5ML IJ SOSY
PREFILLED_SYRINGE | INTRAMUSCULAR | Status: AC
  Filled 2021-10-05: qty 5

## 2021-10-05 MED ORDER — MAGNESIUM SULFATE 2 GM/50ML IV SOLN
2.0000 g | Freq: Every day | INTRAVENOUS | Status: AC | PRN
  Administered 2021-10-05: 2 g via INTRAVENOUS
  Filled 2021-10-05: qty 50

## 2021-10-05 MED ORDER — DOCUSATE SODIUM 100 MG PO CAPS
100.0000 mg | ORAL_CAPSULE | Freq: Every day | ORAL | Status: DC
  Administered 2021-10-06 – 2021-10-09 (×4): 100 mg via ORAL
  Filled 2021-10-05 (×5): qty 1

## 2021-10-05 MED ORDER — GUAIFENESIN-DM 100-10 MG/5ML PO SYRP: 15.0000 mL | ORAL_SOLUTION | ORAL | Status: DC | PRN

## 2021-10-05 MED ORDER — ACETAMINOPHEN 325 MG PO TABS: 325.0000 mg | ORAL_TABLET | ORAL | Status: DC | PRN

## 2021-10-05 MED ORDER — CHLORHEXIDINE GLUCONATE CLOTH 2 % EX PADS: 6.0000 | MEDICATED_PAD | Freq: Once | CUTANEOUS | Status: DC

## 2021-10-05 MED ORDER — CEFAZOLIN SODIUM-DEXTROSE 2-4 GM/100ML-% IV SOLN
2.0000 g | INTRAVENOUS | Status: AC
  Administered 2021-10-05: 2 g via INTRAVENOUS

## 2021-10-05 MED ORDER — ONDANSETRON HCL 4 MG/2ML IJ SOLN
INTRAMUSCULAR | Status: AC
  Filled 2021-10-05: qty 2

## 2021-10-05 MED ORDER — 0.9 % SODIUM CHLORIDE (POUR BTL) OPTIME
TOPICAL | Status: DC | PRN
  Administered 2021-10-05: 3000 mL

## 2021-10-05 MED ORDER — ONDANSETRON HCL 4 MG/2ML IJ SOLN
4.0000 mg | Freq: Four times a day (QID) | INTRAMUSCULAR | Status: DC | PRN
  Administered 2021-10-06: 4 mg via INTRAVENOUS
  Filled 2021-10-05: qty 2

## 2021-10-05 MED ORDER — PROPOFOL 10 MG/ML IV BOLUS
INTRAVENOUS | Status: AC
  Filled 2021-10-05: qty 20

## 2021-10-05 MED ORDER — PHENYLEPHRINE HCL-NACL 20-0.9 MG/250ML-% IV SOLN
INTRAVENOUS | Status: DC | PRN
  Administered 2021-10-05: 30 ug/min via INTRAVENOUS

## 2021-10-05 MED ORDER — ONDANSETRON HCL 4 MG/2ML IJ SOLN
INTRAMUSCULAR | Status: DC | PRN
  Administered 2021-10-05: 4 mg via INTRAVENOUS

## 2021-10-05 MED ORDER — MIDAZOLAM HCL 2 MG/2ML IJ SOLN
INTRAMUSCULAR | Status: DC | PRN
  Administered 2021-10-05: 2 mg via INTRAVENOUS

## 2021-10-05 MED ORDER — ROCURONIUM BROMIDE 10 MG/ML (PF) SYRINGE
PREFILLED_SYRINGE | INTRAVENOUS | Status: DC | PRN
  Administered 2021-10-05: 30 mg via INTRAVENOUS
  Administered 2021-10-05: 10 mg via INTRAVENOUS
  Administered 2021-10-05: 30 mg via INTRAVENOUS
  Administered 2021-10-05: 60 mg via INTRAVENOUS

## 2021-10-05 SURGICAL SUPPLY — 77 items
ADH SKN CLS APL DERMABOND .7 (GAUZE/BANDAGES/DRESSINGS) ×3
APL SKNCLS STERI-STRIP NONHPOA (GAUZE/BANDAGES/DRESSINGS) ×1
BAG COUNTER SPONGE SURGICOUNT (BAG) ×3 IMPLANT
BAG ISL DRAPE 18X18 STRL (DRAPES)
BAG ISOLATION DRAPE 18X18 (DRAPES) IMPLANT
BENZOIN TINCTURE PRP APPL 2/3 (GAUZE/BANDAGES/DRESSINGS) ×3 IMPLANT
CANISTER SUCT 3000ML PPV (MISCELLANEOUS) ×3 IMPLANT
CANNULA VESSEL 3MM 2 BLNT TIP (CANNULA) ×6 IMPLANT
CATH ROBINSON RED A/P 18FR (CATHETERS) ×6 IMPLANT
CHLORAPREP W/TINT 26 (MISCELLANEOUS) ×6 IMPLANT
CLIP VESOCCLUDE MED 24/CT (CLIP) ×3 IMPLANT
CLIP VESOCCLUDE SM WIDE 24/CT (CLIP) ×3 IMPLANT
COVER PROBE W GEL 5X96 (DRAPES) ×3 IMPLANT
DERMABOND ADVANCED (GAUZE/BANDAGES/DRESSINGS) ×3
DERMABOND ADVANCED .7 DNX12 (GAUZE/BANDAGES/DRESSINGS) ×6 IMPLANT
DRAIN CHANNEL 15F RND FF W/TCR (WOUND CARE) IMPLANT
DRAPE ISOLATION BAG 18X18 (DRAPES)
ELECT BLADE 4.0 EZ CLEAN MEGAD (MISCELLANEOUS) ×3
ELECT BLADE 6.5 EXT (BLADE) IMPLANT
ELECT REM PT RETURN 9FT ADLT (ELECTROSURGICAL) ×3
ELECTRODE BLDE 4.0 EZ CLN MEGD (MISCELLANEOUS) ×2 IMPLANT
ELECTRODE REM PT RTRN 9FT ADLT (ELECTROSURGICAL) ×2 IMPLANT
EVACUATOR SILICONE 100CC (DRAIN) IMPLANT
FELT TEFLON 1X6 (MISCELLANEOUS) IMPLANT
GAUZE 4X4 16PLY ~~LOC~~+RFID DBL (SPONGE) ×3 IMPLANT
GAUZE SPONGE 4X4 12PLY STRL (GAUZE/BANDAGES/DRESSINGS) ×3 IMPLANT
GLOVE SURG POLYISO LF SZ8 (GLOVE) ×3 IMPLANT
GOWN STRL REUS W/ TWL LRG LVL3 (GOWN DISPOSABLE) ×4 IMPLANT
GOWN STRL REUS W/ TWL XL LVL3 (GOWN DISPOSABLE) ×2 IMPLANT
GOWN STRL REUS W/TWL LRG LVL3 (GOWN DISPOSABLE) ×6
GOWN STRL REUS W/TWL XL LVL3 (GOWN DISPOSABLE) ×3
GRAFT HEMASHIELD 12X7MM (Vascular Products) ×3 IMPLANT
INSERT FOGARTY 61MM (MISCELLANEOUS) ×6 IMPLANT
INSERT FOGARTY SM (MISCELLANEOUS) ×12 IMPLANT
KIT BASIN OR (CUSTOM PROCEDURE TRAY) ×3 IMPLANT
KIT TURNOVER KIT B (KITS) ×3 IMPLANT
LOOP VESSEL MAXI BLUE (MISCELLANEOUS) ×12 IMPLANT
LOOP VESSEL MINI RED (MISCELLANEOUS) ×15 IMPLANT
NS IRRIG 1000ML POUR BTL (IV SOLUTION) ×9 IMPLANT
PACK AORTA (CUSTOM PROCEDURE TRAY) ×3 IMPLANT
PACK PERIPHERAL VASCULAR (CUSTOM PROCEDURE TRAY) IMPLANT
PAD ARMBOARD 7.5X6 YLW CONV (MISCELLANEOUS) ×6 IMPLANT
PENCIL SMOKE EVACUATOR (MISCELLANEOUS) ×3 IMPLANT
SPONGE T-LAP 18X18 ~~LOC~~+RFID (SPONGE) ×9 IMPLANT
STRIP CLOSURE SKIN 1/2X4 (GAUZE/BANDAGES/DRESSINGS) ×3 IMPLANT
SUT ETHILON 3 0 PS 1 (SUTURE) IMPLANT
SUT MNCRL AB 4-0 PS2 18 (SUTURE) ×12 IMPLANT
SUT PDS AB 1 TP1 54 (SUTURE) ×6 IMPLANT
SUT PROLENE 3 0 SH1 36 (SUTURE) ×12 IMPLANT
SUT PROLENE 4 0 RB 1 (SUTURE)
SUT PROLENE 4-0 RB1 .5 CRCL 36 (SUTURE) IMPLANT
SUT PROLENE 5 0 C 1 24 (SUTURE) ×9 IMPLANT
SUT PROLENE 5 0 C 1 36 (SUTURE) IMPLANT
SUT PROLENE 6 0 BV (SUTURE) ×6 IMPLANT
SUT SILK 2 0 (SUTURE) ×3
SUT SILK 2 0 TIES 17X18 (SUTURE) ×3
SUT SILK 2 0SH CR/8 30 (SUTURE) ×3 IMPLANT
SUT SILK 2-0 18XBRD TIE 12 (SUTURE) ×2 IMPLANT
SUT SILK 2-0 18XBRD TIE BLK (SUTURE) ×2 IMPLANT
SUT SILK 3 0 (SUTURE) ×6
SUT SILK 3 0 TIES 17X18 (SUTURE) ×3
SUT SILK 3-0 18XBRD TIE 12 (SUTURE) ×4 IMPLANT
SUT SILK 3-0 18XBRD TIE BLK (SUTURE) ×2 IMPLANT
SUT VIC AB 0 CT1 18XCR BRD 8 (SUTURE) ×4 IMPLANT
SUT VIC AB 0 CT1 8-18 (SUTURE) ×6
SUT VIC AB 2-0 CT1 27 (SUTURE) ×9
SUT VIC AB 2-0 CT1 TAPERPNT 27 (SUTURE) ×6 IMPLANT
SUT VIC AB 3-0 SH 27 (SUTURE) ×15
SUT VIC AB 3-0 SH 27X BRD (SUTURE) ×10 IMPLANT
SYR BULB IRRIG 60ML STRL (SYRINGE) IMPLANT
TAPE UMBILICAL 1/8X30 (MISCELLANEOUS) ×6 IMPLANT
TOWEL GREEN STERILE (TOWEL DISPOSABLE) ×6 IMPLANT
TOWEL GREEN STERILE FF (TOWEL DISPOSABLE) ×3 IMPLANT
TOWEL ~~LOC~~+RFID 17X26 BLUE (SPONGE) ×6 IMPLANT
TRAY FOLEY MTR SLVR 16FR STAT (SET/KITS/TRAYS/PACK) ×3 IMPLANT
UNDERPAD 30X36 HEAVY ABSORB (UNDERPADS AND DIAPERS) ×3 IMPLANT
WATER STERILE IRR 1000ML POUR (IV SOLUTION) ×6 IMPLANT

## 2021-10-05 NOTE — Anesthesia Procedure Notes (Addendum)
  Central Venous Catheter Insertion Performed by: anesthesiologist Start/End11/30/2022 7:05 AM, 10/05/2021 7:20 AM Patient location: Pre-op. Preanesthetic checklist: patient identified, IV checked, site marked, risks and benefits discussed, surgical consent, monitors and equipment checked, pre-op evaluation, timeout performed and anesthesia consent Position: reverse Trendelenburg Lidocaine 1% used for infiltration and patient sedated Hand hygiene performed  and maximum sterile barriers used  Catheter size: 8.5 Fr Central line was placed.Sheath introducer Procedure performed using ultrasound guided technique. Ultrasound Notes:anatomy identified, needle tip was noted to be adjacent to the nerve/plexus identified, no ultrasound evidence of intravascular and/or intraneural injection and image(s) printed for medical record Attempts: 1 Following insertion, line sutured and dressing applied. Post procedure assessment: blood return through all ports, free fluid flow and no air  Patient tolerated the procedure well with no immediate complications. Additional procedure comments: Triple lumen shealth inserted without difficulty through introducer.Marland Kitchen

## 2021-10-05 NOTE — Op Note (Signed)
DATE OF SERVICE: 10/05/2021  PATIENT:  Melissa Chaney  55 y.o. female  PRE-OPERATIVE DIAGNOSIS: Aortoiliac occlusive disease causing ischemic rest pain  POST-OPERATIVE DIAGNOSIS:  Same  PROCEDURE:   1) lysis of adhesions (30 minutes) 2) aortobifemoral bypass with 12 x 7 mm bifurcated dacron graft  SURGEON:  Surgeon(s) and Role:    * Cherre Robins, MD      ASSISTANT: Marty Heck, MD   An assistant was required to facilitate exposure and expedite the case.  ANESTHESIA:   general  EBL: 56m  Urine output: 222m Resuscitation: 220076mrystalloid, 250m7mbumin, 170mL29ml saver  BLOOD ADMINISTERED: 170 CC CELLSAVER  DRAINS: none   LOCAL MEDICATIONS USED:  NONE  SPECIMEN:  none  COUNTS: confirmed correct.  TOURNIQUET:  none  PATIENT DISPOSITION:  PACU - hemodynamically stable.   Delay start of Pharmacological VTE agent (>24hrs) due to surgical blood loss or risk of bleeding: no  INDICATION FOR PROCEDURE: Melissa Chaney 55 y.54 female with aortoiliac occlusive disease causing ischemic rest pain.  Preoperative cardiac risk stratification revealed low risk for open aortic surgery.  Patient was offered aortobifemoral bypass grafting because of her young age, and limited comorbidities.  We discussed risks, benefits, and alternatives to intervention.  We specifically discussed risk of death, stroke, pneumonia, respiratory failure, renal dysfunction, MI, and even death.  The patient is understanding and wished to proceed.   OPERATIVE FINDINGS: Massive lysis of adhesions required to expose the retroperitoneum safely.  The immediate infrarenal aorta was fairly healthy and free of disease.  We exposed the left renal artery which we knew to be the lower artery and circumferentially controlled the aorta here.  The femoral arteries were exposed through transverse incisions.  Retroperitoneal tunnels were created in standard fashion using blunt digital dissection.   Proximal anastomosis was sewn end-to-end.  Distal anastomoses were sewn end to side.  Brisk Doppler flow was heard in the feet at completion.    DESCRIPTION OF PROCEDURE: After identification of the patient in the pre-operative holding area, the patient was transferred to the operating room. The patient was positioned supine on the operating room table. Anesthesia was induced. The chest, abdomen, groins, and thighs were prepped and draped in standard fashion. A surgical pause was performed confirming correct patient, procedure, and operative location.  Transverse incision was made over the right groin to expose the common femoral artery and its bifurcation.  Incision was carried down through subtenons tissue using Bovie electrocautery until the femoral sheath was encountered.  This was incised sharply.  The femoral arteries were identified, and skeletonized.  Silastic Vesseloops were placed around all femoral artery branches, including circumflex iliac branches.  A circumflex iliac vein was identified crossing over the external iliac artery this was ligated proximally distally and divided to avoid bleeding later.  Transverse incision was made over the left groin to expose the common femoral artery and its bifurcation.  Incision was carried down through subtenons tissue using Bovie electrocautery until the femoral sheath was encountered.  This was incised sharply.  The femoral arteries were identified, and skeletonized.  Silastic Vesseloops were placed around all femoral artery branches, including circumflex iliac branches.  A circumflex iliac vein was identified crossing over the external iliac artery this was ligated proximally distally and divided to avoid bleeding later.  Laparotomy was made from xiphoid to suprapubic abdomen.  The incision was carried through previous scar about the umbilicus.  The incision was carried out  subtenons tissue until the anterior rectus fascia was identified and opened in the  midline.  Using careful, blunt dissection the remainder of the anterior rectus fascia was divided with Bovie electrocautery.  Adhesions were noted to the umbilicus and were taken down with accommodation of sharp and Bovie dissection ultimately the laparotomy was completed and the abdomen entered.  Further lysis of adhesions continued to disentangle the small bowel from the transverse colon.  This was again done with a combination of blunt, Bovie, and sharp dissection.  Ultimately we were able to expose the retroperitoneum safely.    Both abdominal walls were protected with saline moistened towels.  A Balfour retractor was brought on the field and used to expose the abdominal contents.  The transverse colon was eviscerated and placed cranially to the laparotomy.  The small bowel was eviscerated and placed to the right of the abdomen.  An Omni-Tract retractor was brought on the field and secured into place.  A fence retractor was used to bring the small bowel to the right.  A splanchnic retractor was used to bring the transverse colon cranially.  Dissection was carried down onto the anterior surface of the aorta using Bovie electrocautery.  We then skeletonized the anterior surface of the aorta from its bifurcation to the left renal artery.  Circumferential control was achieved around the immediate infrarenal aorta and a segment of aorta was marked with the Bovie 2 cm distal to this point to use as our point of transection.  Retroperitoneal tunnels were created connecting our femoral artery exposures to the common iliac arteries.  Great care was taken to avoid injuring or trapping the ureter in doing so.  We stayed on the surface of the iliac vessels using blunt digital dissection.  The tunnels were held open with red rubber catheters.  Hawkens and clamp was placed around the aorta proximally.  A Zanger clamp was slotted distally.  The patient was systemically heparinized.  Activated clotting time measurements  were used throughout the case to confirm adequate anticoagulation.  After careful communication with the anesthesia staff we crossclamped the aorta proximally distally.  The aorta was transected with Bovie electrocautery.  Some bleeding was noted from the distal aspect.  2 lumbar arteries were identified and clipped resulting in hemostasis.  The aorta was completely transected.  The distal aorta was then oversewn using a 2 layer closure of 3-0 Prolene.  A 12 x 6 mm dacryon bifurcated graft was brought onto the field and anastomosed end to end to the date aorta.  This was done with continuous running suture.  Immediately prior to completion we communicated with anesthesia that the cross-clamp would be released.  Cross-clamp was slowly released and hemostasis was achieved in the anastomotic suture line.  Two repair sutures were needed.  We then delivered the 2 limbs of the bifurcated graft through the retroperitoneal tunnels using the red rubber catheters.  Fogarty Hydro grip clamps were applied to the individual limbs of the bifurcated graft.    Clamps were applied to the right common femoral artery proximally and distally.  An anterior arteriotomy was made with 11 blade and extended with Potts scissors.  The right limb of the bifurcated graft was then cut to lay without tension or redundancy and spatulated to allow end-to-side anastomosis.  End-to-side anastomosis was then performed using continuous running suture of 5-0 Prolene.  Immediately prior to completion the anastomosis was flushed into the pelvis.  Then clamps were released on the inflow.  Several  beats were allowed to progress.  Then clamps were released on the distal femoral vessels.  Doppler machine was used to interrogate the repair.  Brisk Doppler flow was heard in the profunda and superficial femoral artery.  Clamps were applied to the left common femoral artery proximally and distally.  An anterior arteriotomy was made with 11 blade and  extended with Potts scissors.  The left limb of the bifurcated graft was then cut to lay without tension or redundancy and spatulated to allow end-to-side anastomosis.  End-to-side anastomosis was then performed using continuous running suture of 5-0 Prolene.  Immediately prior to completion the anastomosis was flushed into the pelvis.  Then clamps were released on the inflow.  Several beats were allowed to progress.  Then clamps were released on the distal femoral vessels.  Doppler machine was used to interrogate the repair.  Brisk Doppler flow was heard in the profunda and superficial femoral artery.  Doppler machine was used to interrogate pedal flow.  Brisk Doppler flow was heard bilaterally.  Satisfied we ended our revascularization here.  Heparin was reversed with protamine.  Hemostasis was confirmed in all anastomoses and surgical beds.    The left groin was closed in layers using 2-0 Vicryl, 3-0 Vicryl, 4-0 Monocryl.  The right groin was closed in layers using 2-0 Vicryl, 3-0 Vicryl, 4-0 Monocryl.  The retroperitoneum was closed using a continuous running suture of 3-0 Vicryl.  The midline laparotomy was closed using internal retention sutures of 0 Vicryl, followed by continuous running suture of 0 PDS, followed by 3-0 Vicryl, followed by 4-0 Monocryl.  Dermabond was applied to all incisions.  Upon completion of the case instrument and sharps counts were confirmed correct. The patient was transferred to the PACU in good condition. I was present for all portions of the procedure.  [name]. [name], MD [facility] Phone Number: [phone] 10/05/2021 11:47 AM

## 2021-10-05 NOTE — Anesthesia Postprocedure Evaluation (Signed)
Anesthesia Post Note  Patient: Melissa Chaney  Procedure(s) Performed: AORTOBIFEMORAL BYPASS GRAFT AND LYSIS OF ADHESION (Bilateral) APPLICATION OF CELL SAVER     Patient location during evaluation: PACU Anesthesia Type: General Level of consciousness: awake Pain management: pain level controlled Vital Signs Assessment: post-procedure vital signs reviewed and stable Respiratory status: spontaneous breathing Cardiovascular status: stable Postop Assessment: no apparent nausea or vomiting Anesthetic complications: no   No notable events documented.  Last Vitals:  Vitals:   10/05/21 1415 10/05/21 1429  BP: (!) 121/53 129/62  Pulse: 78 83  Resp: 12 16  Temp: (!) 36.1 C   SpO2: 96% 100%    Last Pain:  Vitals:   10/05/21 1515  TempSrc:   PainSc: 10-Worst pain ever                 Rai Severns

## 2021-10-05 NOTE — Anesthesia Procedure Notes (Signed)
Arterial Line Insertion Start/End11/30/2022 7:00 AM, 10/05/2021 7:20 AM Performed by: CRNA  Patient location: Pre-op. Preanesthetic checklist: patient identified, IV checked, site marked, risks and benefits discussed, surgical consent, monitors and equipment checked, pre-op evaluation, timeout performed and anesthesia consent Lidocaine 1% used for infiltration and patient sedated Right, radial was placed Catheter size: 20 G Hand hygiene performed  and maximum sterile barriers used   Attempts: 2 (one attempt on lt radial) Procedure performed without using ultrasound guided technique. Following insertion, Biopatch and dressing applied. Post procedure assessment: normal  Post procedure complications: local hematoma (lt radial hematoma, soft and reduced). Patient tolerated the procedure well with no immediate complications.

## 2021-10-05 NOTE — Anesthesia Procedure Notes (Signed)
Procedure Name: Intubation Date/Time: 10/05/2021 7:51 AM Performed by: Lorie Phenix, CRNA Pre-anesthesia Checklist: Patient identified, Emergency Drugs available, Suction available and Patient being monitored Patient Re-evaluated:Patient Re-evaluated prior to induction Oxygen Delivery Method: Circle system utilized Preoxygenation: Pre-oxygenation with 100% oxygen Induction Type: IV induction Ventilation: Mask ventilation without difficulty Laryngoscope Size: Mac and 3 Grade View: Grade I Tube type: Oral Tube size: 7.0 mm Number of attempts: 1 Airway Equipment and Method: Stylet and Oral airway Placement Confirmation: ETT inserted through vocal cords under direct vision, positive ETCO2 and breath sounds checked- equal and bilateral Secured at: 22 cm Tube secured with: Tape Dental Injury: Teeth and Oropharynx as per pre-operative assessment

## 2021-10-05 NOTE — Addendum Note (Signed)
Addendum  created 10/05/21 1825 by Belinda Block, MD   Clinical Note Signed, Image imported, Intraprocedure Blocks edited, LDA updated via procedure documentation

## 2021-10-05 NOTE — Transfer of Care (Signed)
Immediate Anesthesia Transfer of Care Note  Patient: Melissa Chaney  Procedure(s) Performed: AORTOBIFEMORAL BYPASS GRAFT AND LYSIS OF ADHESION (Bilateral) APPLICATION OF CELL SAVER  Patient Location: PACU  Anesthesia Type:General  Level of Consciousness: awake and alert   Airway & Oxygen Therapy: Patient Spontanous Breathing and Patient connected to face mask oxygen  Post-op Assessment: Report given to RN and Post -op Vital signs reviewed and stable  Post vital signs: Reviewed and stable  Last Vitals:  Vitals Value Taken Time  BP 123/63 10/05/21 1214  Temp    Pulse 96 10/05/21 1216  Resp 19 10/05/21 1216  SpO2 100 % 10/05/21 1216  Vitals shown include unvalidated device data.  Last Pain:  Vitals:   10/05/21 0612  TempSrc:   PainSc: 7          Complications: No notable events documented.

## 2021-10-05 NOTE — H&P (Signed)
[facility] [place]  ASSESSMENT / PLAN: Melissa Chaney is a 55 y.o. 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  Patient counseled patients with asymptomatic peripheral arterial disease or claudication have a 1-2% risk of developing chronic limb threatening ischemia, but a 15-30% risk of mortality in the next 5 years. Intervention should only be considered for medically optimized patients with disabling symptoms.   Recommend the following which can slow the progression of atherosclerosis and reduce the risk of major adverse cardiac / limb events:  Complete cessation from all tobacco products. Blood glucose control with goal A1c < 7%. Blood pressure control with goal blood pressure < 140/90 mmHg. Lipid reduction therapy with goal LDL-C <100 mg/dL (<70 if symptomatic from PAD).  Aspirin 32m PO QD.  Atorvastatin 40-80mg PO QD (or other "high intensity" statin therapy).  Symptoms are quite disabling.  Her left foot is bordering on ischemic rest pain.  I think intervention is warranted.  We discussed the options for intervention including a minimally invasive approach (aortoiliac stenting) versus open approach (aortobifemoral bypass grafting).  I counseled her that open surgery is more durable, but carries significantly more risk including: death, stroke, pneumonia, respiratory failure, renal failure, MI.  We will see what her CT angiogram looks like before discussing further options.  We will plan to discuss the results with her by phone as soon as they are available.  CHIEF COMPLAINT: Bilateral leg pain  HISTORY OF PRESENT ILLNESS: POLEVIA WESTERVELTis a 55y.o. female who presents to clinic for evaluation of bilateral leg pain.  The patient reports this is been slowly progressive over the past year.  The patient reports fairly typical symptoms of disabling claudication in her thighs and calves.  She also  has pain in her left foot that will occasionally awaken her at night.  He has no ulcers about her feet.  VASCULAR SURGICAL HISTORY: none  VASCULAR RISK FACTORS: Negative history of stroke / transient ischemic attack. Negative history of coronary artery disease.  Negative history of diabetes mellitus.  Positive history of smoking. Not actively smoking. Negative history of hypertension.  Negative history of chronic kidney disease.  Negative history of chronic obstructive pulmonary disease.  FUNCTIONAL STATUS: ECOG performance status: (1) Restricted in physically strenuous activity, ambulatory and able to do work of light nature Ambulatory status: Ambulatory within the community with limits  Past Medical History:  Diagnosis Date   Arthritis    [place]    Depression    Headache    [place]ipheral artery disease) [place]     Past Surgical History:  Procedure Laterality Date   ABDOMINAL SURGERY     Patient states part of intestine was removed    Family History  Problem Relation Age of Onset   Crohn's disease Mother    Hyperlipidemia Father    Heart failure Father    Heart attack Father 715  Crohn's disease Father    Crohn's disease Sister    Ovarian cancer Maternal Grandmother    Diabetes Paternal Grandmother    Diabetes Son     Social History   Socioeconomic History   Marital status: Married    Spouse name: Not on file   Number of children: 2   Years of education: Not on file   Highest education level: Not on file  Occupational History   Occupation: bSecretary/administrator Tobacco Use   Smoking status: Every Day  Packs/day: 0.50    Years: 40.00    Pack years: 20.00    Types: Cigarettes   Smokeless tobacco: Never  Vaping Use   Vaping Use: Never used  Substance and Sexual Activity   Alcohol use: Yes    Comment: occ   Drug use: No   Sexual activity: Not on file  Other Topics Concern   Not on file  Social History Narrative   Not on file   Social  Determinants of Health   Financial Resource Strain: Not on file  Food Insecurity: Not on file  Transportation Needs: Not on file  Physical Activity: Not on file  Stress: Not on file  Social Connections: Not on file  Intimate Partner Violence: Not on file    Allergies  Allergen Reactions   Codeine Itching    Current Facility-Administered Medications  Medication Dose Route Frequency Provider Last Rate Last Admin   0.9 %  sodium chloride infusion   Intravenous Continuous Cherre Robins, MD       ceFAZolin (ANCEF) 2-4 GM/100ML-% IVPB            ceFAZolin (ANCEF) IVPB 2g/100 mL premix  2 g Intravenous 30 min Pre-Op Cherre Robins, MD       Chlorhexidine Gluconate Cloth 2 % PADS 6 each  6 each Topical Once Cherre Robins, MD       And   Chlorhexidine Gluconate Cloth 2 % PADS 6 each  6 each Topical Once Cherre Robins, MD        REVIEW OF SYSTEMS:  [X]  denotes positive finding, [ ]  denotes negative finding Cardiac  Comments:  Chest pain or chest pressure:    Shortness of breath upon exertion:    Short of breath when lying flat:    Irregular heart rhythm:        Vascular    Pain in calf, thigh, or hip brought on by ambulation: x   Pain in feet at night that wakes you up from your sleep:     Blood clot in your veins:    Leg swelling:         Pulmonary    Oxygen at home:    Productive cough:     Wheezing:         Neurologic    Sudden weakness in arms or legs:  x   Sudden numbness in arms or legs:  x   Sudden onset of difficulty speaking or slurred speech:    Temporary loss of vision in one eye:     Problems with dizziness:         Gastrointestinal    Blood in stool:     Vomited blood:         Genitourinary    Burning when urinating:     Blood in urine:        Psychiatric    Major depression:         Hematologic    Bleeding problems:    Problems with blood clotting too easily:        Skin    Rashes or ulcers:        Constitutional    Fever or  chills:      PHYSICAL EXAM Vitals:   10/05/21 0541  BP: (!) 139/41  Pulse: (!) 50  Resp: 17  Temp: (!) 97.5 F (36.4 C)  TempSrc: Oral  SpO2: 99%  Weight: 48.3 kg  Height: 5' 6"  (1.676 m)  Constitutional: well appearing. no distress. Thin - marginally nourished.  Neurologic: CN intact. No focal findings. No sensory loss. Psychiatric:  Mood and affect symmetric and appropriate. Eyes:  No icterus. No conjunctival pallor. Ears, nose, throat:  mucous membranes moist. Midline trachea.  Cardiac: regular rate and rhythm.  Respiratory:  unlabored. Abdominal:  soft, non-tender, non-distended.  Peripheral vascular: no palpable femoral pulses. No palpable popliteal pulses. No palpable pedal pulses. Extremity: No edema. No cyanosis. No pallor.  Skin: No gangrene. No ulceration.  Lymphatic: No Stemmer's sign. No palpable lymphadenopathy.  PERTINENT LABORATORY AND RADIOLOGIC DATA  Most recent CBC CBC Latest Ref Rng & Units 09/27/2021 08/01/2021 10/16/2018  WBC 4.0 - 10.5 K/uL 9.2 10.5 13.6(H)  Hemoglobin 12.0 - 15.0 g/dL 13.8 13.2 13.3  Hematocrit 36.0 - 46.0 % 42.5 38.5 39.4  Platelets 150 - 400 K/uL 152 219 240.0     Most recent CMP CMP Latest Ref Rng & Units 09/27/2021 08/01/2021 11/09/2017  Glucose 70 - 99 mg/dL 114(H) 89 89  BUN 6 - 20 mg/dL 9 11 9   Creatinine 0.44 - 1.00 mg/dL 0.90 0.78 0.77  Sodium 135 - 145 mmol/L 136 141 142  Potassium 3.5 - 5.1 mmol/L 4.3 4.3 3.6  Chloride 98 - 111 mmol/L 101 101 102  CO2 22 - 32 mmol/L 25 25 32  Calcium 8.9 - 10.3 mg/dL 9.8 10.1 9.9  Total Protein 6.5 - 8.1 g/dL 7.0 6.8 6.9  Total Bilirubin 0.3 - 1.2 mg/dL 1.0 0.3 0.6  Alkaline Phos 38 - 126 U/L 78 82 77  AST 15 - 41 U/L 23 17 13   ALT 0 - 44 U/L 24 13 8    LDL Chol Calc (NIH)  Date Value Ref Range Status  08/01/2021 90 0 - 99 mg/dL Final     Vascular Imaging:  +-------+-----------+-----------+------------+------------+  ABI/TBIToday's ABIToday's TBIPrevious ABIPrevious  TBI  +-------+-----------+-----------+------------+------------+  Right  0.32                                            +-------+-----------+-----------+------------+------------+  Left   0.33       0.10                                 +-------+-----------+-----------+------------+------------+    Bilateral lower extremity arterial duplex: Monophasic waveforms throughout bilateral lower extremities,  suggestive of proximal obstruction.   [name]. [name], MD [facility] Phone Number: [phone] 10/05/2021 7:22 AM  Total time spent on preparing this encounter including chart review, data review, collecting history, examining the patient, coordinating care for this new patient, 60 minutes.  Portions of this report may have been transcribed using voice recognition software.  Every effort has been made to ensure accuracy; however, inadvertent computerized transcription errors may still be present.

## 2021-10-06 ENCOUNTER — Encounter (HOSPITAL_COMMUNITY): Payer: Self-pay | Admitting: Vascular Surgery

## 2021-10-06 ENCOUNTER — Other Ambulatory Visit (HOSPITAL_COMMUNITY): Payer: Self-pay

## 2021-10-06 ENCOUNTER — Other Ambulatory Visit: Payer: Self-pay | Admitting: Family Medicine

## 2021-10-06 ENCOUNTER — Inpatient Hospital Stay (HOSPITAL_COMMUNITY): Payer: 59

## 2021-10-06 DIAGNOSIS — I739 Peripheral vascular disease, unspecified: Secondary | ICD-10-CM

## 2021-10-06 DIAGNOSIS — Z95828 Presence of other vascular implants and grafts: Secondary | ICD-10-CM

## 2021-10-06 LAB — COMPREHENSIVE METABOLIC PANEL
ALT: 18 U/L (ref 0–44)
AST: 26 U/L (ref 15–41)
Albumin: 3.1 g/dL — ABNORMAL LOW (ref 3.5–5.0)
Alkaline Phosphatase: 47 U/L (ref 38–126)
Anion gap: 6 (ref 5–15)
BUN: 8 mg/dL (ref 6–20)
CO2: 27 mmol/L (ref 22–32)
Calcium: 8.1 mg/dL — ABNORMAL LOW (ref 8.9–10.3)
Chloride: 102 mmol/L (ref 98–111)
Creatinine, Ser: 0.79 mg/dL (ref 0.44–1.00)
GFR, Estimated: >60 mL/min (ref >60)
Glucose, Bld: 133 mg/dL — ABNORMAL HIGH (ref 70–99)
Potassium: 4.2 mmol/L (ref 3.5–5.1)
Sodium: 135 mmol/L (ref 135–145)
Total Bilirubin: 1 mg/dL (ref 0.3–1.2)
Total Protein: 5.6 g/dL — ABNORMAL LOW (ref 6.5–8.1)

## 2021-10-06 LAB — CBC
HCT: 33.1 % — ABNORMAL LOW (ref 36.0–46.0)
Hemoglobin: 11.3 g/dL — ABNORMAL LOW (ref 12.0–15.0)
MCH: 33.2 pg (ref 26.0–34.0)
MCHC: 34.1 g/dL (ref 30.0–36.0)
MCV: 97.4 fL (ref 80.0–100.0)
Platelets: 130 10*3/uL — ABNORMAL LOW (ref 150–400)
RBC: 3.4 MIL/uL — ABNORMAL LOW (ref 3.87–5.11)
RDW: 13.4 % (ref 11.5–15.5)
WBC: 13.6 10*3/uL — ABNORMAL HIGH (ref 4.0–10.5)
nRBC: 0 % (ref 0.0–0.2)

## 2021-10-06 LAB — POCT I-STAT 7, (LYTES, BLD GAS, ICA,H+H)
Acid-base deficit: 1 mmol/L (ref 0.0–2.0)
Bicarbonate: 24.9 mmol/L (ref 20.0–28.0)
Calcium, Ion: 1.14 mmol/L — ABNORMAL LOW (ref 1.15–1.40)
HCT: 36 % (ref 36.0–46.0)
Hemoglobin: 12.2 g/dL (ref 12.0–15.0)
O2 Saturation: 90 %
Patient temperature: 98.3
Potassium: 4.1 mmol/L (ref 3.5–5.1)
Sodium: 136 mmol/L (ref 135–145)
TCO2: 26 mmol/L (ref 22–32)
pCO2 arterial: 46 mmHg (ref 32.0–48.0)
pH, Arterial: 7.341 — ABNORMAL LOW (ref 7.350–7.450)
pO2, Arterial: 61 mmHg — ABNORMAL LOW (ref 83.0–108.0)

## 2021-10-06 LAB — LIPID PANEL
Cholesterol: 88 mg/dL (ref 0–200)
HDL: 58 mg/dL (ref >40)
LDL Cholesterol: 17 mg/dL (ref 0–99)
Total CHOL/HDL Ratio: 1.5 RATIO
Triglycerides: 66 mg/dL (ref <150)
VLDL: 13 mg/dL (ref 0–40)

## 2021-10-06 LAB — HEMOGLOBIN A1C
Hgb A1c MFr Bld: 7.1 % — ABNORMAL HIGH (ref 4.8–5.6)
Mean Plasma Glucose: 157.07 mg/dL

## 2021-10-06 LAB — MAGNESIUM: Magnesium: 2 mg/dL (ref 1.7–2.4)

## 2021-10-06 LAB — AMYLASE: Amylase: 45 U/L (ref 28–100)

## 2021-10-06 MED ORDER — ASPIRIN EC 81 MG PO TBEC
81.0000 mg | DELAYED_RELEASE_TABLET | Freq: Every day | ORAL | Status: DC
  Administered 2021-10-06 – 2021-10-10 (×5): 81 mg via ORAL
  Filled 2021-10-06 (×5): qty 1

## 2021-10-06 MED ORDER — MORPHINE SULFATE 1 MG/ML IV SOLN PCA
INTRAVENOUS | Status: DC
  Administered 2021-10-06: 12 mg via INTRAVENOUS
  Administered 2021-10-06 (×2): 1 mg via INTRAVENOUS
  Administered 2021-10-07: 22 mg via INTRAVENOUS
  Administered 2021-10-07: 18 mg via INTRAVENOUS
  Administered 2021-10-07: 1 mg via INTRAVENOUS
  Administered 2021-10-07: 13.5 mg via INTRAVENOUS
  Administered 2021-10-08: 9 mg via INTRAVENOUS
  Administered 2021-10-08: 5.7 mg via INTRAVENOUS
  Administered 2021-10-08: 10.5 mg via INTRAVENOUS
  Administered 2021-10-09: 02:00:00 1.5 mL via INTRAVENOUS
  Filled 2021-10-06 (×6): qty 30

## 2021-10-06 MED ORDER — SODIUM CHLORIDE 0.9% FLUSH: 9.0000 mL | INTRAVENOUS | Status: DC | PRN

## 2021-10-06 MED ORDER — NALOXONE HCL 0.4 MG/ML IJ SOLN: 0.4000 mg | INTRAMUSCULAR | Status: DC | PRN

## 2021-10-06 MED ORDER — LISINOPRIL 10 MG PO TABS
20.0000 mg | ORAL_TABLET | Freq: Every day | ORAL | Status: DC
  Administered 2021-10-06 – 2021-10-10 (×5): 20 mg via ORAL
  Filled 2021-10-06: qty 1
  Filled 2021-10-06: qty 2
  Filled 2021-10-06: qty 1
  Filled 2021-10-06 (×2): qty 2

## 2021-10-06 MED ORDER — DIPHENHYDRAMINE HCL 12.5 MG/5ML PO ELIX
12.5000 mg | ORAL_SOLUTION | Freq: Four times a day (QID) | ORAL | Status: DC | PRN
  Filled 2021-10-06: qty 5

## 2021-10-06 MED ORDER — LACTATED RINGERS IV SOLN: INTRAVENOUS | Status: DC

## 2021-10-06 MED ORDER — IPRATROPIUM-ALBUTEROL 0.5-2.5 (3) MG/3ML IN SOLN
3.0000 mL | RESPIRATORY_TRACT | Status: DC | PRN
  Administered 2021-10-06: 3 mL via RESPIRATORY_TRACT
  Filled 2021-10-06: qty 3

## 2021-10-06 MED ORDER — DIPHENHYDRAMINE HCL 50 MG/ML IJ SOLN: 12.5000 mg | Freq: Four times a day (QID) | INTRAMUSCULAR | Status: DC | PRN

## 2021-10-06 NOTE — Progress Notes (Signed)
OT Cancellation Note  Patient Details Name: Melissa Chaney MRN: [id number] DOB: 01/24/1966   Cancelled Treatment:    Reason Eval/Treat Not Completed: Pain limiting ability to participate Per RN, pt with high pain levels and hoping for alternative pain control methods today. Will check back as schedule permits for OT eval.  Layla Maw 10/06/2021, 9:57 AM

## 2021-10-06 NOTE — Evaluation (Signed)
Physical Therapy Evaluation Patient Details Name: Melissa Chaney MRN: [id number] DOB: [birth year] Today's Date: 10/06/2021  History of Present Illness  Melissa Chaney is a 55 y.o. female with progressive bilateral leg pain due to  aortoiliac occlusive disease with claudication. Pt elected to undergo aortobifemoral bypass grafting on 11/30. PMH: arthritis, Crohn's Disease, depression and PAD.   Clinical Impression  Pt OOB in recliner upon arrival of PT, agreeable to evaluation at this time. Prior to admission the pt was ambulating short distances in the home, reports she was limited in distance due to pain and had resorted to use of cane recently. The pt now presents with limitations in functional mobility, strength, BLE power, hip ROM, and activity tolerance due to above dx, and will continue to benefit from skilled PT to address these deficits. The pt required minA to power up form seated position and required increased time and effort as well as use of UE to complete the movement. The pt was then able to initiate gait in the room, but was limited to two short bouts due to HR elevation and onset of nausea. Suspect the pt will progress well, but will continue to benefit from skilled PT acutely and following d/c to maximize independence with mobility and improved activity tolerance for mobility in the home.         Recommendations for follow up therapy are one component of a multi-disciplinary discharge planning process, led by the attending physician.  Recommendations may be updated based on patient status, additional functional criteria and insurance authorization.  Follow Up Recommendations Home health PT    Assistance Recommended at Discharge Intermittent Supervision/Assistance  Functional Status Assessment Patient has had a recent decline in their functional status and demonstrates the ability to make significant improvements in function in a reasonable and predictable amount of time.   Equipment Recommendations  Rolling walker (2 wheels)    Recommendations for Other Services       Precautions / Restrictions Precautions Precautions: Fall;Other (comment) Precaution Comments: A-line, monitor HR/O2 (does not wear O2 at baseline) Restrictions Weight Bearing Restrictions: No      Mobility  Bed Mobility Overal bed mobility: Needs Assistance Bed Mobility: Sit to Supine Rolling: Min guard Sidelying to sit: Min assist   Sit to supine: Min assist   General bed mobility comments: minA to bring BLE into bed    Transfers Overall transfer level: Needs assistance Equipment used: Rolling walker (2 wheels) Transfers: Sit to/from Stand;Bed to chair/wheelchair/BSC Sit to Stand: Min assist   Step pivot transfers: Min assist       General transfer comment: minA to power up with cues for reduced wt bearing on RUE, slow to power up. minA to steady and manage lines for stepping to bed    Ambulation/Gait Ambulation/Gait assistance: Min assist;+2 safety/equipment Gait Distance (Feet): 4 Feet (+ 4 ft lateral stepping along EOB) Assistive device: Rolling walker (2 wheels) Gait Pattern/deviations: Step-to pattern;Decreased stride length;Trunk flexed;Narrow base of support Gait velocity: decreased Gait velocity interpretation: <1.31 ft/sec, indicative of household ambulator   General Gait Details: pt with trunk flexed due to pain, HR increased to 156bpm with gait/pain. seated rest due to nausea after 4 ft, then seated rest, then 4 ft lateral stepping along EOB      Balance Overall balance assessment: Needs assistance Sitting-balance support: No upper extremity supported;Feet supported Sitting balance-Leahy Scale: Fair     Standing balance support: Single extremity supported;During functional activity Standing balance-Leahy Scale: Fair Standing balance comment: fair  static standing, benefits from UE support for dynamic tasks                              Pertinent Vitals/Pain Pain Assessment: Faces Faces Pain Scale: Hurts whole lot Pain Location: abdomen Pain Descriptors / Indicators: Grimacing;Guarding Pain Intervention(s): Limited activity within patient's tolerance;Monitored during session;Repositioned;PCA encouraged    Home Living Family/patient expects to be discharged to:: Private residence Living Arrangements: Spouse/significant other Available Help at Discharge: Family;Friend(s);Neighbor;Other (Comment) (reports plenty of support nearby who can assist) Type of Home: House Home Access: Stairs to enter   [employer] of Steps: 2-3 + 2-3 with landing in between (?)   Home Layout: One level Home Equipment: Cane - single point;Shower seat;BSC/3in1      Prior Function Prior Level of Function : Independent/Modified Independent             Mobility Comments: had been using a cane more recently due to pain ADLs Comments: Independent with ADLs, household IADLs. Had not been driving or going out to stores much due to pain     Hand Dominance   Dominant Hand: Right    Extremity/Trunk Assessment   Upper Extremity Assessment Upper Extremity Assessment: Defer to OT evaluation    Lower Extremity Assessment Lower Extremity Assessment: Overall WFL for tasks assessed (pt able to generate good force production, limited by surgical pain only)    Cervical / Trunk Assessment Cervical / Trunk Assessment: Normal  Communication   Communication: No difficulties  Cognition Arousal/Alertness: Awake/alert Behavior During Therapy: WFL for tasks assessed/performed Overall Cognitive Status: Within Functional Limits for tasks assessed                                          General Comments General comments (skin integrity, edema, etc.): husband present and supportive. pt given zofran during session to avoid vomiting. HR elevated to 156bpm max        Assessment/Plan    PT Assessment Patient needs  continued PT services  PT Problem List Decreased strength;Decreased range of motion;Decreased activity tolerance;Decreased balance;Decreased mobility;Pain       PT Treatment Interventions DME instruction;Gait training;Functional mobility training;Stair training;Therapeutic activities;Therapeutic exercise;Balance training;Patient/family education    PT Goals (Current goals can be found in the Care Plan section)  Acute Rehab PT Goals Patient Stated Goal: get back to gardening PT Goal Formulation: With patient Time For Goal Achievement: 10/20/21 Potential to Achieve Goals: Good    Frequency Min 3X/week    AM-PAC PT "6 Clicks" Mobility  Outcome Measure Help needed turning from your back to your side while in a flat bed without using bedrails?: A Little Help needed moving from lying on your back to sitting on the side of a flat bed without using bedrails?: A Little Help needed moving to and from a bed to a chair (including a wheelchair)?: A Little Help needed standing up from a chair using your arms (e.g., wheelchair or bedside chair)?: A Little Help needed to walk in hospital room?: Total Help needed climbing 3-5 steps with a railing? : Total 6 Click Score: 14    End of Session Equipment Utilized During Treatment: Gait belt;Oxygen Activity Tolerance: Treatment limited secondary to medical complications (Comment) (nausea) Patient left: in bed;with call bell/phone within reach;with family/visitor present Nurse Communication: Mobility status (nausea and HR) PT Visit Diagnosis: Other  abnormalities of gait and mobility (R26.89);Unsteadiness on feet (R26.81);Pain    Time: 8022-3361 PT Time Calculation (min) (ACUTE ONLY): 41 min   Charges:   PT Evaluation $PT Eval Moderate Complexity: 1 Mod PT Treatments $Gait Training: 8-22 mins $Therapeutic Activity: 8-22 mins        West Carbo, PT, DPT   Acute Rehabilitation Department Pager #: [phone]  Sandra Cockayne 10/06/2021,  4:28 PM

## 2021-10-06 NOTE — Progress Notes (Addendum)
Progress Note    10/06/2021 7:10 AM 1 Day Post-Op  Subjective:  incisional pain. States she tried to get up this morning and did not tolerate well. Breathing is improved after treatment. On NRB   Vitals:   10/06/21 0600 10/06/21 0648  BP: 137/65   Pulse: 97   Resp: 15   Temp:    SpO2: 96% 90%   Physical Exam: Cardiac:  regular Lungs:  labored, NRB 4 L Incisions:  laparotomy incision, bilateral groin incisions are intact and well appearing. No swelling or hematoma. Expected tenderness present Extremities:  well perfused and warm. Left PT/ Pero signals. Left foot warm. Motor and sensation intact. Right Dp palpable. Doppler Right PT. Right foot warm. Motor and sensation intact Abdomen:  soft, non distended. Expected tenderness present Neurologic: alert and oriented  CBC    Component Value Date/Time   WBC 13.6 (H) 10/06/2021 0454   RBC 3.40 (L) 10/06/2021 0454   HGB 12.2 10/06/2021 0700   HGB 13.2 08/01/2021 1048   HCT 36.0 10/06/2021 0700   HCT 38.5 08/01/2021 1048   PLT 130 (L) 10/06/2021 0454   PLT 219 08/01/2021 1048   MCV 97.4 10/06/2021 0454   MCV 97 08/01/2021 1048   MCH 33.2 10/06/2021 0454   MCHC 34.1 10/06/2021 0454   RDW 13.4 10/06/2021 0454   RDW 13.2 08/01/2021 1048   LYMPHSABS 1.7 10/16/2018 1406   MONOABS 0.6 10/16/2018 1406   EOSABS 0.0 10/16/2018 1406   BASOSABS 0.1 10/16/2018 1406    BMET    Component Value Date/Time   NA 136 10/06/2021 0700   NA 141 08/01/2021 1048   K 4.1 10/06/2021 0700   CL 102 10/06/2021 0454   CO2 27 10/06/2021 0454   GLUCOSE 133 (H) 10/06/2021 0454   BUN 8 10/06/2021 0454   BUN 11 08/01/2021 1048   CREATININE 0.79 10/06/2021 0454   CALCIUM 8.1 (L) 10/06/2021 0454   GFRNONAA >60 10/06/2021 0454   GFRAA >60 11/07/2015 0219    INR    Component Value Date/Time   INR 1.0 10/05/2021 1506     Intake/Output Summary (Last 24 hours) at 10/06/2021 0710 Last data filed at 10/06/2021 0500 Gross per 24 hour  Intake  4158.4 ml  Output 1900 ml  Net 2258.4 ml     Assessment/Plan:  55 y.o. female is s/p aortobifemoral bypass 1 Day Post-Op   General: appears in discomfort, not in any acute distress. PCA for pain management Vascular: lower extremities are well perfused and warm. Palpable right DP/ doppler PT. Doppler left PT/ pero signals. Feet warm. Motor and sensation intact Incisions: B groins and laparotomy incisions are intact and well appearing, no swelling or hematoma Cardiac: hemodynamically stable, tachy Pulmonary: slightly labored breathing, received nebulizer treatment this morning with improvement,  4L O2 NRB. Chest x-ray okay. Encourage IS Renal: Normal BUN/ Cr. Scr stable. Good UOP. IVF decreased. Hold on diuretics for now GI: No flatus or BM. Keep NPO. Wait on return of bowel function Heme: Hgb stable Neuro: Intact  Out of bed to chair today. PT/ OT to eval  DVT prophylaxis: sq heparin     Karoline Caldwell, PA-C [facility] [phone] 10/06/2021 7:10 AM  VASCULAR STAFF ADDENDUM: I have independently interviewed and examined the patient. I agree with the above.  Difficulty breathing with mobilization this AM - likely multifactorial: pain, COPD, atelectasis. Encourage mobilization to chair and pulmonary toilet. Add PCA for pain control. Otherwise vitals, exam, labs very reassuring. Good  doppler flow in the feet. Keep in ICU today.  [name]. [name], MD [facility] Phone Number: [phone] 10/06/2021 9:50 AM

## 2021-10-06 NOTE — Evaluation (Signed)
Occupational Therapy Evaluation Patient Details Name: Melissa Chaney MRN: [id number] DOB: May 13, 1966 Today's Date: 10/06/2021   History of Present Illness Melissa Chaney is a 55 y.o. female with progressive bilateral leg pain due to  aortoiliac occlusive disease with claudication. Pt elected to undergo aortobifemoral bypass grafting on 11/30. PMH: arthritis, Crohn's Disease, depression and PAD.   Clinical Impression   PTA, pt lives with spouse and reports typically independent with ADLs and household IADLs with recent use of cane for mobility due to pain. Pt presents now s/p procedure above with deficits in pain, strength, endurance and dynamic balance. Despite abdominal pain, pt motivated to attempt OOB activities with overall Min A x 1-2 for bed mobility and pivot to recliner. Pt requires Min A for UB ADLs and up to Max A for LB ADLs due to deficits. Began education on compensatory strategies for LB ADLs with further reinforcement needed in next sessions. Pt reports good family support and anticipate pt to progress well once pain controlled.   SpO2 94% on 4 L O2 after activity (delayed reading) HR up to low 130s  BP pre-activity:132/46 BP initially after transfer: 149/68 BP after sitting up in chair > 5 min: 134/56      Recommendations for follow up therapy are one component of a multi-disciplinary discharge planning process, led by the attending physician.  Recommendations may be updated based on patient status, additional functional criteria and insurance authorization.   Follow Up Recommendations  Home health OT    Assistance Recommended at Discharge Intermittent Supervision/Assistance  Functional Status Assessment  Patient has had a recent decline in their functional status and demonstrates the ability to make significant improvements in function in a reasonable and predictable amount of time.  Equipment Recommendations  Other (comment) (TBD; pending progress)    Recommendations  for Other Services       Precautions / Restrictions Precautions Precautions: Fall;Other (comment) Precaution Comments: A-line, monitor HR/O2 (does not wear O2 at baseline) Restrictions Weight Bearing Restrictions: No      Mobility Bed Mobility Overal bed mobility: Needs Assistance Bed Mobility: Rolling;Sidelying to Sit Rolling: Min guard Sidelying to sit: Min assist       General bed mobility comments: Min A to lift trunk, increased time and cueing for log roll due to pain    Transfers Overall transfer level: Needs assistance Equipment used: 1 person hand held assist;2 person hand held assist Transfers: Sit to/from Stand;Bed to chair/wheelchair/BSC Sit to Stand: Min assist     Step pivot transfers: Min assist;+2 safety/equipment     General transfer comment: Min A to stand and gain balance, increased time to rise. Light MIn A via handheld assist to step to chair (son also present and assisting)      Balance Overall balance assessment: Needs assistance Sitting-balance support: No upper extremity supported;Feet supported Sitting balance-Leahy Scale: Fair     Standing balance support: Single extremity supported;During functional activity Standing balance-Leahy Scale: Fair Standing balance comment: fair static standing, benefits from UE support for dynamic tasks                           ADL either performed or assessed with clinical judgement   ADL Overall ADL's : Needs assistance/impaired Eating/Feeding: Set up;Sitting   Grooming: Set up;Sitting   Upper Body Bathing: Minimal assistance;Sitting   Lower Body Bathing: Maximal assistance;Sit to/from stand   Upper Body Dressing : Minimal assistance;Sitting   Lower Body Dressing: Maximal  assistance;Sit to/from stand Lower Body Dressing Details (indicate cue type and reason): assist to don socks, unable to due to pain. Educated on strategies to further assess with LB dressing Toilet Transfer: Minimal  assistance;+2 for safety/equipment;Stand-pivot Toilet Transfer Details (indicate cue type and reason): simulated to recliner Toileting- Clothing Manipulation and Hygiene: Maximal assistance         General ADL Comments: Limited primarily by pain and expected post op difficulties with reaching due to abdominal incision     Vision Ability to See in Adequate Light: 0 Adequate Patient Visual Report: No change from baseline Vision Assessment?: No apparent visual deficits     Perception     Praxis      Pertinent Vitals/Pain Pain Assessment: Faces Faces Pain Scale: Hurts whole lot Pain Location: abdomen Pain Descriptors / Indicators: Grimacing;Guarding Pain Intervention(s): Monitored during session;PCA encouraged;Repositioned;Limited activity within patient's tolerance     Hand Dominance Right   Extremity/Trunk Assessment Upper Extremity Assessment Upper Extremity Assessment: Generalized weakness   Lower Extremity Assessment Lower Extremity Assessment: Defer to PT evaluation   Cervical / Trunk Assessment Cervical / Trunk Assessment: Normal   Communication Communication Communication: No difficulties   Cognition Arousal/Alertness: Awake/alert Behavior During Therapy: WFL for tasks assessed/performed Overall Cognitive Status: Within Functional Limits for tasks assessed                                       General Comments  Son and daughter entering during session    Exercises     Shoulder Instructions      Home Living Family/patient expects to be discharged to:: Private residence Living Arrangements: Spouse/significant other Available Help at Discharge: Family;Friend(s);Neighbor;Other (Comment) (reports plenty of support nearby who can assist) Type of Home: House Home Access: Stairs to enter [employer] of Steps: 2-3 + 2-3 with landing in between (?)   Home Layout: One level     Bathroom Shower/Tub: Radiographer, therapeutic: Handicapped height     Home Equipment: [place] - single point;Shower seat          Prior Functioning/Environment Prior Level of Function : Independent/Modified Independent             Mobility Comments: had been using a cane more recently due to pain ADLs Comments: Independent with ADLs, household IADLs. Had not been driving or going out to stores much due to pain        OT Problem List: Decreased strength;Decreased activity tolerance;Impaired balance (sitting and/or standing);Pain;Decreased knowledge of use of DME or AE;Decreased knowledge of precautions      OT Treatment/Interventions: Self-care/ADL training;Therapeutic exercise;Energy conservation;DME and/or AE instruction;Therapeutic activities;Patient/family education;Balance training    OT Goals(Current goals can be found in the care plan section) Acute Rehab OT Goals Patient Stated Goal: decrease pain OT Goal Formulation: With patient Time For Goal Achievement: 10/20/21 Potential to Achieve Goals: Good  OT Frequency: Min 2X/week   Barriers to D/C:            Co-evaluation              AM-PAC OT "6 Clicks" Daily Activity     Outcome Measure Help from another person eating meals?: A Little Help from another person taking care of personal grooming?: A Little Help from another person toileting, which includes using toliet, bedpan, or urinal?: A Lot Help from another person bathing (including washing, rinsing, drying)?: A Lot  Help from another person to put on and taking off regular upper body clothing?: A Little Help from another person to put on and taking off regular lower body clothing?: A Lot 6 Click Score: 15   End of Session Equipment Utilized During Treatment: Oxygen Nurse Communication: Mobility status  Activity Tolerance: Patient limited by pain Patient left: in chair;with call bell/phone within reach;with family/visitor present;with nursing/sitter in room  OT Visit Diagnosis:  Unsteadiness on feet (R26.81);Other abnormalities of gait and mobility (R26.89);Muscle weakness (generalized) (M62.81);Pain Pain - part of body:  (abdomen)                Time: 6503-5465 OT Time Calculation (min): 39 min Charges:  OT General Charges $OT Visit: 1 Visit OT Evaluation $OT Eval Moderate Complexity: 1 Mod OT Treatments $Therapeutic Activity: 23-37 mins  Malachy Chamber, OTR/L Acute Rehab Services Office: [phone]   Layla Maw 10/06/2021, 2:27 PM

## 2021-10-06 NOTE — Progress Notes (Addendum)
At approx 0615, got patient to dangle on the side of bed. Patient tolerated mobility poorly. Patient laid back in bed. Patient tachypneic, labored breathing, pulse ox sating in the 60s/70s with a poor waveform, tachycardic in the 130s/140s, c/o difficulty breathing d/t chest pain and abdominal pain. Patient sounding rhonchorous. Patient placed on HFNC at 15 L. Gave patient 3 mg of metoprolol and HR went down to 90s/100s. Page called Dr [name]. Received verbal orders to stop IV fluids, ok to give another dose of morphine at this time for pain control, and to give a duoneb treatment until he comes to bedside to assess patient this AM. Gave patient 2 mg morphine, put a nonrebreather on patient, and RT gave her a duoneb treatment. Patient recovering slowly, breathing easier. Reassessed lungs and lungs sounded clear. Changed pulse ox location and got better waveform with patient sating in upper 80s/low 90s. RT got an ABG on patient, see results review. Hawken MD now at bedside assessing patient.

## 2021-10-06 NOTE — TOC Benefit Eligibility Note (Signed)
Patient Teacher, English as a foreign language completed.    The patient is currently admitted and upon discharge could be taking Jardiance 10 mg.  The current 30 day co-pay is, $200.00 due to a $4,950.00 deductible remaining.   The patient is currently admitted and upon discharge could be taking Farxiga 10 mg.  The current 30 day co-pay is, $75.00 due to a $4,950.00 deductible remaining. [place] 10 MG TABLET paid 125.00 toward plan copay  The patient is insured through [place], [place] Patient Advocate Specialist [place] Patient Advocate Team Direct Number: [phone]  Fax: [phone]

## 2021-10-06 NOTE — Progress Notes (Signed)
Inpatient Diabetes Program Recommendations  AACE/ADA: New Consensus Statement on Inpatient Glycemic Control (2015)  Target Ranges:  Prepandial:   less than 140 mg/dL      Peak postprandial:   less than 180 mg/dL (1-2 hours)      Critically ill patients:  140 - 180 mg/dL   Lab Results  Component Value Date   HGBA1C 7.1 (H) 10/06/2021    Review of Glycemic Control  Latest Reference Range & Units 08/01/21 10:48 09/27/21 08:36 10/05/21 15:06 10/06/21 04:54  Glucose 70 - 99 mg/dL 89 114 (H) 189 (H) 133 (H)  (H): Data is abnormally high  Diabetes history: ? New diagnosis of DM Outpatient Diabetes medications: None Current orders for Inpatient glycemic control:  None ordered Inpatient Diabetes Program Recommendations:    Referral received.  Will follow up with patient on 12/2 to discuss A1C results and need for close f/u.  Thanks,  Adah Perl, RN, BC-ADM Inpatient Diabetes Coordinator Pager [phone]  (8a-5p)

## 2021-10-07 LAB — GLUCOSE, CAPILLARY
Glucose-Capillary: 144 mg/dL — ABNORMAL HIGH (ref 70–99)
Glucose-Capillary: 129 mg/dL — ABNORMAL HIGH (ref 70–99)
Glucose-Capillary: 149 mg/dL — ABNORMAL HIGH (ref 70–99)

## 2021-10-07 LAB — CBC
HCT: 28.4 % — ABNORMAL LOW (ref 36.0–46.0)
Hemoglobin: 9.4 g/dL — ABNORMAL LOW (ref 12.0–15.0)
MCH: 32.9 pg (ref 26.0–34.0)
MCHC: 33.1 g/dL (ref 30.0–36.0)
MCV: 99.3 fL (ref 80.0–100.0)
Platelets: 102 10*3/uL — ABNORMAL LOW (ref 150–400)
RBC: 2.86 MIL/uL — ABNORMAL LOW (ref 3.87–5.11)
RDW: 13.7 % (ref 11.5–15.5)
WBC: 14.4 10*3/uL — ABNORMAL HIGH (ref 4.0–10.5)
nRBC: 0 % (ref 0.0–0.2)

## 2021-10-07 LAB — BASIC METABOLIC PANEL
Anion gap: 5 (ref 5–15)
BUN: 9 mg/dL (ref 6–20)
CO2: 27 mmol/L (ref 22–32)
Calcium: 8.1 mg/dL — ABNORMAL LOW (ref 8.9–10.3)
Chloride: 102 mmol/L (ref 98–111)
Creatinine, Ser: 0.67 mg/dL (ref 0.44–1.00)
GFR, Estimated: >60 mL/min (ref >60)
Glucose, Bld: 106 mg/dL — ABNORMAL HIGH (ref 70–99)
Potassium: 4.4 mmol/L (ref 3.5–5.1)
Sodium: 134 mmol/L — ABNORMAL LOW (ref 135–145)

## 2021-10-07 MED ORDER — LIVING WELL WITH DIABETES BOOK
Freq: Once | Status: AC
  Filled 2021-10-07: qty 1

## 2021-10-07 MED ORDER — METOPROLOL TARTRATE 5 MG/5ML IV SOLN
5.0000 mg | Freq: Four times a day (QID) | INTRAVENOUS | Status: DC
  Administered 2021-10-07 – 2021-10-10 (×12): 5 mg via INTRAVENOUS
  Filled 2021-10-07 (×12): qty 5

## 2021-10-07 MED ORDER — ACETAMINOPHEN 325 MG PO TABS
650.0000 mg | ORAL_TABLET | Freq: Four times a day (QID) | ORAL | Status: DC
  Administered 2021-10-07 – 2021-10-10 (×11): 650 mg via ORAL
  Filled 2021-10-07 (×13): qty 2

## 2021-10-07 MED ORDER — INSULIN ASPART 100 UNIT/ML IJ SOLN
0.0000 [IU] | Freq: Three times a day (TID) | INTRAMUSCULAR | Status: DC
  Administered 2021-10-07: 1 [IU] via SUBCUTANEOUS
  Administered 2021-10-08: 2 [IU] via SUBCUTANEOUS
  Administered 2021-10-08: 1 [IU] via SUBCUTANEOUS
  Administered 2021-10-09: 06:00:00 2 [IU] via SUBCUTANEOUS

## 2021-10-07 MED ORDER — KCL IN DEXTROSE-NACL 20-5-0.45 MEQ/L-%-% IV SOLN
INTRAVENOUS | Status: DC
  Filled 2021-10-07 (×4): qty 1000

## 2021-10-07 MED FILL — Heparin Sodium (Porcine) Inj 1000 Unit/ML: INTRAMUSCULAR | Qty: 1 | Status: AC

## 2021-10-07 MED FILL — Sodium Chloride IV Soln 0.9%: INTRAVENOUS | Qty: 1000 | Status: AC

## 2021-10-07 NOTE — Progress Notes (Addendum)
Inpatient Diabetes Program Recommendations  AACE/ADA: New Consensus Statement on Inpatient Glycemic Control (2015)  Target Ranges:  Prepandial:   less than 140 mg/dL      Peak postprandial:   less than 180 mg/dL (1-2 hours)      Critically ill patients:  140 - 180 mg/dL   Lab Results  Component Value Date   GLUCAP 144 (H) 10/07/2021   HGBA1C 7.1 (H) 10/06/2021    Review of Glycemic Control  Latest Reference Range & Units 10/07/21 12:22  Glucose-Capillary 70 - 99 mg/dL 144 (H)   Diabetes history: None Current orders for Inpatient glycemic control:  Novolog sensitive tid with meals  Inpatient Diabetes Program Recommendations:    Spoke with patient and her husband regarding elevated A1C of 7.1%.  Patient states that her blood sugars have not been elevated in the past.  She has a son with Type 1 diabetes and is very familiar with monitoring and insulin.  Patient states that she does not drink sugar in beverages and BMI<20.   ? If elevated CBG's is a stress response to recent medical issues.  Will need close f/u with PCP regarding A1C and blood sugars.  At this point, would just monitor.  If>150 mg/dL, may needs oral agent added at d/c. Patient and husband verbalized understanding.  Ordered patient Living well with DM booklet as well.    Thanks,  Adah Perl, RN, BC-ADM Inpatient Diabetes Coordinator Pager [phone]  (8a-5p)

## 2021-10-07 NOTE — Progress Notes (Signed)
Occupational Therapy Treatment Patient Details Name: Melissa Chaney MRN: [id number] DOB: 1966/02/09 Today's Date: 10/07/2021   History of present illness Melissa Chaney is a 55 y.o. female with progressive bilateral leg pain due to  aortoiliac occlusive disease with claudication. Pt elected to undergo aortobifemoral bypass grafting on 11/30. PMH: arthritis, Crohn's Disease, depression and PAD.   OT comments  Pt seen in conjunction with PT to further mobility attempts safely. Pt received on BSC and able to demo peri care with min guard only and improving pain levels. Pt able to mobilize in hallway using RW and Min A x1-2 with posterior sway noted though pt able to demo self correction. Pt with continued difficulty with LB ADLs due to abdominal incision. Husband present, supportive and able to assist pt as needed. Encouraged use of shower chair for energy conservation strategies at home. DC recs remain appropriate.   113HR  BP post-activity: 131/55 Unreliable SpO2 pleth with 2-4 L O2 but no evidence of SOB with activity   Recommendations for follow up therapy are one component of a multi-disciplinary discharge planning process, led by the attending physician.  Recommendations may be updated based on patient status, additional functional criteria and insurance authorization.    Follow Up Recommendations  Home health OT    Assistance Recommended at Discharge Intermittent Supervision/Assistance  Equipment Recommendations  Other (comment) (Rolling walker)    Recommendations for Other Services      Precautions / Restrictions Precautions Precautions: Fall;Other (comment) Precaution Comments: A-line, monitor HR/O2 (does not wear O2 at baseline) Restrictions Weight Bearing Restrictions: No       Mobility Bed Mobility Overal bed mobility: Needs Assistance Bed Mobility: Sit to Supine       Sit to supine: Min assist   General bed mobility comments: Min A to assist getting L LE back into  bed    Transfers Overall transfer level: Needs assistance Equipment used: Rolling walker (2 wheels) Transfers: Sit to/from Stand Sit to Stand: Min assist           General transfer comment: Min A to rise from [facility] with posterior bias noted (pt able to demo self correction of balance)     Balance Overall balance assessment: Needs assistance Sitting-balance support: No upper extremity supported;Feet supported Sitting balance-Leahy Scale: Fair     Standing balance support: Single extremity supported;During functional activity Standing balance-Leahy Scale: Fair Standing balance comment: fair static standing, benefits from UE support for dynamic tasks with noted posterior sway at times                           ADL either performed or assessed with clinical judgement   ADL Overall ADL's : Needs assistance/impaired     Grooming: Set up;Sitting;Wash/dry hands                   Toilet Transfer: Minimal assistance;BSC/3in1;Rolling walker (2 wheels) Toilet Transfer Details (indicate cue type and reason): to rise from [facility] (received on BSC), posterior bias Toileting- Clothing Manipulation and Hygiene: Min guard;Sit to/from stand Toileting - Clothing Manipulation Details (indicate cue type and reason): able to perform peri care in standing with min guard for safety due to posterior bias     Functional mobility during ADLs: Minimal assistance;+2 for safety/equipment;Rolling walker (2 wheels) General ADL Comments: Pt with improving pain levels, able to mobilize via RW with increased tolerance, still expected deficits with reaching B feet d/t abdominal incision. Reinforced family assist  with LB dressing, use of shower chair for energy conservation at home    Extremity/Trunk Assessment Upper Extremity Assessment Upper Extremity Assessment: Generalized weakness   Lower Extremity Assessment Lower Extremity Assessment: Defer to PT evaluation        Vision   Vision  Assessment?: No apparent visual deficits   Perception     Praxis      Cognition Arousal/Alertness: Awake/alert Behavior During Therapy: WFL for tasks assessed/performed Overall Cognitive Status: Within Functional Limits for tasks assessed                                            Exercises     Shoulder Instructions       General Comments Pt husband present, supportive    Pertinent Vitals/ Pain       Pain Assessment: Faces Faces Pain Scale: Hurts little more Pain Location: abdomen, increased pain with coughing Pain Descriptors / Indicators: Grimacing;Guarding Pain Intervention(s): Monitored during session;Limited activity within patient's tolerance;PCA encouraged  Home Living Family/patient expects to be discharged to:: Private residence Living Arrangements: Spouse/significant other                                      Prior Functioning/Environment              Frequency  Min 2X/week        Progress Toward Goals  OT Goals(current goals can now be found in the care plan section)  Progress towards OT goals: Progressing toward goals  Acute Rehab OT Goals Patient Stated Goal: continued pain control, continue improvements OT Goal Formulation: With patient Time For Goal Achievement: 10/20/21 Potential to Achieve Goals: Good ADL Goals Pt Will Perform Grooming: with set-up;standing Pt Will Perform Lower Body Bathing: with min assist;sit to/from stand;sitting/lateral leans;with adaptive equipment Pt Will Perform Lower Body Dressing: with min assist;sitting/lateral leans;sit to/from stand;with adaptive equipment Pt Will Transfer to Toilet: with supervision;ambulating  Plan Discharge plan remains appropriate    Co-evaluation                 AM-PAC OT "6 Clicks" Daily Activity     Outcome Measure   Help from another person eating meals?: None Help from another person taking care of personal grooming?: A Little Help  from another person toileting, which includes using toliet, bedpan, or urinal?: A Little Help from another person bathing (including washing, rinsing, drying)?: A Lot Help from another person to put on and taking off regular upper body clothing?: A Little Help from another person to put on and taking off regular lower body clothing?: A Lot 6 Click Score: 17    End of Session Equipment Utilized During Treatment: Gait belt;Rolling walker (2 wheels);Oxygen  OT Visit Diagnosis: Unsteadiness on feet (R26.81);Other abnormalities of gait and mobility (R26.89);Muscle weakness (generalized) (M62.81);Pain Pain - part of body:  (abdomen)   Activity Tolerance Patient tolerated treatment well   Patient Left in bed;with call bell/phone within reach;with family/visitor present   Nurse Communication          Time: 4920-1007 OT Time Calculation (min): 26 min  Charges: OT General Charges $OT Visit: 1 Visit OT Treatments $Self Care/Home Management : 8-22 mins  Malachy Chamber, OTR/L Acute Rehab Services Office: [phone]   Layla Maw 10/07/2021, 12:24 PM

## 2021-10-07 NOTE — Progress Notes (Signed)
Patient arrived from [facility] to 4E25, patient with PCA pump infusing and oxygen at 5L [place], patient placed on monitor box MX40 -25 and CCMD made aware. Vital signs obtained. Patient with mid abdominal incision and Bilateral groins clean and intact. Patient primary RN at bedside. Call bell with in reach. Laurene Melendrez, Bettina Gavia rN

## 2021-10-07 NOTE — Progress Notes (Addendum)
Progress Note    10/07/2021 8:22 AM 2 Days Post-Op  Subjective:  sitting up in chair. Says she is feeling better this morning. Not passing any Flatus   Vitals:   10/07/21 0800 10/07/21 0806  BP:    Pulse:    Resp:  14  Temp: 97.8 F (36.6 C)   SpO2:  100%   Physical Exam: Cardiac:  tachy Lungs:  non labored, 3L [place] Incisions:  laparotomy and bilateral groins intact and well appearing Extremities:  well perfused and warm. Brisk right AT/ PT/ pero signals.Right DP palpable. Left PT/ [place] signals. Motor and sensation intact Abdomen:  expected tenderness along incision, non distended, soft Neurologic: alert and oriented  CBC    Component Value Date/Time   WBC 14.4 (H) 10/07/2021 0334   RBC 2.86 (L) 10/07/2021 0334   HGB 9.4 (L) 10/07/2021 0334   HGB 13.2 08/01/2021 1048   HCT 28.4 (L) 10/07/2021 0334   HCT 38.5 08/01/2021 1048   PLT 102 (L) 10/07/2021 0334   PLT 219 08/01/2021 1048   MCV 99.3 10/07/2021 0334   MCV 97 08/01/2021 1048   MCH 32.9 10/07/2021 0334   MCHC 33.1 10/07/2021 0334   RDW 13.7 10/07/2021 0334   RDW 13.2 08/01/2021 1048   LYMPHSABS 1.7 10/16/2018 1406   MONOABS 0.6 10/16/2018 1406   EOSABS 0.0 10/16/2018 1406   BASOSABS 0.1 10/16/2018 1406    BMET    Component Value Date/Time   NA 134 (L) 10/07/2021 0334   NA 141 08/01/2021 1048   K 4.4 10/07/2021 0334   CL 102 10/07/2021 0334   CO2 27 10/07/2021 0334   GLUCOSE 106 (H) 10/07/2021 0334   BUN 9 10/07/2021 0334   BUN 11 08/01/2021 1048   CREATININE 0.67 10/07/2021 0334   CALCIUM 8.1 (L) 10/07/2021 0334   GFRNONAA >60 10/07/2021 0334   GFRAA >60 11/07/2015 0219    INR    Component Value Date/Time   INR 1.0 10/05/2021 1506     Intake/Output Summary (Last 24 hours) at 10/07/2021 1607 Last data filed at 10/07/2021 0500 Gross per 24 hour  Intake --  Output 865 ml  Net -865 ml     Assessment/Plan:  55 y.o. female is s/p aortobifemoral bypass 2 Days Post-Op   General: well  appearing, not in any acute distress. PCA for pain management Vascular: lower extremities are well perfused and warm. Palpable right DP/ doppler PT. Doppler left PT/ pero signals. Feet warm. Motor and sensation intact Incisions: B groins and laparotomy incisions are intact and well appearing, no swelling or hematoma Cardiac: hemodynamically stable, remains tachy Pulmonary: breathing improved, now on 3L O2 [place]. Encourage IS Renal: Normal BUN/ Cr. Scr stable. Good UOP GI: No flatus or BM. Keep NPO. Wait on return of bowel function. Ice chips okay Heme: Hgb down a little today 9.4. Overall stable will continue to follow with daily labs Neuro: Intact  OOB to chair. Mobilize today. PT/ OT recommending [place] services Possibly can start to advance diet later today to full liquids Will hopefully be able to start weaning off PCA over 24 hours   Karoline Caldwell, [place] [facility] [phone] 10/07/2021 8:22 AM  VASCULAR STAFF ADDENDUM: I have independently interviewed and examined the patient. I agree with the above.  CNS: PCA pain control. PT / OT / OOB / Ambulate.  CV: Persistent sinus tach. Will start metoprolol 79m IV Q6H. Lisinopril started yesterday for HTN.  Pulm: pulmonary hygiene: IS / OOB.  GI: NPO except for sips. Await return of bowel function. FEN/GU: Good urine output. Voiding trial. Start MIVF at 57m/h.  Heme: Anemia - likely dilutional and blood loss from surgery. No need for transfusion. Daily CBC. ID: Persistent leukocytosis. Afebrile. Sinus tach. Monitor. No need for antibiotics.  Endo: New Dx of DM (A1c 7). Start SSI. BG goal 120-180. Prophy: SQH. SCDs.   Stable for transfer to 4E.   TYevonne Aline HStanford Breed MD [facility]Phone Number: [phone]/12/2020 8:43 AM

## 2021-10-07 NOTE — Plan of Care (Signed)

## 2021-10-07 NOTE — TOC Initial Note (Signed)
Transition of Care [facility]) - Initial/Assessment Note    Patient Details  Name: Melissa Chaney MRN: [id number] Date of Birth: September 03, 1966  [facility] CM/SW Contact:    Bethena Roys, RN Phone Number: 10/07/2021, 4:20 PM  Clinical Narrative:  Case Manager spoke with the patient and spouse regarding home health physical therapy services. Prior to arrival patient was from home with spouse. Spouse states he works; however, family will be able to support the patient in the home while he is at work. Patient and spouse did not have a preference for the home health agency-[facility] is in network and the family is agreeable to services with them. [facility] can accept the patient and the family will receive a call within 24-48 hours post transition home. Patient will need HH PT orders and F2F  along with DME RW orders once stable. Case Manager  will continue to follow for additional transition of care needs as the patient progresses.    Expected Discharge Plan: [place] Barriers to Discharge: Continued Medical Work up   Patient Goals and CMS Choice Patient states their goals for this hospitalization and ongoing recovery are:: to return home once stable.   Choice offered to / list presented to :  (Family did not have a preference-[facility] is in network.)  Expected Discharge Plan and Services Expected Discharge Plan: [place] In-house Referral: NA Discharge Planning Services: CM Consult Post Acute Care Choice: [place] arrangements for the past 2 months: Single Family Home                 DME Arranged: Walker rolling (Needs orders.)         HH Arranged: PT HH Agency: [facility] Date [place]: 10/07/21 Time [place]: 1619 Representative spoke with at [place]: [place]  Prior Living Arrangements/Services Living arrangements for the past 2 months: [place] with::  Spouse Patient language and need for interpreter reviewed:: Yes Do you feel safe going back to the place where you live?: Yes      Need for Family Participation in Patient Care: Yes (Comment) Care giver support system in place?: Yes (comment)   Criminal Activity/Legal Involvement Pertinent to Current Situation/Hospitalization: No - Comment as needed  Activities of Daily Living Home Assistive Devices/Equipment: Eyeglasses, Dentures (specify type), Scales, Cane (specify quad or straight), Grab bars around toilet, Grab bars in shower ADL Screening (condition at time of admission) Patient's cognitive ability adequate to safely complete daily activities?: Yes Is the patient deaf or have difficulty hearing?: No Does the patient have difficulty seeing, even when wearing glasses/contacts?: No Does the patient have difficulty concentrating, remembering, or making decisions?: No Patient able to express need for assistance with ADLs?: Yes Does the patient have difficulty dressing or bathing?: No Independently performs ADLs?: Yes (appropriate for developmental age) Does the patient have difficulty walking or climbing stairs?: Yes Weakness of Legs: Both Weakness of Arms/Hands: None  Permission Sought/Granted Permission sought to share information with : Family Supports, Customer service manager, Case Optician, dispensing granted to share information with : Yes, Verbal Permission Granted     Permission granted to share info w AGENCY: [facility]        Emotional Assessment Appearance:: Appears stated age Attitude/Demeanor/Rapport: Engaged Affect (typically observed): Appropriate Orientation: : Oriented to Situation, Oriented to  Time, Oriented to Place, Oriented to Self Alcohol / Substance Use: Not Applicable Psych Involvement: No (  comment)  Admission diagnosis:  Status post aortobifemoral bypass surgery [Z95.828] Aortoiliac occlusive disease ([place]) [I74.09] Patient Active  Problem List   Diagnosis Date Noted   Status post aortobifemoral bypass surgery 10/05/2021   Aortoiliac occlusive disease ([place]) 10/05/2021   Atypical chest pain 09/21/2021   Pre-op evaluation 09/21/2021   Hyperlipidemia LDL goal <70 09/21/2021   Encounter for Papanicolaou smear for cervical cancer screening 09/09/2021   Adjustment disorder 08/01/2021   Tobacco abuse 08/01/2021   Severe peripheral arterial disease ([place]) 08/01/2021   Left hip pain 08/01/2021   COLONIC POLYPS 09/10/2008   Crohn's disease ([place]) 09/10/2008   PCP:  Lenoria Chime, MD Pharmacy:   [id number] [place] [place] [address]Phone: [phone]: [phone] [place]Phone: [phone]: [address] [address] [place]Phone: [phone]: [address] [address]Phone: [phone]: [phone] Readmission Risk Interventions No flowsheet data found.

## 2021-10-07 NOTE — Progress Notes (Signed)
Physical Therapy Treatment Patient Details Name: [name] MRN: [id number] DOB: [birth year] Today's Date: 10/07/2021   History of Present Illness Melissa Chaney is a 55 y.o. female with progressive bilateral leg pain due to  aortoiliac occlusive disease with claudication. Pt elected to undergo aortobifemoral bypass grafting on 11/30. PMH: arthritis, Crohn's Disease, depression and PAD.    PT Comments    The pt was able to make great progress with OOB mobility and activity tolerance this session. Vitals remained stable and the pt did not endorse any further nausea even with significant increase in hallway ambulation distance at this time. The pt continues to benefit from use of RW due to mild instability and required minA with gait due to intermittent posterior lean. She will continue to benefit from skilled PT acutely to progress dynamic stability and complete stair training prior to return home.     Recommendations for follow up therapy are one component of a multi-disciplinary discharge planning process, led by the attending physician.  Recommendations may be updated based on patient status, additional functional criteria and insurance authorization.  Follow Up Recommendations  Home health PT     Assistance Recommended at Discharge Intermittent Supervision/Assistance  Equipment Recommendations  Rolling walker (2 wheels)    Recommendations for Other Services       Precautions / Restrictions Precautions Precautions: Fall;Other (comment) Precaution Comments: PCA, A-line, monitor HR/O2 (does not wear O2 at baseline) Restrictions Weight Bearing Restrictions: No     Mobility  Bed Mobility Overal bed mobility: Needs Assistance Bed Mobility: Sit to Supine       Sit to supine: Min assist   General bed mobility comments: Min A to assist getting L LE back into bed    Transfers Overall transfer level: Needs assistance Equipment used: Rolling walker (2 wheels) Transfers: Sit  to/from Stand Sit to Stand: Min assist           General transfer comment: Min A to rise from [facility] with posterior bias noted (pt able to demo self correction of balance)    Ambulation/Gait Ambulation/Gait assistance: Min assist;+2 safety/equipment Gait Distance (Feet): 100 Feet Assistive device: Rolling walker (2 wheels) Gait Pattern/deviations: Step-to pattern;Decreased stride length;Trunk flexed;Narrow base of support Gait velocity: decreased Gait velocity interpretation: <1.31 ft/sec, indicative of household ambulator   General Gait Details: pt with slight trunk flexion but improved from yesterday. no reliable SpO2 reading with gait but pt reports no SOB. no overt LOB     Balance Overall balance assessment: Needs assistance Sitting-balance support: No upper extremity supported;Feet supported Sitting balance-Leahy Scale: Fair     Standing balance support: Single extremity supported;During functional activity Standing balance-Leahy Scale: Fair Standing balance comment: fair static standing, benefits from UE support for dynamic tasks with noted posterior sway at times                            Cognition Arousal/Alertness: Awake/alert Behavior During Therapy: WFL for tasks assessed/performed Overall Cognitive Status: Within Functional Limits for tasks assessed                                          Exercises      General Comments General comments (skin integrity, edema, etc.): pt spouse present and supportive      Pertinent Vitals/Pain Pain Assessment: Faces Faces Pain Scale: Hurts little more  Pain Location: abdomen, increased pain with coughing Pain Descriptors / Indicators: Grimacing;Guarding Pain Intervention(s): Limited activity within patient's tolerance;Monitored during session;Repositioned;PCA encouraged     PT Goals (current goals can now be found in the care plan section) Acute Rehab PT Goals Patient Stated Goal: get back  to gardening PT Goal Formulation: With patient Time For Goal Achievement: 10/20/21 Potential to Achieve Goals: Good Progress towards PT goals: Progressing toward goals    Frequency    Min 3X/week      PT Plan Current plan remains appropriate    Co-evaluation PT/OT/SLP Co-Evaluation/Treatment: Yes Reason for Co-Treatment: For patient/therapist safety;To address functional/ADL transfers;Other (comment) (poor activity tolerance at initial eval) PT goals addressed during session: Balance;Mobility/safety with mobility;Proper use of DME        AM-PAC PT "6 Clicks" Mobility   Outcome Measure  Help needed turning from your back to your side while in a flat bed without using bedrails?: A Little Help needed moving from lying on your back to sitting on the side of a flat bed without using bedrails?: A Little Help needed moving to and from a bed to a chair (including a wheelchair)?: A Little Help needed standing up from a chair using your arms (e.g., wheelchair or bedside chair)?: A Little Help needed to walk in hospital room?: A Little Help needed climbing 3-5 steps with a railing? : A Lot 6 Click Score: 17    End of Session Equipment Utilized During Treatment: Gait belt;Oxygen Activity Tolerance: Patient tolerated treatment well Patient left: in bed;with call bell/phone within reach;with family/visitor present Nurse Communication: Mobility status PT Visit Diagnosis: Other abnormalities of gait and mobility (R26.89);Unsteadiness on feet (R26.81);Pain     Time: 0786-7544 PT Time Calculation (min) (ACUTE ONLY): 26 min  Charges:  $Gait Training: 8-22 mins                     [place] Carbo, PT, DPT   Acute Rehabilitation Department Pager #: [phone]   Sandra Cockayne 10/07/2021, 3:41 PM

## 2021-10-08 LAB — CBC
HCT: 23.6 % — ABNORMAL LOW (ref 36.0–46.0)
Hemoglobin: 7.8 g/dL — ABNORMAL LOW (ref 12.0–15.0)
MCH: 33.1 pg (ref 26.0–34.0)
MCHC: 33.1 g/dL (ref 30.0–36.0)
MCV: 100 fL (ref 80.0–100.0)
Platelets: 91 10*3/uL — ABNORMAL LOW (ref 150–400)
RBC: 2.36 MIL/uL — ABNORMAL LOW (ref 3.87–5.11)
RDW: 13.7 % (ref 11.5–15.5)
WBC: 9.2 10*3/uL (ref 4.0–10.5)
nRBC: 0 % (ref 0.0–0.2)

## 2021-10-08 LAB — BASIC METABOLIC PANEL
Anion gap: 3 — ABNORMAL LOW (ref 5–15)
BUN: 5 mg/dL — ABNORMAL LOW (ref 6–20)
CO2: 29 mmol/L (ref 22–32)
Calcium: 8 mg/dL — ABNORMAL LOW (ref 8.9–10.3)
Chloride: 101 mmol/L (ref 98–111)
Creatinine, Ser: 0.58 mg/dL (ref 0.44–1.00)
GFR, Estimated: >60 mL/min (ref >60)
Glucose, Bld: 142 mg/dL — ABNORMAL HIGH (ref 70–99)
Potassium: 4.4 mmol/L (ref 3.5–5.1)
Sodium: 133 mmol/L — ABNORMAL LOW (ref 135–145)

## 2021-10-08 LAB — GLUCOSE, CAPILLARY
Glucose-Capillary: 181 mg/dL — ABNORMAL HIGH (ref 70–99)
Glucose-Capillary: 150 mg/dL — ABNORMAL HIGH (ref 70–99)
Glucose-Capillary: 138 mg/dL — ABNORMAL HIGH (ref 70–99)
Glucose-Capillary: 124 mg/dL — ABNORMAL HIGH (ref 70–99)

## 2021-10-08 MED ORDER — SALINE SPRAY 0.65 % NA SOLN
1.0000 | NASAL | Status: DC | PRN
  Filled 2021-10-08: qty 44

## 2021-10-08 MED ORDER — HEPARIN SODIUM (PORCINE) 5000 UNIT/ML IJ SOLN
5000.0000 [IU] | Freq: Three times a day (TID) | INTRAMUSCULAR | Status: DC
  Administered 2021-10-08 – 2021-10-10 (×6): 5000 [IU] via SUBCUTANEOUS
  Filled 2021-10-08 (×6): qty 1

## 2021-10-08 MED ORDER — BISACODYL 10 MG RE SUPP
10.0000 mg | Freq: Once | RECTAL | Status: AC
  Administered 2021-10-08: 10 mg via RECTAL
  Filled 2021-10-08: qty 1

## 2021-10-08 NOTE — Progress Notes (Signed)
Morphine syringe for PCA pump replaced.  Old syringe had 0 ml left, empty syringe disposed in sharps container and witnessed by [employer].

## 2021-10-08 NOTE — Discharge Instructions (Signed)
[facility]  Discharge Instructions   Open Aortic Surgery  Please refer to the following instructions for your post-procedure care. Your surgeon or Physician Assistant will discuss any changes with you.  Activity  Avoid lifting more than eight pounds (a gallon of milk) until after your first post-operative visit. You are encouraged to walk as much as you can. You can slowly return to normal activities but must avoid strenuous activity and heavy lifting until your doctor tells you it's okay. Heavy lifting can hurt the incision and cause a hernia. Avoid activities such as vacuuming or swinging a golf club. It is normal to feel tired for several weeks after your surgery. Do not drive until your doctor gives the okay and you are no longer taking prescription pain medications. It is also normal to have difficulty with sleep habits, eating and bowl movements after surgery. These will go away with time.  Bathing/Showering  Shower daily after you go home. Do not soak in a bathtub, hot tub, or swim until the incision heals.  Incision Care  Shower every day. Clean your incision with mild soap and water. Pat the area dry with a clean towel. You do not need a bandage unless otherwise instructed. Do not apply any ointments or creams to your incision. You may have skin glue on your incision. Do not peel it off. It will come off on its own in about one week. If you have staples or sutures along your incision, they will be removed at your post op appointment.  If you have groin incisions, wash the groin wounds with soap and water daily and pat dry. (No tub bath-only shower)  Then put a dry gauze or washcloth in the groin to keep this area dry to help prevent wound infection.  Do this daily and as needed.  Do not use Vaseline or neosporin on your incisions.  Only use soap and water on your incisions and then protect and keep dry.  Diet  Resume your normal diet. There are no  special food restriction following this procedure. A low fat/low cholesterol diet is recommended for all patients with vascular disease. After your aortic surgery, it's normal to feel full faster than usual and to not feel as hungry as you normally would. You will probably lose weight initially following your surgery. It's best to eat small, frequent meals over the course of the day. Call the office if you find that you are unable to eat even small meals.   In order to heal from your surgery, it is CRITICAL to get adequate nutrition. Your body requires vitamins, minerals, and protein. Vegetables are the best source of vitamins and minerals. If you have pain, you may take over-the-counter pain reliever such as acetaminophen (Tylenol). If you were prescribed a stronger pain medication, please be aware these medication can cause nausea and constipation. Prevent nausea by taking the medication with a snack or meal. Avoid constipation by drinking plenty of fluids and eating foods with a high amount of fiber, such as fruits, vegetables and grains. Take 152m of the over-the-counter stool softener Colace twice a day as needed to help with constipation. A laxative, such as Milk of Magnesia, may be recommended for you at this time. Do not take a laxative unless your surgeon or P.A. tells you it's OK.  Do not take Tylenol if you are taking stronger pain medications (such as Percocet).  Follow Up  Our office will schedule a follow up  appointment 2-3 weeks after discharge.  Please call us immediately for any of the following conditions    .     Severe or worsening pain in your legs or feet or in your abdomen back or chest. Increased pain, redness drainage (pus) from your incision site. Increased abdominal pain, bloating, nausea, vomiting, or persistent diarrhea. Fever of 101 degrees or higher. Swelling in your leg (s).  Reduce your risk of vascular disease  Stop smoking. If you would like help, call  [facility] at 1-800-QUIT-NOW 717-371-0368) or [place] at ([phone] 738-2841. Manage your cholesterol Maintain a desired weight Control your diabetes Keep your blood pressure down  If you have any questions please call the office at [phone].

## 2021-10-08 NOTE — Progress Notes (Addendum)
Progress Note    10/08/2021 7:48 AM 3 Days Post-Op  Subjective:  sitting in chair eating breakfast (grits).  Says she passed a little flatus last night but still no BM.  Had a little nausea yesterday with getting up but this has gotten better.  No nausea with eating.  Says her feet feel much better.  Left still a little "tingly" but better.   Afebrile HR 15'Q-008'Q 761'P systolic   Vitals:   50/93/26 0011 10/08/21 0412  BP: (!) 110/43 (!) 111/51  Pulse: 84 73  Resp: 20 13  Temp: 97.9 F (36.6 C) 97.9 F (36.6 C)  SpO2: 97% 92%    Physical Exam: Cardiac:  regular Lungs:  non labored Incisions:  laparotomy and bilateral groin incisions are healing nicely Extremities:  bilateral feet are warm and well perfused with motor and sensory in tact.  Abdomen:  soft, NT; occasional bowel sounds  CBC    Component Value Date/Time   WBC 9.2 10/08/2021 0410   RBC 2.36 (L) 10/08/2021 0410   HGB 7.8 (L) 10/08/2021 0410   HGB 13.2 08/01/2021 1048   HCT 23.6 (L) 10/08/2021 0410   HCT 38.5 08/01/2021 1048   PLT 91 (L) 10/08/2021 0410   PLT 219 08/01/2021 1048   MCV 100.0 10/08/2021 0410   MCV 97 08/01/2021 1048   MCH 33.1 10/08/2021 0410   MCHC 33.1 10/08/2021 0410   RDW 13.7 10/08/2021 0410   RDW 13.2 08/01/2021 1048   LYMPHSABS 1.7 10/16/2018 1406   MONOABS 0.6 10/16/2018 1406   EOSABS 0.0 10/16/2018 1406   BASOSABS 0.1 10/16/2018 1406    BMET    Component Value Date/Time   NA 133 (L) 10/08/2021 0410   NA 141 08/01/2021 1048   K 4.4 10/08/2021 0410   CL 101 10/08/2021 0410   CO2 29 10/08/2021 0410   GLUCOSE 142 (H) 10/08/2021 0410   BUN 5 (L) 10/08/2021 0410   BUN 11 08/01/2021 1048   CREATININE 0.58 10/08/2021 0410   CALCIUM 8.0 (L) 10/08/2021 0410   GFRNONAA >60 10/08/2021 0410   GFRAA >60 11/07/2015 0219    INR    Component Value Date/Time   INR 1.0 10/05/2021 1506     Intake/Output Summary (Last 24 hours) at 10/08/2021 0748 Last data filed at  10/08/2021 0615 Gross per 24 hour  Intake 2577.22 ml  Output 1050 ml  Net 1527.22 ml     Assessment/Plan:  55 y.o. female is s/p:  aortobifemoral bypass grafting  3 Days Post-Op   -pt doing well as feet are warm and well perfused. -still no BM but did pass some flatus last evening and was started on a clear liquid diet and she is tolerating this.  She did have a stool softener yesterday.  Will give dulcolax this am.   -acute blood loss anemia-pt tolerating.  No transfusion currently -PT/OT recommending HHPT/OT and RW.  Will put in face to face and [place] orders.  -DVT prophylaxis:  pt with drop in hemoglobin but probably somewhat due to diluational effects.  Pt asymptomatic.  Will start sq heparin today  -continue mobilizing. -discussed importance of smoking cessation with pt. -PDMP reviewed.   Leontine Locket, PA-C [facility] [phone] 10/08/2021 7:48 AM  I have interviewed the patient and examined the patient. I agree with the findings by the PA.  She is tolerating her diet.  Her incisions all look fine.  He has a palpable right dorsalis pedis pulse and good Doppler flow  in the left foot in the posterior tibial position.  Anticipate discharge in the next 1 to 2 days once her bowels are working.  Gae Gallop, MD 9:48 AM   Gae Gallop, MD

## 2021-10-09 ENCOUNTER — Inpatient Hospital Stay (HOSPITAL_COMMUNITY): Payer: 59

## 2021-10-09 LAB — GLUCOSE, CAPILLARY
Glucose-Capillary: 163 mg/dL — ABNORMAL HIGH (ref 70–99)
Glucose-Capillary: 117 mg/dL — ABNORMAL HIGH (ref 70–99)
Glucose-Capillary: 90 mg/dL (ref 70–99)
Glucose-Capillary: 131 mg/dL — ABNORMAL HIGH (ref 70–99)

## 2021-10-09 LAB — CBC
HCT: 23.2 % — ABNORMAL LOW (ref 36.0–46.0)
Hemoglobin: 7.8 g/dL — ABNORMAL LOW (ref 12.0–15.0)
MCH: 33.5 pg (ref 26.0–34.0)
MCHC: 33.6 g/dL (ref 30.0–36.0)
MCV: 99.6 fL (ref 80.0–100.0)
Platelets: 118 10*3/uL — ABNORMAL LOW (ref 150–400)
RBC: 2.33 MIL/uL — ABNORMAL LOW (ref 3.87–5.11)
RDW: 13.5 % (ref 11.5–15.5)
WBC: 7.9 10*3/uL (ref 4.0–10.5)
nRBC: 0 % (ref 0.0–0.2)

## 2021-10-09 LAB — BASIC METABOLIC PANEL
Anion gap: 3 — ABNORMAL LOW (ref 5–15)
BUN: <5 mg/dL — ABNORMAL LOW (ref 6–20)
CO2: 30 mmol/L (ref 22–32)
Calcium: 7.8 mg/dL — ABNORMAL LOW (ref 8.9–10.3)
Chloride: 100 mmol/L (ref 98–111)
Creatinine, Ser: 0.46 mg/dL (ref 0.44–1.00)
GFR, Estimated: >60 mL/min (ref >60)
Glucose, Bld: 136 mg/dL — ABNORMAL HIGH (ref 70–99)
Potassium: 4.3 mmol/L (ref 3.5–5.1)
Sodium: 133 mmol/L — ABNORMAL LOW (ref 135–145)

## 2021-10-09 MED ORDER — ATORVASTATIN CALCIUM 40 MG PO TABS
40.0000 mg | ORAL_TABLET | Freq: Every day | ORAL | Status: DC
  Administered 2021-10-09 – 2021-10-10 (×2): 40 mg via ORAL
  Filled 2021-10-09 (×2): qty 1

## 2021-10-09 MED ORDER — ORAL CARE MOUTH RINSE
15.0000 mL | Freq: Two times a day (BID) | OROMUCOSAL | Status: DC
  Administered 2021-10-09 – 2021-10-10 (×3): 15 mL via OROMUCOSAL

## 2021-10-09 MED ORDER — BUPROPION HCL ER (XL) 150 MG PO TB24
150.0000 mg | ORAL_TABLET | Freq: Every day | ORAL | Status: DC
  Administered 2021-10-09 – 2021-10-10 (×2): 150 mg via ORAL
  Filled 2021-10-09 (×2): qty 1

## 2021-10-09 MED ORDER — OXYCODONE-ACETAMINOPHEN 5-325 MG PO TABS
1.0000 | ORAL_TABLET | ORAL | Status: DC | PRN
  Administered 2021-10-09 – 2021-10-10 (×2): 2 via ORAL
  Filled 2021-10-09 (×2): qty 2

## 2021-10-09 NOTE — Progress Notes (Addendum)
  Progress Note    10/09/2021 7:48 AM 4 Days Post-Op  Subjective:  says she had a couple of small BM's after suppository.  Tolerating diet.  No nausea/vomiting.  Afebrile 94% 12L HFNC (RN reports difficulty getting accurate O2 reading) HR 70's-100's  81'K-481'E systolic  Vitals:   56/31/49 0306 10/09/21 0350  BP: (!) 97/48   Pulse: 73   Resp: 12 14  Temp: (!) 97.5 F (36.4 C)   SpO2: 100% 100%    Physical Exam: Cardiac:  regular Lungs:  non labored Incisions:  all incisions are healing nicely.  Extremities:  palpable right DP and left PT.  Bilateral feet are warm. Abdomen:  soft, NT  CBC    Component Value Date/Time   WBC 7.9 10/09/2021 0445   RBC 2.33 (L) 10/09/2021 0445   HGB 7.8 (L) 10/09/2021 0445   HGB 13.2 08/01/2021 1048   HCT 23.2 (L) 10/09/2021 0445   HCT 38.5 08/01/2021 1048   PLT 118 (L) 10/09/2021 0445   PLT 219 08/01/2021 1048   MCV 99.6 10/09/2021 0445   MCV 97 08/01/2021 1048   MCH 33.5 10/09/2021 0445   MCHC 33.6 10/09/2021 0445   RDW 13.5 10/09/2021 0445   RDW 13.2 08/01/2021 1048   LYMPHSABS 1.7 10/16/2018 1406   MONOABS 0.6 10/16/2018 1406   EOSABS 0.0 10/16/2018 1406   BASOSABS 0.1 10/16/2018 1406    BMET    Component Value Date/Time   NA 133 (L) 10/09/2021 0445   NA 141 08/01/2021 1048   K 4.3 10/09/2021 0445   CL 100 10/09/2021 0445   CO2 30 10/09/2021 0445   GLUCOSE 136 (H) 10/09/2021 0445   BUN <5 (L) 10/09/2021 0445   BUN 11 08/01/2021 1048   CREATININE 0.46 10/09/2021 0445   CALCIUM 7.8 (L) 10/09/2021 0445   GFRNONAA >60 10/09/2021 0445   GFRAA >60 11/07/2015 0219    INR    Component Value Date/Time   INR 1.0 10/05/2021 1506     Intake/Output Summary (Last 24 hours) at 10/09/2021 0748 Last data filed at 10/09/2021 0515 Gross per 24 hour  Intake 2181.9 ml  Output 2000 ml  Net 181.9 ml     Assessment/Plan:  55 y.o. female is s/p:  ABF bypass  4 Days Post-Op   -pt doing well and now tolerating diet.  Will  advance diet. -Will wean PCA and start po pain medication.  Also restarted statin.   -given high O2 requirements, will get 2 view cxr this morning.  She does not have any fevers.  -needs to mobilize and walk in hallways. -anticipate dc in the next couple of days.  May need home O2.  -dc central line as she does have other IV access in place   Leontine Locket, [place] [facility] [phone] 10/09/2021 7:48 AM  I have interviewed the patient and examined the patient. I agree with the findings by the PA.  Palpable right dorsalis pedis pulse.  The left foot is hyperemic.  Wean O2.  Now on regular diet.  Anticipate discharge once she is off oxygen. CXR not read yet, but it looks like she has a right pleural effusion with ATx. Encourage IS. D/C IVF. 40 Lasix IV. Check BMET in AM.  Gae Gallop, MD

## 2021-10-09 NOTE — Progress Notes (Signed)
PCA pump discontinued.  19 mg/ml of morphine wasted in stericycle with Lauren RN as witness.  Documented in [place].

## 2021-10-10 LAB — GLUCOSE, CAPILLARY: Glucose-Capillary: 108 mg/dL — ABNORMAL HIGH (ref 70–99)

## 2021-10-10 LAB — CBC
HCT: 27.4 % — ABNORMAL LOW (ref 36.0–46.0)
Hemoglobin: 9 g/dL — ABNORMAL LOW (ref 12.0–15.0)
MCH: 32.7 pg (ref 26.0–34.0)
MCHC: 32.8 g/dL (ref 30.0–36.0)
MCV: 99.6 fL (ref 80.0–100.0)
Platelets: 167 10*3/uL (ref 150–400)
RBC: 2.75 MIL/uL — ABNORMAL LOW (ref 3.87–5.11)
RDW: 13.7 % (ref 11.5–15.5)
WBC: 7.8 10*3/uL (ref 4.0–10.5)
nRBC: 0 % (ref 0.0–0.2)

## 2021-10-10 LAB — BASIC METABOLIC PANEL
Anion gap: 8 (ref 5–15)
BUN: 5 mg/dL — ABNORMAL LOW (ref 6–20)
CO2: 25 mmol/L (ref 22–32)
Calcium: 8.3 mg/dL — ABNORMAL LOW (ref 8.9–10.3)
Chloride: 99 mmol/L (ref 98–111)
Creatinine, Ser: 0.59 mg/dL (ref 0.44–1.00)
GFR, Estimated: >60 mL/min (ref >60)
Glucose, Bld: 112 mg/dL — ABNORMAL HIGH (ref 70–99)
Potassium: 3.9 mmol/L (ref 3.5–5.1)
Sodium: 132 mmol/L — ABNORMAL LOW (ref 135–145)

## 2021-10-10 MED ORDER — OXYCODONE-ACETAMINOPHEN 5-325 MG PO TABS: 1.0000 | ORAL_TABLET | Freq: Four times a day (QID) | ORAL | 0 refills | Status: DC | PRN

## 2021-10-10 NOTE — Progress Notes (Addendum)
  Progress Note    10/10/2021 7:34 AM 5 Days Post-Op  Subjective:  Ready for d/c home   Vitals:   10/09/21 2354 10/10/21 0356  BP: (!) 134/46 (!) 120/54  Pulse: 70 84  Resp: 14 18  Temp: 98.1 F (36.7 C) 98 F (36.7 C)  SpO2: 91% 90%   Physical Exam Lungs:  non labored breathing Incisions:  abd and groin incisions c/d/i Extremities:  palpable R DP; L foot warm with motor and sensation intact Abdomen:  soft, NT, ND Neurologic: A&O  CBC    Component Value Date/Time   WBC 7.8 10/10/2021 0122   RBC 2.75 (L) 10/10/2021 0122   HGB 9.0 (L) 10/10/2021 0122   HGB 13.2 08/01/2021 1048   HCT 27.4 (L) 10/10/2021 0122   HCT 38.5 08/01/2021 1048   PLT 167 10/10/2021 0122   PLT 219 08/01/2021 1048   MCV 99.6 10/10/2021 0122   MCV 97 08/01/2021 1048   MCH 32.7 10/10/2021 0122   MCHC 32.8 10/10/2021 0122   RDW 13.7 10/10/2021 0122   RDW 13.2 08/01/2021 1048   LYMPHSABS 1.7 10/16/2018 1406   MONOABS 0.6 10/16/2018 1406   EOSABS 0.0 10/16/2018 1406   BASOSABS 0.1 10/16/2018 1406    BMET    Component Value Date/Time   NA 132 (L) 10/10/2021 0122   NA 141 08/01/2021 1048   K 3.9 10/10/2021 0122   CL 99 10/10/2021 0122   CO2 25 10/10/2021 0122   GLUCOSE 112 (H) 10/10/2021 0122   BUN 5 (L) 10/10/2021 0122   BUN 11 08/01/2021 1048   CREATININE 0.59 10/10/2021 0122   CALCIUM 8.3 (L) 10/10/2021 0122   GFRNONAA >60 10/10/2021 0122   GFRAA >60 11/07/2015 0219    INR    Component Value Date/Time   INR 1.0 10/05/2021 1506     Intake/Output Summary (Last 24 hours) at 10/10/2021 0734 Last data filed at 10/09/2021 2140 Gross per 24 hour  Intake 600 ml  Output 300 ml  Net 300 ml     Assessment/Plan:  55 y.o. female is s/p ABF bypass 5 Days Post-Op   BLE well perfused Abd and groin incisions healing well Tolerating regular diet with several BMs yesterday Weaned from O2, subjectively breathing better after lasix yesterday; CXR showing atelectasis; WBC wnl TOC  arranged HH PT Likely home today   Dagoberto Ligas, PA-C [facility] [phone] 10/10/2021 7:34 AM  VASCULAR STAFF ADDENDUM: I have independently interviewed and examined the patient. I agree with the above.  Ready for home. Follow up in 4 weeks with me with ABI.  [name]. [name], MD [facility] Phone Number: [phone] 10/10/2021 9:07 AM

## 2021-10-10 NOTE — Plan of Care (Signed)
  Problem: Acute Rehab OT Goals (only OT should resolve) Goal: Pt. Will Perform Grooming Outcome: Adequate for Discharge Goal: Pt. Will Perform Lower Body Bathing Outcome: Adequate for Discharge Goal: Pt. Will Perform Lower Body Dressing Outcome: Adequate for Discharge Goal: Pt. Will Transfer To Toilet Outcome: Adequate for Discharge

## 2021-10-10 NOTE — Progress Notes (Signed)
Physical Therapy Treatment Patient Details Name: Melissa Chaney MRN: [id number] DOB: [birth year] Today's Date: 10/10/2021   History of Present Illness Melissa Chaney is a 55 y.o. female with progressive bilateral leg pain due to  aortoiliac occlusive disease with claudication. Pt elected to undergo aortobifemoral bypass grafting on 11/30. PMH: arthritis, Crohn's Disease, depression and PAD.    PT Comments    The pt was able to demo great progress with OOB mobility, transfers, hallway ambulation, and stair navigation this morning. She was able to complete hallway ambulation with good stability with use of RW and no physical assist. She did require minA to complete step-ups, but has good family support at home to provide this assist. The pt was educated on progressive walking program and verbalizes understanding. Is safe to return home with family assist at this time, will continue to benefit from skilled PT acutely and after d/c to further progress functional strength, power, and activity tolerance as pt is hopeful to return to ambulation without AD and gardening.     Recommendations for follow up therapy are one component of a multi-disciplinary discharge planning process, led by the attending physician.  Recommendations may be updated based on patient status, additional functional criteria and insurance authorization.  Follow Up Recommendations  Home health PT     Assistance Recommended at Discharge Intermittent Supervision/Assistance  Equipment Recommendations  Rolling walker (2 wheels)    Recommendations for Other Services       Precautions / Restrictions Precautions Precautions: Fall Precaution Comments: watch O2, stable on RA this session Restrictions Weight Bearing Restrictions: No     Mobility  Bed Mobility Overal bed mobility: Needs Assistance             General bed mobility comments: pt OOB in recliner at start and end of session    Transfers Overall transfer  level: Needs assistance Equipment used: Rolling walker (2 wheels) Transfers: Sit to/from Stand Sit to Stand: Min guard           General transfer comment: minG with RW, minA with HHA    Ambulation/Gait Ambulation/Gait assistance: Min guard Gait Distance (Feet): 200 Feet Assistive device: Rolling walker (2 wheels) Gait Pattern/deviations: Decreased stride length;Trunk flexed;Narrow base of support;Step-through pattern Gait velocity: 0.26 m/s Gait velocity interpretation: <1.31 ft/sec, indicative of household ambulator   General Gait Details: pt with small steps but stable with RW. improved trunk extension with cues   Stairs Stairs: Yes Stairs assistance: Min assist Stair Management: One rail Right;Step to pattern Number of Stairs: 2 General stair comments: HHA wiht minA. step-to educated on RLE leading as is less painful       Balance Overall balance assessment: Needs assistance Sitting-balance support: No upper extremity supported;Feet supported Sitting balance-Leahy Scale: Good     Standing balance support: Single extremity supported Standing balance-Leahy Scale: Fair Standing balance comment: static stance without UE support, single-bilateral UE support for gait                            Cognition Arousal/Alertness: Awake/alert Behavior During Therapy: WFL for tasks assessed/performed Overall Cognitive Status: Within Functional Limits for tasks assessed                                          Exercises      General Comments General comments (skin integrity,  edema, etc.): VSS On RA      Pertinent Vitals/Pain Pain Assessment: Faces Faces Pain Scale: Hurts a little bit Pain Location: abdomen, incisions Pain Descriptors / Indicators: Grimacing;Guarding Pain Intervention(s): Limited activity within patient's tolerance;Monitored during session;Repositioned     PT Goals (current goals can now be found in the care plan  section) Acute Rehab PT Goals Patient Stated Goal: get back to gardening PT Goal Formulation: With patient Time For Goal Achievement: 10/20/21 Potential to Achieve Goals: Good Progress towards PT goals: Progressing toward goals    Frequency    Min 3X/week      PT Plan Current plan remains appropriate       AM-PAC PT "6 Clicks" Mobility   Outcome Measure  Help needed turning from your back to your side while in a flat bed without using bedrails?: A Little Help needed moving from lying on your back to sitting on the side of a flat bed without using bedrails?: A Little Help needed moving to and from a bed to a chair (including a wheelchair)?: A Little Help needed standing up from a chair using your arms (e.g., wheelchair or bedside chair)?: A Little Help needed to walk in hospital room?: A Little Help needed climbing 3-5 steps with a railing? : A Little 6 Click Score: 18    End of Session Equipment Utilized During Treatment: Gait belt Activity Tolerance: Patient tolerated treatment well Patient left: with call bell/phone within reach;with family/visitor present;in chair Nurse Communication: Mobility status PT Visit Diagnosis: Other abnormalities of gait and mobility (R26.89);Unsteadiness on feet (R26.81);Pain     Time: 9485-4627 PT Time Calculation (min) (ACUTE ONLY): 16 min  Charges:  $Gait Training: 8-22 mins                     West Carbo, PT, DPT   Acute Rehabilitation Department Pager #: [phone]   Sandra Cockayne 10/10/2021, 10:34 AM

## 2021-10-10 NOTE — Progress Notes (Signed)
Discharge instructions (including medications) discussed with and copy provided to patient/caregiver 

## 2021-10-11 NOTE — Discharge Summary (Signed)
Open Aortic Surgery Discharge Summary    Melissa Chaney [birth year] 55 y.o. female  [id number]  Admission Date: 10/05/2021  Discharge Date: 10/10/2021 Physician: No att. providers found  Admission Diagnosis: Status post aortobifemoral bypass surgery [Z95.828] Aortoiliac occlusive disease ([place]) [I74.09]   HPI:   This is a 55 y.o. female who presents to clinic for evaluation of bilateral leg pain.  The patient reports this is been slowly progressive over the past year.  The patient reports fairly typical symptoms of disabling claudication in her thighs and calves.  She also has pain in her left foot that will occasionally awaken her at night.  She has no ulcers about her feet.  Hospital Course:  The patient was admitted to the hospital and taken to the operating room on 10/05/2021 and underwent: Aortobifemoral bypass grafting with lysis of adhesions on 10/05/2021.  Findings: Massive lysis of adhesions required to expose the retroperitoneum safely.  The immediate infrarenal aorta was fairly healthy and free of disease.  We exposed the left renal artery which we knew to be the lower artery and circumferentially controlled the aorta here.  The femoral arteries were exposed through transverse incisions.  Retroperitoneal tunnels were created in standard fashion using blunt digital dissection.  Proximal anastomosis was sewn end-to-end.  Distal anastomoses were sewn end to side.  Brisk Doppler flow was heard in the feet at completion.    The pt tolerated the procedure well and was transported to the PACU in good condition.   By POD 1, pt was hemodynamically stable and not requiring any pressor support.  She had good doppler signals in her feet.  Hgb stble.  She was kept npo until bowel function returns.   POD 2, she was doing well and diet advanced to full liquids.  She was transferred to [place].  POD 3, doing well and tolerating clear liquid diet.  No BM yet and passing occasional flatus.   She was given a dulcolax supp.  Importance of smoking cessation discussed with pt.    POD 4, pt doing well but still requiring O2.  She had a cxr that revealed right pleural effusion and atelectasis.  She is encouraged to continue her IS. She was given dose of diuretic.  She did have a BM after supp and diet was advanced.  IVF dc'd.   POD 5, pt doing well and was weaned off O2 and breathing better after diuresis.  Pt is discharged home.   CBC    Component Value Date/Time   WBC 7.8 10/10/2021 0122   RBC 2.75 (L) 10/10/2021 0122   HGB 9.0 (L) 10/10/2021 0122   HGB 13.2 08/01/2021 1048   HCT 27.4 (L) 10/10/2021 0122   HCT 38.5 08/01/2021 1048   PLT 167 10/10/2021 0122   PLT 219 08/01/2021 1048   MCV 99.6 10/10/2021 0122   MCV 97 08/01/2021 1048   MCH 32.7 10/10/2021 0122   MCHC 32.8 10/10/2021 0122   RDW 13.7 10/10/2021 0122   RDW 13.2 08/01/2021 1048   LYMPHSABS 1.7 10/16/2018 1406   MONOABS 0.6 10/16/2018 1406   EOSABS 0.0 10/16/2018 1406   BASOSABS 0.1 10/16/2018 1406    BMET    Component Value Date/Time   NA 132 (L) 10/10/2021 0122   NA 141 08/01/2021 1048   K 3.9 10/10/2021 0122   CL 99 10/10/2021 0122   CO2 25 10/10/2021 0122   GLUCOSE 112 (H) 10/10/2021 0122   BUN 5 (L) 10/10/2021 0122   BUN  11 08/01/2021 1048   CREATININE 0.59 10/10/2021 0122   CALCIUM 8.3 (L) 10/10/2021 0122   GFRNONAA >60 10/10/2021 0122   GFRAA >60 11/07/2015 0219       Discharge Diagnosis:  Status post aortobifemoral bypass surgery [Z95.828] Aortoiliac occlusive disease ([place]) [I74.09]  Secondary Diagnosis: Patient Active Problem List   Diagnosis Date Noted   Status post aortobifemoral bypass surgery 10/05/2021   Aortoiliac occlusive disease ([place]) 10/05/2021   Atypical chest pain 09/21/2021   Pre-op evaluation 09/21/2021   Hyperlipidemia LDL goal <70 09/21/2021   Encounter for Papanicolaou smear for cervical cancer screening 09/09/2021   Adjustment disorder 08/01/2021   Tobacco  abuse 08/01/2021   Severe peripheral arterial disease ([place]) 08/01/2021   Left hip pain 08/01/2021   COLONIC POLYPS 09/10/2008   Crohn's disease ([place]) 09/10/2008   Past Medical History:  Diagnosis Date   Arthritis    Crohn disease ([place])    Depression    Headache    PAD (peripheral artery disease) ([place])      Allergies as of 10/10/2021       Reactions   Codeine Itching        Medication List     TAKE these medications    aspirin 325 MG EC tablet TAKE 1 TABLET BY MOUTH EVERY DAY   atorvastatin 40 MG tablet Commonly known as: LIPITOR Take 1 tablet (40 mg total) by mouth daily.   buPROPion 150 MG 24 hr tablet Commonly known as: WELLBUTRIN XL TAKE 1 TABLET BY MOUTH EVERY DAY   diclofenac Sodium 1 % Gel Commonly known as: Voltaren Apply 4 g topically 4 (four) times daily. As needed for hip pain   nicotine 21 mg/24hr patch Commonly known as: NICODERM CQ - dosed in mg/24 hours Place 21 mg onto the skin daily.   oxyCODONE-acetaminophen 5-325 MG tablet Commonly known as: PERCOCET/ROXICET Take 1 tablet by mouth every 6 (six) hours as needed for moderate pain.        Instructions:  [facility] Discharge Instructions   Open Aortic Surgery  Please refer to the following instructions for your post-procedure care. Your surgeon or Physician Assistant will discuss any changes with you.  Activity  Avoid lifting more than eight pounds (a gallon of milk) until after your first post-operative visit. You are encouraged to walk as much as you can. You can slowly return to normal activities but must avoid strenuous activity and heavy lifting until your doctor tells you it's okay. Heavy lifting can hurt the incision and cause a hernia. Avoid activities such as vacuuming or swinging a golf club. It is normal to feel tired for several weeks after your surgery. Do not drive until your doctor gives the okay and you are no longer taking prescription  pain medications. It is also normal to have difficulty with sleep habits, eating and bowl movements after surgery. These will go away with time.  Bathing/Showering  Shower daily after you go home. Do not soak in a bathtub, hot tub, or swim until the incision heals.  Incision Care  Shower every day. Clean your incision with mild soap and water. Pat the area dry with a clean towel. You do not need a bandage unless otherwise instructed. Do not apply any ointments or creams to your incision. You may have skin glue on your incision. Do not peel it off. It will come off on its own in about one week. If you have staples or sutures along your incision,  they will be removed at your post op appointment.  If you have groin incisions, wash the groin wounds with soap and water daily and pat dry. (No tub bath-only shower)  Then put a dry gauze or washcloth in the groin to keep this area dry to help prevent wound infection.  Do this daily and as needed.  Do not use Vaseline or neosporin on your incisions.  Only use soap and water on your incisions and then protect and keep dry.  Diet  Resume your normal diet. There are no special food restriction following this procedure. A low fat/low cholesterol diet is recommended for all patients with vascular disease. After your aortic surgery, it's normal to feel full faster than usual and to not feel as hungry as you normally would. You will probably lose weight initially following your surgery. It's best to eat small, frequent meals over the course of the day. Call the office if you find that you are unable to eat even small meals.   In order to heal from your surgery, it is CRITICAL to get adequate nutrition. Your body requires vitamins, minerals, and protein. Vegetables are the best source of vitamins and minerals.  If you have pain, you may take over-the-counter pain reliever such as acetaminophen (Tylenol). If you were prescribed a stronger pain medication, please be  aware these medication can cause nausea and constipation. Prevent nausea by taking the medication with a snack or meal. Avoid constipation by drinking plenty of fluids and eating foods with a high amount of fiber, such as fruits, vegetables and grains. Take 152m of the over-the-counter stool softener Colace twice a day as needed to help with constipation. A laxative, such as Milk of Magnesia, may be recommended for you at this time. Do not take a laxative unless your surgeon or P.A. tells you it's OK.  Do not take Tylenol if you are taking stronger pain medications (such as Percocet).  Follow Up  Our office will schedule a follow up appointment 2-3 weeks after discharge.  Please call [facility] for any of the following conditions    .     Severe or worsening pain in your legs or feet or in your abdomen back or chest. Increased pain, redness drainage (pus) from your incision site. Increased abdominal pain, bloating, nausea, vomiting, or persistent diarrhea. Fever of 101 degrees or higher. Swelling in your leg (s).  Reduce your risk of vascular disease  Stop smoking. If you would like help, call [facility] at 1-800-QUIT-NOW (4021287422 or [facility] [phone] Manage your cholesterol Maintain a desired weight Control your diabetes Keep your blood pressure down  If you have any questions please call the office at [phone]   Prescriptions given: 1.  Roxicet #20 No Refill  Disposition: home  Patient's condition: is Good  Follow up: 1. [facility] in 2-3 weeks   SLeontine Locket PA-C [facility] [phone]/04/2021  7:08 AM   - For VQI Registry use -  Post-op:  Time to Extubation: [x]  In OR, [ ]  < 12 hrs, [ ]  12-24 hrs, [ ]  >=24 hrs Vasopressors Req. Post-op: No ICU Stay: 2 day in ICU Transfusion: No   If yes, n/a units given MI: No, [ ]  Troponin only, [ ]  EKG or Clinical New Arrhythmia: No  Complications: CHF: No Resp failure: No, [  ] Pneumonia, [ ]  Ventilator Chg in renal function: No, [ ]  Inc. Cr > 0.5, [ ]  Temp. Dialysis, [ ]  Permanent dialysis  Leg ischemia: No, no Surgery needed, [ ]  Yes, Surgery needed, [ ]  Amputation Bowel ischemia: No, [ ]  Medical Rx, [ ]  Surgical Rx Wound complication: No, [ ]  Superficial separation/infection, [ ]  Return to OR Return to OR: No  Return to OR for bleeding: No Stroke: No, [ ]  Minor, [ ]  Major  Discharge medications: Statin use:  Yes  ASA use:  Yes   Plavix use:  No  Beta blocker use:  No  ACEI use:  No ARB use:  No CCB use:  No Coumadin use:  No

## 2021-10-13 ENCOUNTER — Other Ambulatory Visit: Payer: Self-pay

## 2021-10-13 ENCOUNTER — Emergency Department (HOSPITAL_COMMUNITY): Payer: 59

## 2021-10-13 ENCOUNTER — Telehealth: Payer: Self-pay

## 2021-10-13 ENCOUNTER — Encounter (HOSPITAL_COMMUNITY): Payer: Self-pay | Admitting: Emergency Medicine

## 2021-10-13 ENCOUNTER — Observation Stay (HOSPITAL_COMMUNITY)
Admission: EM | Admit: 2021-10-13 | Discharge: 2021-10-14 | Disposition: A | Discharge status: Home / Self Care | Payer: 59 | Attending: Vascular Surgery | Admitting: Vascular Surgery

## 2021-10-13 DIAGNOSIS — G8918 Other acute postprocedural pain: Secondary | ICD-10-CM | POA: Diagnosis present

## 2021-10-13 DIAGNOSIS — F17210 Nicotine dependence, cigarettes, uncomplicated: Secondary | ICD-10-CM | POA: Insufficient documentation

## 2021-10-13 DIAGNOSIS — Z20822 Contact with and (suspected) exposure to covid-19: Secondary | ICD-10-CM | POA: Insufficient documentation

## 2021-10-13 DIAGNOSIS — Z7982 Long term (current) use of aspirin: Secondary | ICD-10-CM | POA: Insufficient documentation

## 2021-10-13 DIAGNOSIS — I7409 Other arterial embolism and thrombosis of abdominal aorta: Secondary | ICD-10-CM

## 2021-10-13 DIAGNOSIS — Z951 Presence of aortocoronary bypass graft: Secondary | ICD-10-CM | POA: Diagnosis not present

## 2021-10-13 DIAGNOSIS — S301XXA Contusion of abdominal wall, initial encounter: Secondary | ICD-10-CM

## 2021-10-13 DIAGNOSIS — K91841 Postprocedural hemorrhage and hematoma of a digestive system organ or structure following other procedure: Secondary | ICD-10-CM | POA: Diagnosis present

## 2021-10-13 LAB — CBC WITH DIFFERENTIAL/PLATELET
Abs Immature Granulocytes: 0.04 10*3/uL (ref 0.00–0.07)
Basophils Absolute: 0 10*3/uL (ref 0.0–0.1)
Basophils Relative: 0 %
Eosinophils Absolute: 0.1 10*3/uL (ref 0.0–0.5)
Eosinophils Relative: 1 %
HCT: 31.5 % — ABNORMAL LOW (ref 36.0–46.0)
Hemoglobin: 10.8 g/dL — ABNORMAL LOW (ref 12.0–15.0)
Immature Granulocytes: 1 %
Lymphocytes Relative: 20 %
Lymphs Abs: 1.5 10*3/uL (ref 0.7–4.0)
MCH: 33.4 pg (ref 26.0–34.0)
MCHC: 34.3 g/dL (ref 30.0–36.0)
MCV: 97.5 fL (ref 80.0–100.0)
Monocytes Absolute: 0.5 10*3/uL (ref 0.1–1.0)
Monocytes Relative: 7 %
Neutro Abs: 5.4 10*3/uL (ref 1.7–7.7)
Neutrophils Relative %: 71 %
Platelets: 281 10*3/uL (ref 150–400)
RBC: 3.23 MIL/uL — ABNORMAL LOW (ref 3.87–5.11)
RDW: 14.6 % (ref 11.5–15.5)
WBC: 7.6 10*3/uL (ref 4.0–10.5)
nRBC: 0 % (ref 0.0–0.2)

## 2021-10-13 LAB — RESP PANEL BY RT-PCR (FLU A&B, COVID) ARPGX2
Influenza A by PCR: NEGATIVE
Influenza B by PCR: NEGATIVE
SARS Coronavirus 2 by RT PCR: NEGATIVE

## 2021-10-13 LAB — BASIC METABOLIC PANEL
Anion gap: 10 (ref 5–15)
BUN: 8 mg/dL (ref 6–20)
CO2: 24 mmol/L (ref 22–32)
Calcium: 8.9 mg/dL (ref 8.9–10.3)
Chloride: 99 mmol/L (ref 98–111)
Creatinine, Ser: 0.78 mg/dL (ref 0.44–1.00)
GFR, Estimated: >60 mL/min (ref >60)
Glucose, Bld: 119 mg/dL — ABNORMAL HIGH (ref 70–99)
Potassium: 3.5 mmol/L (ref 3.5–5.1)
Sodium: 133 mmol/L — ABNORMAL LOW (ref 135–145)

## 2021-10-13 LAB — PROTIME-INR
INR: 1 (ref 0.8–1.2)
Prothrombin Time: 12.6 seconds (ref 11.4–15.2)

## 2021-10-13 MED ORDER — DIPHENHYDRAMINE HCL 50 MG/ML IJ SOLN
12.5000 mg | Freq: Once | INTRAMUSCULAR | Status: AC
  Administered 2021-10-13: 12.5 mg via INTRAVENOUS
  Filled 2021-10-13: qty 1

## 2021-10-13 MED ORDER — IOHEXOL 350 MG/ML SOLN
100.0000 mL | Freq: Once | INTRAVENOUS | Status: AC | PRN
  Administered 2021-10-13: 100 mL via INTRAVENOUS

## 2021-10-13 MED ORDER — HYDROMORPHONE HCL 1 MG/ML IJ SOLN
1.0000 mg | Freq: Once | INTRAMUSCULAR | Status: AC
  Administered 2021-10-13: 1 mg via INTRAVENOUS
  Filled 2021-10-13: qty 1

## 2021-10-13 NOTE — Telephone Encounter (Signed)
Patient's husband calls today to report that a few hours ago they noticed a very hard lump in the patient's right groin. It is not painful, red, draining. She is not having chills or running a fever. Denies any other issues, but thinks over the last 2-3 hours the size of the lump has increased from the size of an egg to half a tennis ball. She is s/p aortobifem on 10/05/2021. Discussed with PA, advised them to call 911 overnight if area suddenly swelled to a much larger size - placed on schedule tomorrow for right pseduo and eval.

## 2021-10-13 NOTE — ED Triage Notes (Signed)
Patient here with pain at post surgical site in right groin.  She states that she had aortobifemoral bypass due to aortoiliac occlusive disease.  The area of the right groin has a large lump, warm to the touch and painful.  Patient states she is having some shortness of breath in triage.

## 2021-10-13 NOTE — Consult Note (Addendum)
Hospital Consult    Reason for Consult: New hematoma appreciated in the right groin Requesting Physician: Armandina Gemma MRN #:  [id number]  History of Present Illness: This is a 55 y.o. female well-known to the vascular surgery service having undergone lysis of adhesions, aortobifemoral bypass on 09/25/2021 for lifestyle limiting claudication.  The case was uneventful, and she was discharged home.  Day 5.  At home, she has been doing well, regaining strength.  Her appetite is slowly improving.  She has had episodes of chills, but no documented fevers.  Earlier today she appreciated swelling in the right groin.  She was not doing anything strenuous at that time.  This progressed throughout the day which was concerning to both her and her husband.  She called our office at 4:00 who advised her to seek immediate medical7Attention in the ED should the groin hematoma enlarge further.  The hematoma enlarge slightly, and therefore the patient presented to the [facility] emergency department.  Since her presentation, there has been no change in the size of her groin hematoma.  Past Medical History:  Diagnosis Date   Arthritis    Crohn disease ([place])    Depression    Headache    PAD (peripheral artery disease) ([place])     Past Surgical History:  Procedure Laterality Date   ABDOMINAL SURGERY     Patient states part of intestine was removed   AORTA - BILATERAL FEMORAL ARTERY BYPASS GRAFT Bilateral 10/05/2021   Procedure: AORTOBIFEMORAL BYPASS GRAFT AND LYSIS OF ADHESION;  Surgeon: Cherre , MD;  Location: [facility] OR;  Service: Vascular;  Laterality: Bilateral;    Allergies  Allergen Reactions   Codeine Itching    Prior to Admission medications   Medication Sig Start Date End Date Taking? Authorizing Provider  acetaminophen (TYLENOL) 500 MG tablet Take 500 mg by mouth every 6 (six) hours as needed for moderate pain.   Yes [provider]  aspirin 325 MG EC tablet TAKE 1 TABLET BY MOUTH  EVERY DAY Patient taking differently: 325 mg daily. 10/06/21  Yes Pray, Norwood Levo, MD  atorvastatin (LIPITOR) 40 MG tablet Take 1 tablet (40 mg total) by mouth daily. 08/16/21  Yes Pray, Norwood Levo, MD  buPROPion (WELLBUTRIN XL) 150 MG 24 hr tablet TAKE 1 TABLET BY MOUTH EVERY DAY Patient taking differently: 150 mg daily. 08/23/21  Yes Pray, Norwood Levo, MD  oxyCODONE-acetaminophen (PERCOCET/ROXICET) 5-325 MG tablet Take 1 tablet by mouth every 6 (six) hours as needed for moderate pain. 10/10/21  Yes Dagoberto Ligas, PA-C  diclofenac Sodium (VOLTAREN) 1 % GEL Apply 4 g topically 4 (four) times daily. As needed for hip pain Patient not taking: Reported on 10/13/2021 08/16/21   Lenoria Chime, MD    Social History   Socioeconomic History   Marital status: Married    Spouse name: Not on file   Number of children: 2   Years of education: Not on file   Highest education level: Not on file  Occupational History   Occupation: Secretary/administrator  Tobacco Use   Smoking status: Every Day    Packs/day: 0.50    Years: 40.00    Pack years: 20.00    Types: Cigarettes   Smokeless tobacco: Never  Vaping Use   Vaping Use: Never used  Substance and Sexual Activity   Alcohol use: Yes    Comment: occ   Drug use: No   Sexual activity: Not on file  Other Topics Concern   Not on  file  Social History Narrative   Not on file   Social Determinants of Health   Financial Resource Strain: Not on file  Food Insecurity: Not on file  Transportation Needs: Not on file  Physical Activity: Not on file  Stress: Not on file  Social Connections: Not on file  Intimate Partner Violence: Not on file     Family History  Problem Relation Age of Onset   Crohn's disease Mother    Hyperlipidemia Father    Heart failure Father    Heart attack Father 64   Crohn's disease Father    Crohn's disease Sister    Ovarian cancer Maternal Grandmother    Diabetes Paternal Grandmother    Diabetes Son     ROS:  Otherwise negative unless mentioned in HPI  Physical Examination  Vitals:   10/13/21 2156 10/13/21 2200  BP: (!) 146/66 (!) 141/69  Pulse: 89 77  Resp: 18 16  Temp:    SpO2: 99% 100%   Body mass index is 18.72 kg/m.  General:  WDWN in NAD Gait: Not observed HENT: WNL, normocephalic Pulmonary: normal non-labored breathing, without Rales, rhonchi,  wheezing Cardiac: regular Abdomen:  soft, NT/ND, no masses, midline laparotomy incision healing well Skin: without rashes Vascular Exam/Pulses: bilateral femoral pulses palpable, right groin with hematoma, palpable pulse underneath, right DP palpable, bilateral feet warm, no concern for ischemia Extremities: without ischemic changes, without Gangrene , without cellulitis; without open wounds;  Musculoskeletal: no muscle wasting or atrophy  Neurologic: A&O X 3;  No focal weakness or paresthesias are detected; speech is fluent/normal Psychiatric:  The pt has Normal affect. Lymph:  Unremarkable  CBC    Component Value Date/Time   WBC 7.6 10/13/2021 2144   RBC 3.23 (L) 10/13/2021 2144   HGB 10.8 (L) 10/13/2021 2144   HGB 13.2 08/01/2021 1048   HCT 31.5 (L) 10/13/2021 2144   HCT 38.5 08/01/2021 1048   PLT 281 10/13/2021 2144   PLT 219 08/01/2021 1048   MCV 97.5 10/13/2021 2144   MCV 97 08/01/2021 1048   MCH 33.4 10/13/2021 2144   MCHC 34.3 10/13/2021 2144   RDW 14.6 10/13/2021 2144   RDW 13.2 08/01/2021 1048   LYMPHSABS 1.5 10/13/2021 2144   MONOABS 0.5 10/13/2021 2144   EOSABS 0.1 10/13/2021 2144   BASOSABS 0.0 10/13/2021 2144    BMET    Component Value Date/Time   NA 133 (L) 10/13/2021 2144   NA 141 08/01/2021 1048   K 3.5 10/13/2021 2144   CL 99 10/13/2021 2144   CO2 24 10/13/2021 2144   GLUCOSE 119 (H) 10/13/2021 2144   BUN 8 10/13/2021 2144   BUN 11 08/01/2021 1048   CREATININE 0.78 10/13/2021 2144   CALCIUM 8.9 10/13/2021 2144   GFRNONAA >60 10/13/2021 2144   GFRAA >60 11/07/2015 0219    COAGS: Lab  Results  Component Value Date   INR 1.0 10/13/2021   INR 1.0 10/05/2021   INR 0.9 09/27/2021     Non-Invasive Vascular Imaging:   none  ASSESSMENT/PLAN: This is a 55 y.o. female postop day 8 from lysis of adhesions, aortobifemoral bypass.  She has been doing well until this morning when she appreciated swelling in her right groin.  There is a mass in the right groin,but the area is soft, and not expanding.  I am concerned for anastomotic pseudoaneurysm.   -Patient has CT pending.  I discussed the above with CT, and asked for her to be next in line,  as the results could result in a trip to the operating room.  I discussed the above with the patient, and asked her to remain NPO.  I will discuss further details once the CT has been completed. Bedrest  _______________________   Patient CT scan reviewed demonstrating seroma surrounding the right groin ABF anastomosis.  No active extravasation appreciated, no pseudoaneurysm, air at the proximal aspect appears to be extrinsic, not communicating with the fluid collection.  No signs of infection.  I personally discussed the CT with my radiology colleagues who agree with the findings.  Postoperative seromas can be managed both operatively and nonoperatively.  I told Natha and her husband that I would discuss the CT further with Dr. [name] in the morning should they choose to wait.  Both felt comfortable staying the night, as it is already nearly 1 AM.  I asked that she remain n.p.o. should he choose to washout the site.  Patient can be admitted under observation to my service. No ambulatory restrictions.   Cassandria Santee MD MS [facility] [phone] 10/13/2021  10:34 PM

## 2021-10-13 NOTE — ED Notes (Signed)
IV team at the bedside. 

## 2021-10-13 NOTE — ED Provider Notes (Signed)
[facility] Provider Note   CSN: [id number] Arrival date & time: 10/13/21  1824     History Chief Complaint  Patient presents with   Post-op Problem    Melissa Chaney is a 55 y.o. female.  HPI  55 year old female with history significant for peripheral arterial disease status post aortobifemoral bypass grafting with lysis of adhesions on 10/05/2021, discharged from the hospital on 10/10/2021 who presents to the emergency department with pain and swelling/hematoma at her incision site in the right groin.  The patient states that she had been doing well since discharge postoperatively.  Today, they noticed a hard lump in the patient's groin.  It was initially not painful, red or draining.  It extends become somewhat painful to the touch.  She denies any fever or chills.  She does feel like the lump increased in size over the course of today from the size of an egg to about the size of half of a tennis ball.  She denies any other complaints at this time.  Past Medical History:  Diagnosis Date   Arthritis    Crohn disease ([place])    Depression    Headache    PAD (peripheral artery disease) ([place])     Patient Active Problem List   Diagnosis Date Noted   Acute post-operative pain 10/14/2021   Status post aortobifemoral bypass surgery 10/05/2021   Aortoiliac occlusive disease ([place]) 10/05/2021   Atypical chest pain 09/21/2021   Pre-op evaluation 09/21/2021   Hyperlipidemia LDL goal <70 09/21/2021   Encounter for Papanicolaou smear for cervical cancer screening 09/09/2021   Adjustment disorder 08/01/2021   Tobacco abuse 08/01/2021   Severe peripheral arterial disease ([place]) 08/01/2021   Left hip pain 08/01/2021   COLONIC POLYPS 09/10/2008   Crohn's disease ([place]) 09/10/2008    Past Surgical History:  Procedure Laterality Date   ABDOMINAL SURGERY     Patient states part of intestine was removed   AORTA - BILATERAL FEMORAL ARTERY BYPASS GRAFT  Bilateral 10/05/2021   Procedure: AORTOBIFEMORAL BYPASS GRAFT AND LYSIS OF ADHESION;  Surgeon: Cherre Robins, MD;  Location: [facility] OR;  Service: Vascular;  Laterality: Bilateral;     OB History   No obstetric history on file.     Family History  Problem Relation Age of Onset   Crohn's disease Mother    Hyperlipidemia Father    Heart failure Father    Heart attack Father 34   Crohn's disease Father    Crohn's disease Sister    Ovarian cancer Maternal Grandmother    Diabetes Paternal Grandmother    Diabetes Son     Social History   Tobacco Use   Smoking status: Every Day    Packs/day: 0.50    Years: 40.00    Pack years: 20.00    Types: Cigarettes   Smokeless tobacco: Never  Vaping Use   Vaping Use: Never used  Substance Use Topics   Alcohol use: Yes    Comment: occ   Drug use: No    Home Medications Prior to Admission medications   Medication Sig Start Date End Date Taking? Authorizing Provider  acetaminophen (TYLENOL) 500 MG tablet Take 500 mg by mouth every 6 (six) hours as needed for moderate pain.   Yes [provider]  aspirin 325 MG EC tablet TAKE 1 TABLET BY MOUTH EVERY DAY Patient taking differently: 325 mg daily. 10/06/21  Yes Pray, Norwood Levo, MD  atorvastatin (LIPITOR) 40 MG tablet Take  1 tablet (40 mg total) by mouth daily. 08/16/21  Yes Pray, Norwood Levo, MD  buPROPion (WELLBUTRIN XL) 150 MG 24 hr tablet TAKE 1 TABLET BY MOUTH EVERY DAY Patient taking differently: 150 mg daily. 08/23/21  Yes Pray, Norwood Levo, MD  oxyCODONE-acetaminophen (PERCOCET/ROXICET) 5-325 MG tablet Take 1 tablet by mouth every 6 (six) hours as needed for moderate pain. 10/10/21  Yes Dagoberto Ligas, PA-C  diclofenac Sodium (VOLTAREN) 1 % GEL Apply 4 g topically 4 (four) times daily. As needed for hip pain Patient not taking: Reported on 10/13/2021 08/16/21   Lenoria Chime, MD    Allergies    Codeine  Review of Systems   Review of Systems  Constitutional:  Negative  for chills and fever.  HENT:  Negative for ear pain and sore throat.   Eyes:  Negative for pain and visual disturbance.  Respiratory:  Negative for cough and shortness of breath.   Cardiovascular:  Negative for chest pain and palpitations.  Gastrointestinal:  Negative for abdominal pain and vomiting.  Genitourinary:  Negative for dysuria and hematuria.  Musculoskeletal:  Negative for arthralgias and back pain.  Skin:  Negative for color change and rash.       Swelling  Neurological:  Negative for seizures and syncope.  All other systems reviewed and are negative.  Physical Exam Updated Vital Signs BP (!) 144/68   Pulse 91   Temp (!) 97.5 F (36.4 C) (Oral)   Resp 16   Ht 5' 6"  (1.676 m)   Wt 52.6 kg   SpO2 100%   BMI 18.72 kg/m   Physical Exam Vitals and nursing note reviewed.  Constitutional:      General: She is not in acute distress.    Appearance: She is well-developed.  HENT:     Head: Normocephalic and atraumatic.  Eyes:     Conjunctiva/sclera: Conjunctivae normal.     Pupils: Pupils are equal, round, and reactive to light.  Cardiovascular:     Rate and Rhythm: Normal rate and regular rhythm.     Heart sounds: No murmur heard. Pulmonary:     Effort: Pulmonary effort is normal. No respiratory distress.     Breath sounds: Normal breath sounds.  Abdominal:     General: There is no distension.     Palpations: Abdomen is soft.     Tenderness: There is no abdominal tenderness. There is no guarding.  Musculoskeletal:        General: No swelling, deformity or signs of injury.     Cervical back: Neck supple.     Comments: 2+ DP pulses in the right dorsalis pedis, faint 1+ pulses in the left dorsalis pedis  Skin:    General: Skin is warm and dry.     Capillary Refill: Capillary refill takes less than 2 seconds.     Findings: No lesion or rash.     Comments: Abdominal incision site appears to be well-healing with no erythema, tenderness or discharge, left groin  incision site the same, right groin incision site with no erythema or discharge, no bleeding, no wound dehiscence, hematoma palpable around 4 cm in size underneath the incision, palpable femoral pulse underneath.  Neurological:     General: No focal deficit present.     Mental Status: She is alert. Mental status is at baseline.  Psychiatric:        Mood and Affect: Mood normal.    ED Results / Procedures / Treatments   Labs (all labs ordered  are listed, but only abnormal results are displayed) Labs Reviewed  CBC WITH DIFFERENTIAL/PLATELET - Abnormal; Notable for the following components:      Result Value   RBC 3.23 (*)    Hemoglobin 10.8 (*)    HCT 31.5 (*)    All other components within normal limits  BASIC METABOLIC PANEL - Abnormal; Notable for the following components:   Sodium 133 (*)    Glucose, Bld 119 (*)    All other components within normal limits  RESP PANEL BY RT-PCR (FLU A&B, COVID) ARPGX2  PROTIME-INR    EKG None  Radiology CT Angio Aortobifemoral W and/or Wo Contrast  Addendum Date: 10/14/2021   ADDENDUM REPORT: 10/14/2021 00:42 ADDENDUM: A small focus of air is noted adjacent to superior aspect of the fluid collection at the right femoral anastomotic site. This is felt to be extrinsic to the fluid collection and likely related to residual air from the recent surgery. Again no findings to suggest infection are identified. Electronically Signed   By: Inez Catalina M.D.   On: 10/14/2021 00:42   Result Date: 10/14/2021 CLINICAL DATA:  History of occlusive iliac disease with subsequent aortobifemoral bypass graft, prominence at the right inguinal surgical site EXAM: CT ANGIOGRAPHY OF ABDOMINAL AORTA WITH ILIOFEMORAL RUNOFF TECHNIQUE: Multidetector CT imaging of the abdomen, pelvis and lower extremities was performed using the standard protocol during bolus administration of intravenous contrast. Multiplanar CT image reconstructions and MIPs were obtained to evaluate the  vascular anatomy. CONTRAST:  150m OMNIPAQUE IOHEXOL 350 MG/ML SOLN COMPARISON:  09/13/2021 FINDINGS: VASCULAR Aorta: Abdominal aorta demonstrates scattered atherosclerotic calcifications. There are changes consistent within aortobifemoral bypass graft. The distal abdominal aorta and iliac vessels proximally are occluded. Celiac: Patent without evidence of aneurysm, dissection, vasculitis or significant stenosis. SMA: Patent without evidence of aneurysm, dissection, vasculitis or significant stenosis. Renals: Both renal arteries are patent without evidence of aneurysm, dissection, vasculitis, fibromuscular dysplasia or significant stenosis. IMA: Origin of the IMA is not visualized. Opacification of the distal branches is seen via collateral flow. RIGHT Lower Extremity Inflow: The right limb of the aortobifemoral bypass graft is widely patent. The anastomosis to the common femoral artery is patent although moderate stenosis of the common femoral artery is noted just beyond the anastomotic site. Additionally adjacent to the anastomosis there is a 4.7 x 3.1 cm rounded fluid attenuation lesion likely representing a postoperative seroma. No vascularity or flow is noted within to suggest patent pseudoaneurysm although this could represent a thrombosed pseudoaneurysm. Runoff: The superficial femoral artery shows atherosclerotic change without focal hemodynamically significant stenosis. The popliteal artery is patent. The popliteal trifurcation is patent as well with two vessel runoff to the level of the right ankle. LEFT Lower Extremity Inflow: The left limb of the aortobifemoral bypass graft is widely patent. Distal anastomosis is patent as well. Runoff: Common femoral artery is within normal limits. Superficial femoral artery shows atherosclerotic calcification without focal high-grade stenosis. The left popliteal artery is widely patent with mild atherosclerotic calcifications. Popliteal trifurcation is patent and there  is runoff via the posterior tibial artery into the left foot. The anterior tibial and peroneal arteries are somewhat diminutive distally. This poor visualization may be related to timing of the contrast bolus. Veins: No specific venous abnormality is noted. Review of the MIP images confirms the above findings. NON-VASCULAR Lower chest: No acute abnormality. Hepatobiliary: Fatty infiltration of the liver is noted. Gallbladder is within normal limits. Pancreas: Unremarkable. No pancreatic ductal dilatation or surrounding inflammatory changes.  Spleen: Normal in size without focal abnormality. Adrenals/Urinary Tract: Adrenal glands are unremarkable. Kidneys demonstrate a normal enhancement pattern bilaterally. Excretion is noted bilaterally as well. The bladder is partially distended. Stomach/Bowel: Colon shows no obstructive or inflammatory changes. Changes consistent with right hemicolectomy. No obstructive changes are seen. Small bowel and stomach are unremarkable. The density seen in the duodenum is again identified on image number 60 of series 5 and again may represent a small polyp. Lymphatic: No significant lymphadenopathy is noted. Reproductive: Uterus and bilateral adnexa are unremarkable. Other: Mild free fluid is noted within the pelvis likely physiologic in nature. Musculoskeletal: No acute or significant osseous findings. IMPRESSION: VASCULAR Patent aortobifemoral bypass graft. Distal runoff is similar to that seen on the prior exam. Adjacent to the right femoral anastomotic site, there is an ovoid fluid collection which likely represents a postoperative seroma. No findings to suggest superimposed infection are identified. No findings to suggest pseudoaneurysm are seen. NON-VASCULAR Filling defect within the duodenum which again may represent a small polyp. No acute abnormality in the abdomen or pelvis is noted. Electronically Signed: By: Inez Catalina M.D. On: 10/14/2021 00:06    Procedures Procedures    Medications Ordered in ED Medications  acetaminophen (TYLENOL) tablet 500 mg (has no administration in time range)  aspirin EC tablet 325 mg (has no administration in time range)  atorvastatin (LIPITOR) tablet 40 mg (has no administration in time range)  buPROPion (WELLBUTRIN XL) 24 hr tablet 150 mg (has no administration in time range)  oxyCODONE-acetaminophen (PERCOCET/ROXICET) 5-325 MG per tablet 1 tablet (1 tablet Oral Given 10/14/21 0819)  HYDROmorphone (DILAUDID) injection 1 mg (1 mg Intravenous Given 10/13/21 2159)  diphenhydrAMINE (BENADRYL) injection 12.5 mg (12.5 mg Intravenous Given 10/13/21 2159)  iohexol (OMNIPAQUE) 350 MG/ML injection 100 mL (100 mLs Intravenous Contrast Given 10/13/21 2322)    ED Course  I have reviewed the triage vital signs and the nursing notes.  Pertinent labs & imaging results that were available during my care of the patient were reviewed by me and considered in my medical decision making (see chart for details).    MDM Rules/Calculators/A&P                           55 year old female with history significant for peripheral arterial disease status post aortobifemoral bypass grafting with lysis of adhesions on 10/05/2021, discharged from the hospital on 10/10/2021 who presents to the emergency department with pain and swelling/hematoma at her incision site in the right groin.  The patient states that she had been doing well since discharge postoperatively.  Today, they noticed a hard lump in the patient's groin.  It was initially not painful, red or draining.  It extends become somewhat painful to the touch.  She denies any fever or chills.  She does feel like the lump increased in size over the course of today from the size of an egg to about the size of half of a tennis ball.  She denies any other complaints at this time.  On arrival, the patient was tachycardic P107, hypertensive BP 150/79, afebrile, hemodynamically stable.  Her physical exam was  concerning for hematoma about her right groin incision site near her anastomosis following her aorto iliofemoral bypass site.  Concern for graft leak.  CTA urgently ordered stat in addition to screening labs.  I spoke with on-call vascular surgery, Dr. Unk Lightning who will follow-up on the patient's CTA.  Patient CTA results: IMPRESSION:  VASCULAR  Patent aortobifemoral bypass graft. Distal runoff is similar to that  seen on the prior exam.     Adjacent to the right femoral anastomotic site, there is an ovoid  fluid collection which likely represents a postoperative seroma. No  findings to suggest superimposed infection are identified. No  findings to suggest pseudoaneurysm are seen.     NON-VASCULAR     Filling defect within the duodenum which again may represent a small  polyp.     No acute abnormality in the abdomen or pelvis is noted.  Following evaluation, surgery recommended admission for observation.  She has a right groin seroma postoperatively.  Plan for reevaluation by vascular surgery in a.m. with possible consideration for operative management vs non-op. Hospitalist medicine consulted for admission.   Final Clinical Impression(s) / ED Diagnoses Final diagnoses:  Hematoma of groin, initial encounter  Post-op pain    Rx / DC Orders ED Discharge Orders     None        Regan Lemming, MD 10/14/21 1030

## 2021-10-14 ENCOUNTER — Ambulatory Visit: Payer: 59 | Admitting: Family Medicine

## 2021-10-14 ENCOUNTER — Encounter (HOSPITAL_COMMUNITY): Payer: 59

## 2021-10-14 DIAGNOSIS — G8918 Other acute postprocedural pain: Secondary | ICD-10-CM | POA: Diagnosis present

## 2021-10-14 MED ORDER — BUPROPION HCL ER (XL) 150 MG PO TB24
150.0000 mg | ORAL_TABLET | Freq: Every day | ORAL | Status: DC
  Filled 2021-10-14: qty 1

## 2021-10-14 MED ORDER — ATORVASTATIN CALCIUM 40 MG PO TABS: 40.0000 mg | ORAL_TABLET | Freq: Every day | ORAL | Status: DC

## 2021-10-14 MED ORDER — OXYCODONE-ACETAMINOPHEN 5-325 MG PO TABS
1.0000 | ORAL_TABLET | Freq: Four times a day (QID) | ORAL | Status: DC | PRN
  Administered 2021-10-14 (×2): 1 via ORAL
  Filled 2021-10-14 (×2): qty 1

## 2021-10-14 MED ORDER — ASPIRIN EC 325 MG PO TBEC: 325.0000 mg | DELAYED_RELEASE_TABLET | Freq: Every day | ORAL | Status: DC

## 2021-10-14 MED ORDER — ACETAMINOPHEN 500 MG PO TABS: 500.0000 mg | ORAL_TABLET | Freq: Four times a day (QID) | ORAL | Status: DC | PRN

## 2021-10-14 NOTE — Progress Notes (Signed)
  Daily Progress Note  S/p: POD 9 ABF  Subjective: Continues to have mild pain at the right groin. No subjective expansion overnight  Objective: Vitals:   10/14/21 0055 10/14/21 0402  BP: 139/72 132/60  Pulse: 92 85  Resp: 12 14  Temp:    SpO2: 99% 100%    Physical Examination Bilateral femoral pulses 2+ Right groin mass soft, not expanding,no ecchymosis Palpable right pedal pulse, 1+ left DP  ASSESSMENT/PLAN:  Patient with imaging demonstrating right groin seroma.  No expansion overnight.  I have discussed her case with her primary surgeon Dr. Marjean Donna.  He will see her this morning.  Keep the patient n.p.o. until that time.   Cassandria Santee MD MS [facility] [phone] 10/14/2021  8:18 AM

## 2021-10-14 NOTE — Progress Notes (Signed)
[facility] PROGRESS NOTE  ASSESSMENT / PLAN: Melissa Chaney is a 55 y.o. female with right groin seroma.  I counseled the patient that this problem is best managed expectantly.  I encouraged her to avoid instrumenting the seroma to avoid infection.  If she develops any signs of infection, she should call us immediately.  We will keep her existing follow-up.  CT angiogram reviewed in detail.  No evidence of technical problem with repair.  SUBJECTIVE: Feeling well.  Reviewed case with Dr. Unk Lightning.  Reviewed CT angiogram in detail with patient  OBJECTIVE: BP (!) 144/68   Pulse 91   Temp (!) 97.5 F (36.4 C) (Oral)   Resp 16   Ht 5' 6"  (1.676 m)   Wt 52.6 kg   SpO2 100%   BMI 18.72 kg/m   No distress. Regular rate and rhythm. Unlabored breathing Soft abdomen without tenderness.  Midline incision healing well. Groins healing well.  Right groin has ballotable fluid collection consistent with seroma.  No external signs of infection.  CBC Latest Ref Rng & Units 10/13/2021 10/10/2021 10/09/2021  WBC 4.0 - 10.5 K/uL 7.6 7.8 7.9  Hemoglobin 12.0 - 15.0 g/dL 10.8(L) 9.0(L) 7.8(L)  Hematocrit 36.0 - 46.0 % 31.5(L) 27.4(L) 23.2(L)  Platelets 150 - 400 K/uL 281 167 118(L)     CMP Latest Ref Rng & Units 10/13/2021 10/10/2021 10/09/2021  Glucose 70 - 99 mg/dL 119(H) 112(H) 136(H)  BUN 6 - 20 mg/dL 8 5(L) <5(L)  Creatinine 0.44 - 1.00 mg/dL 0.78 0.59 0.46  Sodium 135 - 145 mmol/L 133(L) 132(L) 133(L)  Potassium 3.5 - 5.1 mmol/L 3.5 3.9 4.3  Chloride 98 - 111 mmol/L 99 99 100  CO2 22 - 32 mmol/L 24 25 30   Calcium 8.9 - 10.3 mg/dL 8.9 8.3(L) 7.8(L)  Total Protein 6.5 - 8.1 g/dL - - -  Total Bilirubin 0.3 - 1.2 mg/dL - - -  Alkaline Phos 38 - 126 U/L - - -  AST 15 - 41 U/L - - -  ALT 0 - 44 U/L - - -    Estimated Creatinine Clearance: 65.2 mL/min (by C-G formula based on SCr of 0.78 mg/dL).  Melissa Chaney. Melissa Breed, MD [facility] Phone Number: [phone] 10/14/2021 9:27 AM

## 2021-10-21 ENCOUNTER — Ambulatory Visit: Payer: 59

## 2021-10-24 ENCOUNTER — Other Ambulatory Visit: Payer: Self-pay

## 2021-10-24 DIAGNOSIS — I739 Peripheral vascular disease, unspecified: Secondary | ICD-10-CM

## 2021-11-01 ENCOUNTER — Other Ambulatory Visit: Payer: Self-pay | Admitting: Family Medicine

## 2021-11-01 DIAGNOSIS — I739 Peripheral vascular disease, unspecified: Secondary | ICD-10-CM

## 2021-11-08 ENCOUNTER — Ambulatory Visit (HOSPITAL_COMMUNITY)
Admission: RE | Admit: 2021-11-08 | Discharge: 2021-11-08 | Disposition: A | Discharge status: Home / Self Care | Payer: BC Managed Care – PPO | Source: Ambulatory Visit | Attending: Vascular Surgery | Admitting: Vascular Surgery

## 2021-11-08 ENCOUNTER — Other Ambulatory Visit: Payer: Self-pay

## 2021-11-08 ENCOUNTER — Ambulatory Visit (INDEPENDENT_AMBULATORY_CARE_PROVIDER_SITE_OTHER): Payer: BC Managed Care – PPO | Admitting: Physician Assistant

## 2021-11-08 VITALS — BP 117/63 | HR 93 | Temp 97.8°F | Resp 18 | Ht 66.0 in | Wt 102.0 lb

## 2021-11-08 DIAGNOSIS — I739 Peripheral vascular disease, unspecified: Secondary | ICD-10-CM

## 2021-11-08 DIAGNOSIS — I7409 Other arterial embolism and thrombosis of abdominal aorta: Secondary | ICD-10-CM

## 2021-11-08 NOTE — Progress Notes (Signed)
Office Note     CC:  follow up Requesting Provider:  Lenoria Chime, MD  HPI: Melissa Chaney is a 56 y.o. (06-Jun-1966) female who presents for follow up s/p Aortobifemoral bypass on 09/25/21 by Dr. [name] and Dr. Carlis Abbott. This was performed for ischemic rest pain. Post operatively she did well and was discharged home post operatively day 5. She returned to ED on 10/13/21 with complaints of swelling in right groin. She was found to have right groin seroma. Conservative management was recommended.  Today she presents for follow up with ABI. She reports that overall she has been doing very well. Able to do her ADL's without any issues. Still with some midline tenderness at times along incision and some right groin tenderness. She feels that the seroma is slowly getting smaller. She has not noticed any redness. She denies any claudication, rest pain or tissue loss.   The pt is on a statin for cholesterol management.  The pt is on a daily aspirin.   Other AC:  none The pt is not on medication for hypertension.   The pt is not diabetic.  Tobacco hx:  current smoker  Past Medical History:  Diagnosis Date   Arthritis    Crohn disease ([place])    Depression    Headache    PAD (peripheral artery disease) (HCC)     Past Surgical History:  Procedure Laterality Date   ABDOMINAL SURGERY     Patient states part of intestine was removed   AORTA - BILATERAL FEMORAL ARTERY BYPASS GRAFT Bilateral 10/05/2021   Procedure: AORTOBIFEMORAL BYPASS GRAFT AND LYSIS OF ADHESION;  Surgeon: Cherre Robins, MD;  Location: [facility] OR;  Service: Vascular;  Laterality: Bilateral;    Social History   Socioeconomic History   Marital status: Married    Spouse name: Not on file   Number of children: 2   Years of education: Not on file   Highest education level: Not on file  Occupational History   Occupation: Secretary/administrator  Tobacco Use   Smoking status: Every Day    Packs/day: 0.50    Years: 40.00    Pack years:  20.00    Types: Cigarettes   Smokeless tobacco: Never  Vaping Use   Vaping Use: Never used  Substance and Sexual Activity   Alcohol use: Yes    Comment: occ   Drug use: No   Sexual activity: Not on file  Other Topics Concern   Not on file  Social History Narrative   Not on file   Social Determinants of Health   Financial Resource Strain: Not on file  Food Insecurity: Not on file  Transportation Needs: Not on file  Physical Activity: Not on file  Stress: Not on file  Social Connections: Not on file  Intimate Partner Violence: Not on file    Family History  Problem Relation Age of Onset   Crohn's disease Mother    Hyperlipidemia Father    Heart failure Father    Heart attack Father 38   Crohn's disease Father    Crohn's disease Sister    Ovarian cancer Maternal Grandmother    Diabetes Paternal Grandmother    Diabetes Son     Current Outpatient Medications  Medication Sig Dispense Refill   acetaminophen (TYLENOL) 500 MG tablet Take 500 mg by mouth every 6 (six) hours as needed for moderate pain.     aspirin 325 MG EC tablet Take 1 tablet (325 mg total) by mouth  daily. 90 tablet 3   atorvastatin (LIPITOR) 40 MG tablet Take 1 tablet (40 mg total) by mouth daily. 90 tablet 3   buPROPion (WELLBUTRIN XL) 150 MG 24 hr tablet TAKE 1 TABLET BY MOUTH EVERY DAY (Patient taking differently: 150 mg daily.) 90 tablet 1   diclofenac Sodium (VOLTAREN) 1 % GEL Apply 4 g topically 4 (four) times daily. As needed for hip pain 50 g 2   oxyCODONE-acetaminophen (PERCOCET/ROXICET) 5-325 MG tablet Take 1 tablet by mouth every 6 (six) hours as needed for moderate pain. (Patient not taking: Reported on 11/08/2021) 20 tablet 0   No current facility-administered medications for this visit.    Allergies  Allergen Reactions   Codeine Itching     REVIEW OF SYSTEMS:  [X]  denotes positive finding, [ ]  denotes negative finding Cardiac  Comments:  Chest pain or chest pressure:    Shortness of  breath upon exertion:    Short of breath when lying flat:    Irregular heart rhythm:        Vascular    Pain in calf, thigh, or hip brought on by ambulation:    Pain in feet at night that wakes you up from your sleep:     Blood clot in your veins:    Leg swelling:         Pulmonary    Oxygen at home:    Productive cough:     Wheezing:         Neurologic    Sudden weakness in arms or legs:     Sudden numbness in arms or legs:     Sudden onset of difficulty speaking or slurred speech:    Temporary loss of vision in one eye:     Problems with dizziness:         Gastrointestinal    Blood in stool:     Vomited blood:         Genitourinary    Burning when urinating:     Blood in urine:        Psychiatric    Major depression:         Hematologic    Bleeding problems:    Problems with blood clotting too easily:        Skin    Rashes or ulcers:        Constitutional    Fever or chills:      PHYSICAL EXAMINATION:  Vitals:   11/08/21 1139  BP: 117/63  Pulse: 93  Resp: 18  Temp: 97.8 F (36.6 C)  TempSrc: Oral  SpO2: 100%  Weight: 102 lb (46.3 kg)  Height: 5' 6"  (1.676 m)    General:  WDWN in NAD; vital signs documented above Gait: Normal HENT: WNL, normocephalic Pulmonary: normal non-labored breathing , without wheezing Cardiac: regular HR, without  Murmurs without carotid bruit Abdomen: flat, soft, NT, no masses. Incision is healing very well Vascular Exam/Pulses:  Right Left  Radial 2+ (normal) 2+ (normal)  Femoral 2+ (normal) 2+ (normal)  Popliteal Not palpable Not palpable  DP 2+ (normal) 1+ (weak)  PT 1+ (weak) 1+ (weak)   Extremities: without ischemic changes, without Gangrene , without cellulitis; without open wounds;  Musculoskeletal: no muscle wasting or atrophy  Neurologic: A&O X 3;  No focal weakness or paresthesias are detected Psychiatric:  The pt has Normal affect.   Non-Invasive Vascular Imaging:    +-------+-----------+-----------+------------+-----------------+   ABI/TBI Today's ABI Today's TBI Previous ABI Previous TBI        +-------+-----------+-----------+------------+-----------------+  Right   0.97        0.80        0.32         too low amplitude   +-------+-----------+-----------+------------+-----------------+   Left    0.94        0.56        0.33         0.10                +-------+-----------+-----------+------------+-----------------+    ASSESSMENT/PLAN:: 56 y.o. female here for follow up s/p Aortobifemoral bypass on 09/25/21 by Dr. [name] and Dr. Carlis Abbott. This was performed for ischemic rest pain. She is doing excellent post operatively. Her rest pain is completely resolved. No claudication or tissue loss. Her incisions are all healing very well. Her right groin has a stable seroma which is slowly improving. No signs of infection. Recommend that she continue to observe it for any signs of infection and call immediately if she notices any. Her ABI's today improved dramatically post operatively. I have encouraged her to remain active.  She will continue her Aspirin and Statin.  - She will follow up in 3 months with ABI - She knows to call sooner if she has any new or concerning symptoms   Karoline Caldwell, PA-C [facility] [phone]  Clinic MD:   Roxanne Mins

## 2021-11-09 ENCOUNTER — Other Ambulatory Visit: Payer: Self-pay

## 2021-11-09 DIAGNOSIS — I7409 Other arterial embolism and thrombosis of abdominal aorta: Secondary | ICD-10-CM

## 2021-12-12 NOTE — Progress Notes (Signed)
° ° °  SUBJECTIVE:   CHIEF COMPLAINT / HPI:   Severe PAD s/p aortobifemoral bypass on 61/44/31- complicated by groin seroma, plan to follow up in 3 month for repeat ABI, continue aspirin and statin.  No longer having exercise or rest pain.  Depression- doing well on Wellbutrin 11m daily. Able to walk and do more since surgery for her legs. Loves to garden.  Tobacco use- quit Nov 2022. Was using patches but made her want to smoke more. Feeling better now that quit smoking.   Skin spot- popped up two months ago, itches and she picks at it. Not noticed before. Wanting it removed.  HCM Mammogram- due COVID vaccine, shingles vaccine  PERTINENT  PMH / PSH: severe PAD, depression,   OBJECTIVE:   BP (!) 124/58    Pulse 97    Ht 5' 6"  (1.676 m)    Wt 109 lb (49.4 kg)    SpO2 100%    BMI 17.59 kg/m   General: A&O, NAD HEENT: No sign of trauma, EOM grossly intact Cardiac: RRR, no m/r/g Respiratory: CTAB, normal WOB, no w/c/r GI: Soft, NTTP, non-distended  Extremities: NTTP, no peripheral edema. Skin: small 0.5cm pedunculated xerotic lesion, with hard crust, no bleeding noted, on left forearm (see media tab) Neuro: Normal gait, moves all four extremities appropriately. Psych: Appropriate mood and affect   ASSESSMENT/PLAN:   Severe peripheral arterial disease ([place] - Status post aortofemoral bypass in November 2022, continue aspirin and statin  Adjustment disorder - continue Wellbutrin 1519mdaily  Former tobacco use Quit in November 2022, congratulated patient!  Skin lesion of left upper extremity - concerned for inflamed skin tag versus squamous cell carcinoma, consented for shave biopsy and sent for pathology, patient tolerated procedure well  Procedure: Timeout performed.  PRE-OP DIAGNOSIS: Skin lesion of left upper extremity POST-OP DIAGNOSIS: Same  PROCEDURE: skin biopsy Performing Physician: MaYehuda SavannahD  PROCEDURE:  x Shave Biopsy        _  Excisional Biopsy         _  Punch (Size _)  The area surrounding the skin lesion was prepared and draped in the  usual sterile manner. The area was numbed with lidocaine with epinephrine, and lesion was removed in the usual manner by the  biopsy method noted above. Hemostasis was assured. The patient tolerated  the procedure well. Tissue sent to pathology.  Closure:   x None  Followup: The patient tolerated the procedure well without  complications.  Standard post-procedure care is explained and return  precautions are given.    Given number to call for mammogram.  Offered COVID vaccine, declined today.  MaLenoria ChimeMD [employer]

## 2021-12-16 ENCOUNTER — Encounter: Payer: Self-pay | Admitting: Family Medicine

## 2021-12-16 ENCOUNTER — Other Ambulatory Visit: Payer: Self-pay

## 2021-12-16 ENCOUNTER — Ambulatory Visit: Payer: BC Managed Care – PPO | Admitting: Family Medicine

## 2021-12-16 VITALS — BP 124/58 | HR 97 | Ht 66.0 in | Wt 109.0 lb

## 2021-12-16 DIAGNOSIS — I739 Peripheral vascular disease, unspecified: Secondary | ICD-10-CM

## 2021-12-16 DIAGNOSIS — L570 Actinic keratosis: Secondary | ICD-10-CM | POA: Diagnosis not present

## 2021-12-16 DIAGNOSIS — L989 Disorder of the skin and subcutaneous tissue, unspecified: Secondary | ICD-10-CM | POA: Diagnosis not present

## 2021-12-16 DIAGNOSIS — Z87891 Personal history of nicotine dependence: Secondary | ICD-10-CM

## 2021-12-16 DIAGNOSIS — F4329 Adjustment disorder with other symptoms: Secondary | ICD-10-CM

## 2021-12-16 NOTE — Assessment & Plan Note (Signed)
-   continue Wellbutrin 162m daily

## 2021-12-16 NOTE — Assessment & Plan Note (Signed)
Quit in November 2022, congratulated patient!

## 2021-12-16 NOTE — Assessment & Plan Note (Signed)
-   Status post aortofemoral bypass in November 2022, continue aspirin and statin

## 2021-12-16 NOTE — Assessment & Plan Note (Signed)
-   concerned for inflamed skin tag versus squamous cell carcinoma, consented for shave biopsy and sent for pathology, patient tolerated procedure well

## 2021-12-16 NOTE — Patient Instructions (Addendum)
It was wonderful to see you today.  Please bring ALL of your medications with you to every visit.   Today we talked about:    [facility]- number to call [phone] option 1  We shaved the lesion on your arm today and I will call you with results  Thank you for choosing [facility].   Please call [phone] with any questions about today's appointment.  Please be sure to schedule follow up at the front  desk before you leave today.   Please arrive at least 15 minutes prior to your scheduled appointments.   If you had blood work today, I will send you a MyChart message or a letter if results are normal. Otherwise, I will give you a call.   If you had a referral placed, they will call you to set up an appointment. Please give [place] a call if you don't hear back in the next 2 weeks.   If you need additional refills before your next appointment, please call your pharmacy first.   Yehuda Savannah, MD  Family Medicine

## 2022-01-16 ENCOUNTER — Encounter: Payer: Self-pay | Admitting: Nurse Practitioner

## 2022-01-16 ENCOUNTER — Ambulatory Visit: Payer: BC Managed Care – PPO | Admitting: Nurse Practitioner

## 2022-01-16 ENCOUNTER — Other Ambulatory Visit: Payer: Self-pay

## 2022-01-16 ENCOUNTER — Other Ambulatory Visit (INDEPENDENT_AMBULATORY_CARE_PROVIDER_SITE_OTHER): Payer: BC Managed Care – PPO

## 2022-01-16 VITALS — BP 116/66 | HR 109 | Resp 99 | Ht 66.0 in | Wt 112.4 lb

## 2022-01-16 DIAGNOSIS — D126 Benign neoplasm of colon, unspecified: Secondary | ICD-10-CM

## 2022-01-16 DIAGNOSIS — D649 Anemia, unspecified: Secondary | ICD-10-CM

## 2022-01-16 DIAGNOSIS — K50919 Crohn's disease, unspecified, with unspecified complications: Secondary | ICD-10-CM | POA: Diagnosis not present

## 2022-01-16 DIAGNOSIS — R1033 Periumbilical pain: Secondary | ICD-10-CM

## 2022-01-16 DIAGNOSIS — R1011 Right upper quadrant pain: Secondary | ICD-10-CM

## 2022-01-16 LAB — CBC WITH DIFFERENTIAL/PLATELET
Basophils Absolute: 0.1 10*3/uL (ref 0.0–0.1)
Basophils Relative: 1 % (ref 0.0–3.0)
Eosinophils Absolute: 0.1 10*3/uL (ref 0.0–0.7)
Eosinophils Relative: 1.1 % (ref 0.0–5.0)
HCT: 39.6 % (ref 36.0–46.0)
Hemoglobin: 13 g/dL (ref 12.0–15.0)
Lymphocytes Relative: 24.7 % (ref 12.0–46.0)
Lymphs Abs: 1.5 10*3/uL (ref 0.7–4.0)
MCHC: 32.8 g/dL (ref 30.0–36.0)
MCV: 92.9 fl (ref 78.0–100.0)
Monocytes Absolute: 0.3 10*3/uL (ref 0.1–1.0)
Monocytes Relative: 5.2 % (ref 3.0–12.0)
Neutro Abs: 4.2 10*3/uL (ref 1.4–7.7)
Neutrophils Relative %: 68 % (ref 43.0–77.0)
Platelets: 193 10*3/uL (ref 150.0–400.0)
RBC: 4.26 Mil/uL (ref 3.87–5.11)
RDW: 17.1 % — ABNORMAL HIGH (ref 11.5–15.5)
WBC: 6.2 10*3/uL (ref 4.0–10.5)

## 2022-01-16 LAB — COMPREHENSIVE METABOLIC PANEL
ALT: 26 U/L (ref 0–35)
AST: 25 U/L (ref 0–37)
Albumin: 4.6 g/dL (ref 3.5–5.2)
Alkaline Phosphatase: 90 U/L (ref 39–117)
BUN: 14 mg/dL (ref 6–23)
CO2: 31 mEq/L (ref 19–32)
Calcium: 10.2 mg/dL (ref 8.4–10.5)
Chloride: 98 mEq/L (ref 96–112)
Creatinine, Ser: 1.03 mg/dL (ref 0.40–1.20)
GFR: 60.62 mL/min (ref >60.00)
Glucose, Bld: 104 mg/dL — ABNORMAL HIGH (ref 70–99)
Potassium: 4.1 mEq/L (ref 3.5–5.1)
Sodium: 138 mEq/L (ref 135–145)
Total Bilirubin: 0.6 mg/dL (ref 0.2–1.2)
Total Protein: 7.7 g/dL (ref 6.0–8.3)

## 2022-01-16 LAB — VITAMIN D 25 HYDROXY (VIT D DEFICIENCY, FRACTURES): VITD: 10.76 ng/mL — ABNORMAL LOW (ref 30.00–100.00)

## 2022-01-16 MED ORDER — NA SULFATE-K SULFATE-MG SULF 17.5-3.13-1.6 GM/177ML PO SOLN: 1.0000 | ORAL | 0 refills | Status: DC

## 2022-01-16 NOTE — Progress Notes (Signed)
Orders placed for CT Enterography w/ following notes added: compare new CT enterography result to prior CT enterography in 2018. Message sent to [facility] Scheduling. Reminder created to f/u to ensure pt has been scheduled in a timley manner. ?

## 2022-01-16 NOTE — Patient Instructions (Addendum)
You have been scheduled for a colonoscopy. Please follow the written instructions given to you at your visit today. ?Please pick up your prep supplies at the pharmacy within the next 1-3 days. ?If you use inhalers (even only as needed), please bring them with you on the day of your procedure. ? ?Further recommendations to be determined after colonoscopy. ? ?Please contact our office if Right lower abdominal pain worsen. ? ? ?Please proceed to the basement level for lab work before leaving today. Press "B" on the elevator. The lab is located at the first door on the left as you exit the elevator. ? ?HEALTHCARE LAWS AND MY CHART RESULTS:  ? ?Due to recent changes in healthcare laws, you may see results of your imaging and/or laboratory studies on MyChart before I have had a chance to review them.  I understand that in some cases there may be results that are confusing or concerning to you. Please understand that not all results are received at the same time and often I may need to interpret multiple results in order to provide you with the best plan of care or course of treatment. Therefore, I ask that you please give me 48 hours to thoroughly review all your results before contacting my office for clarification.  ? ?Thank you for trusting me with your gastrointestinal care!   ? ?Noralyn Pick, CRNP ? ? ? ?BMI: ? ?If you are age 26 or older, your body mass index should be between 23-30. Your Body mass index is 18.14 kg/m?Marland Kitchen If this is out of the aforementioned range listed, please consider follow up with your Primary Care Provider. ? ?If you are age 13 or younger, your body mass index should be between 19-25. Your Body mass index is 18.14 kg/m?Marland Kitchen If this is out of the aformentioned range listed, please consider follow up with your Primary Care Provider.  ? ?MY CHART: ? ?The [facility] providers would like to encourage you to use [facility] to communicate with providers for non-urgent requests or questions.  Due  to long hold times on the telephone, sending your provider a message by [facility] may be a faster and more efficient way to get a response.  Please allow 48 business hours for a response.  Please remember that this is for non-urgent requests.  ? ?

## 2022-01-16 NOTE — Progress Notes (Signed)
01/16/2022 Melissa Chaney [id number] 1966-04-24   CHIEF COMPLAINT: Crohn's follow-up  HISTORY OF PRESENT ILLNESS: Melissa Chaney is a 5 with a past medical history of arthritis, depression, peripheral arterial disease s/p aortobifemoral bypass with lysis of adhesions on 09/25/21 with a post op right groin seroma, tubular adenomatous colon polyp and Crohn's disease s/p ileocolectomy 08/2005 and a s/p small bowel resection secondary to SBO 09/2005.  She was restarted  on 6 Mercaptopurine and Humira  was initiated in 2017.  She was last seen in office by [name] 07/19/2018, at that time, her Crohn's disease was stable and she was instructed to continue Humira and 6-Mercaptopurine with instructions to follow-up in the office in 6 months and it was discussed her next colonoscopy was due around 2022.  She has not been seen in our office since then.  She presents to our office today as referred by [name] for further Crohn's follow-up.  She previously lost her insurance and she was unemployed for extended period of time therefore she was unable to continue Humira and 6-MP, last doses were taken around 12/2020.  She has health insurance at this time and wishes to reestablish her Crohn's management by [name].  She has intermittent right mid to RLQ pain which she stated is her chronic Crohn's related pain, no significant abdominal pain.  She experiences some RLQ pain 2 to 3 days weekly.  She is passing normal formed brown bowel movement most days.  No rectal bleeding or black stools.  No nausea or vomiting.  No dysphagia or heartburn.  She endorses having a decreased appetite without associated weight loss.  She is on ASA 325 mg once daily as prescribed by her vascular surgeon as she has peripheral arterial disease s/p aortobifemoral bypass on 09/25/21.  No other NSAID use.  She uses Tylenol as needed for any pain.  She stopped smoking cigarettes after her arterial bifemoral bypass surgery.  No  other complaints at this time.   CBC Latest Ref Rng & Units 10/13/2021 10/10/2021 10/09/2021  WBC 4.0 - 10.5 K/uL 7.6 7.8 7.9  Hemoglobin 12.0 - 15.0 g/dL 10.8(L) 9.0(L) 7.8(L)  Hematocrit 36.0 - 46.0 % 31.5(L) 27.4(L) 23.2(L)  Platelets 150 - 400 K/uL 281 167 118(L)    CMP Latest Ref Rng & Units 10/13/2021 10/10/2021 10/09/2021  Glucose 70 - 99 mg/dL 119(H) 112(H) 136(H)  BUN 6 - 20 mg/dL 8 5(L) <5(L)  Creatinine 0.44 - 1.00 mg/dL 0.78 0.59 0.46  Sodium 135 - 145 mmol/L 133(L) 132(L) 133(L)  Potassium 3.5 - 5.1 mmol/L 3.5 3.9 4.3  Chloride 98 - 111 mmol/L 99 99 100  CO2 22 - 32 mmol/L 24 25 30   Calcium 8.9 - 10.3 mg/dL 8.9 8.3(L) 7.8(L)  Total Protein 6.5 - 8.1 g/dL - - -  Total Bilirubin 0.3 - 1.2 mg/dL - - -  Alkaline Phos 38 - 126 U/L - - -  AST 15 - 41 U/L - - -  ALT 0 - 44 U/L - - -    CT enterography with contrast 03/21/2017: FINDINGS: Lower chest: No acute findings.A 4 mm nodule of the right lung base is unchanged from previous exam.   Hepatobiliary: There is no focal liver abnormality identified. The gallbladder appears normal. No biliary dilatation identified.   Pancreas: No mass, inflammatory changes, or other significant abnormality.   Spleen: Within normal limits in size and appearance.   Adrenals/Urinary Tract: The adrenal glands appear normal. The kidneys  are unremarkable. No mass or hydronephrosis identified. The urinary bladder appears within normal limits.   Stomach/Bowel: The stomach appears within normal limits. No pathologic dilatation of the small bowel loops. Several small areas of mucosal hyperenhancement within the terminal ileum or neo terminal ileum is identified, image 25 of series 7. This appears improved when compared with study from 08/16/2016.   Status post partial right hemicolectomy with enterocolonic anastomosis. Recurrent or persistent mucosal hyperenhancement with minimal colonic wall thickening is again noted. This is most pronounced  distally, for example within the sigmoid colon image 52 of series 7. When compared with 08/16/2016 the degree of colonic wall thickening and mucosal hyperenhancement appears improved.   There is no evidence for penetrating disease, abscess formation or bowel obstruction.   Vascular/Lymphatic: Aortic atherosclerosis. No aneurysm. No upper abdominal adenopathy. No pelvic or inguinal adenopathy.   Reproductive: No mass or other significant abnormality.   Other: No free fluid or fluid collections identified.   Musculoskeletal: No aggressive lytic or sclerotic bone lesions.   IMPRESSION: 1. Active colitis is again noted within the sigmoid colon and rectum, likely related to the clinical history of Crohn's disease. Allowing for differences in technique this is minimally improved when compared with 08/16/2016. 2. Mild inflammatory changes involving the terminal ileum or neo terminal ileum evident by mucosal hyperenhancement without significant bowel wall thickening. Also improved from previous exam. 3. No complicating features identified. Specifically no penetrating disease, abscess or bowel obstruction. 4.  Aortic Atherosclerosis  Colonoscopy 08/23/2016: 1. Status post ileocecectomy. Widely patent anastomosis 2. Ulceration on the colonic side of the anastomosis 3. Significant active ileal Crohn's disease 4. No obvious colonic disease. Rectosigmoid region looks fine. ULCERATED BENIGN SMALL BOWEL MUCOSA SHOWING ACTIVE ILEITIS. - NO DYSPLASIA OR MALIGNANCY IDENTIFIED.  Colonoscopy 09/29/2008: Active ileal disease - CHRONIC MILDLY ACTIVE ILEITIS.  Colonoscopy 01/12/2005: Diminutive tubular adenomatous polyp removed from the transverse colon Ileum, ulcers present, stenosis, ulcerated.   Comments: Extended segment with ulceration with edema and stenosis.  Colonoscopy 03/23/1999: Ileal Crohn's with ulceration   Past Medical History:  Diagnosis Date   Arthritis    Crohn disease  ([place])    Depression    Headache    PAD (peripheral artery disease) ([place])    Past Surgical History:  Procedure Laterality Date   ABDOMINAL SURGERY     Patient states part of intestine was removed   AORTA - BILATERAL FEMORAL ARTERY BYPASS GRAFT Bilateral 10/05/2021   Procedure: AORTOBIFEMORAL BYPASS GRAFT AND LYSIS OF ADHESION;  Surgeon: Cherre Robins, MD;  Location: [facility] OR;  Service: Vascular;  Laterality: Bilateral;   Social History: She is married.  Currently unemployed.  She has 1 daughter and 1 son.  Smoked cigarettes 1ppd x 40+ years, quit smoking 09/2021. No alcohol use. No drug use.   Family History: family history includes Crohn's disease in her father, mother, and sister; Diabetes in her paternal grandmother and son; Heart attack (age of onset: 30) in her father; Heart failure in her father; Hyperlipidemia in her father; Ovarian cancer in her maternal grandmother.  Allergies  Allergen Reactions   Codeine Itching      Outpatient Encounter Medications as of 01/16/2022  Medication Sig   acetaminophen (TYLENOL) 500 MG tablet Take 500 mg by mouth every 6 (six) hours as needed for moderate pain.   aspirin 325 MG EC tablet Take 1 tablet (325 mg total) by mouth daily.   atorvastatin (LIPITOR) 40 MG tablet Take 1 tablet (40 mg total) by mouth  daily.   buPROPion (WELLBUTRIN XL) 150 MG 24 hr tablet TAKE 1 TABLET BY MOUTH EVERY DAY (Patient taking differently: 150 mg daily.)   diclofenac Sodium (VOLTAREN) 1 % GEL Apply 4 g topically 4 (four) times daily. As needed for hip pain   oxyCODONE-acetaminophen (PERCOCET/ROXICET) 5-325 MG tablet Take 1 tablet by mouth every 6 (six) hours as needed for moderate pain. (Patient not taking: Reported on 11/08/2021)   No facility-administered encounter medications on file as of 01/16/2022.    REVIEW OF SYSTEMS:  Gen: Denies fever, sweats or chills. No weight loss.  CV: Denies chest pain, palpitations or edema. Resp: Denies cough, shortness of breath  of hemoptysis.  GI: See HPI. GU : Denies urinary burning, blood in urine, increased urinary frequency or incontinence. MS: + Arthritis, back pain. Derm: Denies rash, itchiness, skin lesions or unhealing ulcers. Psych: + Depression.  Heme: Denies bruising, easy bleeding. Neuro:  + Headaches.  Endo:  Denies any problems with DM, thyroid or adrenal function.  PHYSICAL EXAM: BP 116/66    Pulse (!) 109    Resp (!) 99    Ht 5' 6"  (1.676 m)    Wt 112 lb 6.4 oz (51 kg)    BMI 18.14 kg/m   General: 56 year old female in no acute distress. Head: Normocephalic and atraumatic. Eyes:  Sclerae non-icteric, conjunctive pink. Ears: Normal auditory acuity. Mouth: Upper dentures, absent lower dentition no ulcers or lesions.  Neck: Supple, no lymphadenopathy or thyromegaly.  Lungs: Clear bilaterally to auscultation without wheezes, crackles or rhonchi. Heart: Regular rate and rhythm. No murmur, rub or gallop appreciated.  Abdomen: Soft, non distended.  Mild RLQ tenderness without rebound or guarding.  No masses. No hepatosplenomegaly. Normoactive bowel sounds x 4 quadrants.  Extensive midline scar intact. Rectal: Deferred. Musculoskeletal: Symmetrical with no gross deformities. Skin: Warm and dry. No rash or lesions on visible extremities. Extremities: No edema. Neurological: Alert oriented x 4, no focal deficits.  Psychological:  Alert and cooperative. Normal mood and affect.  ASSESSMENT AND PLAN:  73) 56 year old female with Crohn's disease s/p ileocolectomy 08/2005 and s/p small bowel resection secondary to SBO 09/2005. Off 6MP and Humira since 12/2020 due to loss of health insurance. Chronic right mid to RLQ pain.  Passing normal bowel movements.  -CBC, CMP, CRP, iron panel and vitamin D. Add Quantiferon gold level to lab order.  -Colonoscopy benefits and risks discussed including risk with sedation, risk of bleeding, perforation and infection  -Consider small bowel CT enterography to re-evaluate  her small bowel for active Crohn's disease, to discuss further with [name]  -Await colonoscopy biopsy results prior to reinitiating 6-MP and Humira.  If Humira restarted , antibody levels will need to be obtained within a few months as she is at risk for Humira antibody formation secondary to her lapse in treatment.  2) History of a small tubular adenomatous colon polyp per colonoscopy in 2006 -Colonoscopy as ordered above  3) Peripheral arterial disease s/p aortobifemoral bypass on 09/25/21 on ASA 335m QD. Patient quit smoking cigarettes 09/2021.          CC:  PLenoria Chime MD

## 2022-01-16 NOTE — Addendum Note (Signed)
Addended by: Cardell Peach I on: 01/16/2022 01:28 PM ? ? Modules accepted: Orders ? ?

## 2022-01-16 NOTE — Progress Notes (Signed)
[place]imme, Pls contact patient and let her know I consulted with [name] and he did recommend small bowel imaging. Pls schedule her for a CT enterography with contrast. Pls have radiologist compare new CT enterography result to prior CT enterography in 2018. THX  ?

## 2022-01-16 NOTE — Progress Notes (Signed)
Melissa Chaney, ?Assessment and plan reviewed. ?Patient with complicated Crohn's disease.  Has had varying degrees of medical noncompliance over the years for various reasons.  Now reestablishing. ?Agree with plans for colonoscopy ?Would proceed with CT enterography or MR enterography to assess for small bowel. ?Thanks, ?Dr.  Henrene Pastor ?

## 2022-01-17 LAB — IRON,TIBC AND FERRITIN PANEL
%SAT: 18 % (calc) (ref 16–45)
Ferritin: 121 ng/mL (ref 16–232)
Iron: 68 ug/dL (ref 45–160)
TIBC: 378 mcg/dL (calc) (ref 250–450)

## 2022-01-18 ENCOUNTER — Other Ambulatory Visit: Payer: Self-pay

## 2022-01-18 DIAGNOSIS — R7989 Other specified abnormal findings of blood chemistry: Secondary | ICD-10-CM

## 2022-01-18 MED ORDER — VITAMIN D3 1.25 MG (50000 UT) PO CAPS: 1.0000 | ORAL_CAPSULE | ORAL | 0 refills | Status: DC

## 2022-02-06 NOTE — Progress Notes (Signed)
[facility] [place] ?PROGRESS NOTE ? ?ASSESSMENT / PLAN: ?Melissa Chaney is a 56 y.o. female status post aortobifemoral bypass 10/05/22 for rest pain. The patient continues to do well. Postoperative seroma in the right groin is smaller than previous. I encouraged conservative management for seroma. Will see her annually with ABI for surveillance.  ? ?SUBJECTIVE: ?Doing well. Seroma persists, but is smaller. Not very bothersome to her. She is walking as far as she likes. She looks much better. She has continued to avoid cigarettes.  ? ?OBJECTIVE: ?BP 123/64 (BP Location: Left Arm, Patient Position: Sitting, Cuff Size: Normal)   Pulse 68   Temp 98.8 ?F (37.1 ?C)   Resp 20   Ht 5' 6"  (1.676 m)   Wt 110 lb (49.9 kg)   SpO2 98%   BMI 17.75 kg/m?  ? ?No acute distress ?Regular rate and rhythm ?Unlabored breathing ?Soft, non-tender abdomen ?Right groin seroma appears smaller. No evidence of infection. ?No palpable pedal pulses. ? ? ?  Latest Ref Rng & Units 01/16/2022  ? 12:18 PM 10/13/2021  ?  9:44 PM 10/10/2021  ?  1:22 AM  ?CBC  ?WBC 4.0 - 10.5 K/uL 6.2   7.6   7.8    ?Hemoglobin 12.0 - 15.0 g/dL 13.0   10.8   9.0    ?Hematocrit 36.0 - 46.0 % 39.6   31.5   27.4    ?Platelets 150.0 - 400.0 K/uL 193.0   281   167    ?  ? ? ?  Latest Ref Rng & Units 01/16/2022  ? 12:18 PM 10/13/2021  ?  9:44 PM 10/10/2021  ?  1:22 AM  ?CMP  ?Glucose 70 - 99 mg/dL 104   119   112    ?BUN 6 - 23 mg/dL 14   8   5     ?Creatinine 0.40 - 1.20 mg/dL 1.03   0.78   0.59    ?Sodium 135 - 145 mEq/L 138   133   132    ?Potassium 3.5 - 5.1 mEq/L 4.1   3.5   3.9    ?Chloride 96 - 112 mEq/L 98   99   99    ?CO2 19 - 32 mEq/L 31   24   25     ?Calcium 8.4 - 10.5 mg/dL 10.2   8.9   8.3    ?Total Protein 6.0 - 8.3 g/dL 7.7      ?Total Bilirubin 0.2 - 1.2 mg/dL 0.6      ?Alkaline Phos 39 - 117 U/L 90      ?AST 0 - 37 U/L 25      ?ALT 0 - 35 U/L 26      ? ? ?+-------+-----------+-----------+------------+------------+  ?ABI/TBIToday's  ABIToday's TBIPrevious ABIPrevious TBI  ?+-------+-----------+-----------+------------+------------+  ?Right  0.98       0.83       0.97        0.80          ?+-------+-----------+-----------+------------+------------+  ?Left   1.02       0.65       0.94        0.56          ?+-------+-----------+-----------+------------+------------+  ? ?[name]. [name], MD ?[facility] [place] ?Office Phone Number: [phone] ?02/07/2022 11:04 AM ? ? ? ?

## 2022-02-07 ENCOUNTER — Encounter: Payer: Self-pay | Admitting: Vascular Surgery

## 2022-02-07 ENCOUNTER — Ambulatory Visit (HOSPITAL_COMMUNITY)
Admission: RE | Admit: 2022-02-07 | Discharge: 2022-02-07 | Disposition: A | Discharge status: Home / Self Care | Payer: BC Managed Care – PPO | Source: Ambulatory Visit | Attending: Vascular Surgery | Admitting: Vascular Surgery

## 2022-02-07 ENCOUNTER — Ambulatory Visit: Payer: BC Managed Care – PPO | Admitting: Vascular Surgery

## 2022-02-07 VITALS — BP 123/64 | HR 68 | Temp 98.8°F | Resp 20 | Ht 66.0 in | Wt 110.0 lb

## 2022-02-07 DIAGNOSIS — I7409 Other arterial embolism and thrombosis of abdominal aorta: Secondary | ICD-10-CM | POA: Insufficient documentation

## 2022-02-07 DIAGNOSIS — Z95828 Presence of other vascular implants and grafts: Secondary | ICD-10-CM | POA: Diagnosis not present

## 2022-02-16 ENCOUNTER — Ambulatory Visit (HOSPITAL_COMMUNITY)
Admission: RE | Admit: 2022-02-16 | Discharge: 2022-02-16 | Disposition: A | Discharge status: Home / Self Care | Payer: BC Managed Care – PPO | Source: Ambulatory Visit | Attending: Nurse Practitioner | Admitting: Nurse Practitioner

## 2022-02-16 DIAGNOSIS — K50919 Crohn's disease, unspecified, with unspecified complications: Secondary | ICD-10-CM | POA: Insufficient documentation

## 2022-02-16 DIAGNOSIS — R1011 Right upper quadrant pain: Secondary | ICD-10-CM | POA: Insufficient documentation

## 2022-02-16 DIAGNOSIS — R1033 Periumbilical pain: Secondary | ICD-10-CM | POA: Diagnosis not present

## 2022-02-16 DIAGNOSIS — R109 Unspecified abdominal pain: Secondary | ICD-10-CM | POA: Diagnosis not present

## 2022-02-16 MED ORDER — IOHEXOL 300 MG/ML  SOLN
100.0000 mL | Freq: Once | INTRAMUSCULAR | Status: AC | PRN
  Administered 2022-02-16: 100 mL via INTRAVENOUS

## 2022-02-16 MED ORDER — SODIUM CHLORIDE (PF) 0.9 % IJ SOLN
INTRAMUSCULAR | Status: AC
  Filled 2022-02-16: qty 50

## 2022-02-16 MED ORDER — BARIUM SULFATE 0.1 % PO SUSP: 1350.0000 mL | Freq: Once | ORAL | Status: DC

## 2022-02-16 MED ORDER — BARIUM SULFATE 0.1 % PO SUSP
ORAL | Status: AC
  Administered 2022-02-16: 1350 mL
  Filled 2022-02-16: qty 3

## 2022-02-23 ENCOUNTER — Encounter: Payer: Self-pay | Admitting: Internal Medicine

## 2022-02-25 ENCOUNTER — Other Ambulatory Visit: Payer: Self-pay | Admitting: Family Medicine

## 2022-02-25 DIAGNOSIS — F4321 Adjustment disorder with depressed mood: Secondary | ICD-10-CM

## 2022-03-02 ENCOUNTER — Ambulatory Visit (AMBULATORY_SURGERY_CENTER): Payer: BC Managed Care – PPO | Admitting: Internal Medicine

## 2022-03-02 ENCOUNTER — Encounter: Payer: Self-pay | Admitting: Internal Medicine

## 2022-03-02 VITALS — BP 118/52 | HR 63 | Temp 96.8°F | Resp 17 | Ht 66.0 in | Wt 112.0 lb

## 2022-03-02 DIAGNOSIS — K50919 Crohn's disease, unspecified, with unspecified complications: Secondary | ICD-10-CM | POA: Diagnosis not present

## 2022-03-02 MED ORDER — SODIUM CHLORIDE 0.9 % IV SOLN: 500.0000 mL | Freq: Once | INTRAVENOUS | Status: DC

## 2022-03-02 NOTE — Patient Instructions (Signed)
Discharge instructions given. ?Normal exam. ?Resume previous medications. ?Office will call to schedule follow up appointment with [name] in 4 to 6 weeks. ?YOU HAD AN ENDOSCOPIC PROCEDURE TODAY AT [facility]:   Refer to the procedure report that was given to you for any specific questions about what was found during the examination.  If the procedure report does not answer your questions, please call your gastroenterologist to clarify.  If you requested that your care partner not be given the details of your procedure findings, then the procedure report has been included in a sealed envelope for you to review at your convenience later. ? ?YOU SHOULD EXPECT: Some feelings of bloating in the abdomen. Passage of more gas than usual.  Walking can help get rid of the air that was put into your GI tract during the procedure and reduce the bloating. If you had a lower endoscopy (such as a colonoscopy or flexible sigmoidoscopy) you may notice spotting of blood in your stool or on the toilet paper. If you underwent a bowel prep for your procedure, you may not have a normal bowel movement for a few days. ? ?Please Note:  You might notice some irritation and congestion in your nose or some drainage.  This is from the oxygen used during your procedure.  There is no need for concern and it should clear up in a day or so. ? ?SYMPTOMS TO REPORT IMMEDIATELY: ? ?Following lower endoscopy (colonoscopy or flexible sigmoidoscopy): ? Excessive amounts of blood in the stool ? Significant tenderness or worsening of abdominal pains ? Swelling of the abdomen that is new, acute ? Fever of 100?F or higher ? ? ?For urgent or emergent issues, a gastroenterologist can be reached at any hour by calling [phone]. ?Do not use MyChart messaging for urgent concerns.  ? ? ?DIET:  We do recommend a small meal at first, but then you may proceed to your regular diet.  Drink plenty of fluids but you should avoid alcoholic  beverages for 24 hours. ? ?ACTIVITY:  You should plan to take it easy for the rest of today and you should NOT DRIVE or use heavy machinery until tomorrow (because of the sedation medicines used during the test).   ? ?FOLLOW UP: ?Our staff will call the number listed on your records 48-72 hours following your procedure to check on you and address any questions or concerns that you may have regarding the information given to you following your procedure. If we do not reach you, we will leave a message.  We will attempt to reach you two times.  During this call, we will ask if you have developed any symptoms of COVID 19. If you develop any symptoms (ie: fever, flu-like symptoms, shortness of breath, cough etc.) before then, please call [phone].  If you test positive for Covid 19 in the 2 weeks post procedure, please call and report this information to [place].   ? ?If any biopsies were taken you will be contacted by phone or by letter within the next 1-3 weeks.  Please call us at [phone] if you have not heard about the biopsies in 3 weeks.  ? ? ?SIGNATURES/CONFIDENTIALITY: ?You and/or your care partner have signed paperwork which will be entered into your electronic medical record.  These signatures attest to the fact that that the information above on your After Visit Summary has been reviewed and is understood.  Full responsibility of the confidentiality of this discharge information lies  with you and/or your care-partner.  ?

## 2022-03-02 NOTE — Progress Notes (Signed)
CHIEF COMPLAINT: Crohn's follow-up ?  ?HISTORY OF PRESENT ILLNESS: Melissa Chaney. Grieser is a 36 with a past medical history of arthritis, depression, peripheral arterial disease s/p aortobifemoral bypass with lysis of adhesions on 09/25/21 with a post op right groin seroma, tubular adenomatous colon polyp and Crohn's disease s/p ileocolectomy 08/2005 and a s/p small bowel resection secondary to SBO 09/2005.  She was restarted  on 6 Mercaptopurine and Humira  was initiated in 2017. ?  ?She was last seen in office by [name] 07/19/2018, at that time, her Crohn's disease was stable and she was instructed to continue Humira and 6-Mercaptopurine with instructions to follow-up in the office in 6 months and it was discussed her next colonoscopy was due around 2022.  She has not been seen in our office since then. ?  ?She presents to our office today as referred by [name] for further Crohn's follow-up.  She previously lost her insurance and she was unemployed for extended period of time therefore she was unable to continue Humira and 6-MP, last doses were taken around 12/2020.  She has health insurance at this time and wishes to reestablish her Crohn's management by [name].  She has intermittent right mid to RLQ pain which she stated is her chronic Crohn's related pain, no significant abdominal pain.  She experiences some RLQ pain 2 to 3 days weekly.  She is passing normal formed brown bowel movement most days.  No rectal bleeding or black stools.  No nausea or vomiting.  No dysphagia or heartburn.  She endorses having a decreased appetite without associated weight loss.  She is on ASA 325 mg once daily as prescribed by her vascular surgeon as she has peripheral arterial disease s/p aortobifemoral bypass on 09/25/21.  No other NSAID use.  She uses Tylenol as needed for any pain.  She stopped smoking cigarettes after her arterial bifemoral bypass surgery.  No other complaints at this time. ?  ?  ?CBC Latest Ref Rng  & Units 10/13/2021 10/10/2021 10/09/2021  ?WBC 4.0 - 10.5 K/uL 7.6 7.8 7.9  ?Hemoglobin 12.0 - 15.0 g/dL 10.8(L) 9.0(L) 7.8(L)  ?Hematocrit 36.0 - 46.0 % 31.5(L) 27.4(L) 23.2(L)  ?Platelets 150 - 400 K/uL 281 167 118(L)  ?  ?CMP Latest Ref Rng & Units 10/13/2021 10/10/2021 10/09/2021  ?Glucose 70 - 99 mg/dL 119(H) 112(H) 136(H)  ?BUN 6 - 20 mg/dL 8 5(L) <5(L)  ?Creatinine 0.44 - 1.00 mg/dL 0.78 0.59 0.46  ?Sodium 135 - 145 mmol/L 133(L) 132(L) 133(L)  ?Potassium 3.5 - 5.1 mmol/L 3.5 3.9 4.3  ?Chloride 98 - 111 mmol/L 99 99 100  ?CO2 22 - 32 mmol/L 24 25 30   ?Calcium 8.9 - 10.3 mg/dL 8.9 8.3(L) 7.8(L)  ?Total Protein 6.5 - 8.1 g/dL - - -  ?Total Bilirubin 0.3 - 1.2 mg/dL - - -  ?Alkaline Phos 38 - 126 U/L - - -  ?AST 15 - 41 U/L - - -  ?ALT 0 - 44 U/L - - -  ?  ?CT enterography with contrast 03/21/2017: ?FINDINGS: ?Lower chest: No acute findings.A 4 mm nodule of the right lung base ?is unchanged from previous exam. ?  ?Hepatobiliary: There is no focal liver abnormality identified. The ?gallbladder appears normal. No biliary dilatation identified. ?  ?Pancreas: No mass, inflammatory changes, or other significant ?abnormality. ?  ?Spleen: Within normal limits in size and appearance. ?  ?Adrenals/Urinary Tract: The adrenal glands appear normal. The ?kidneys are unremarkable. No mass or hydronephrosis  identified. The ?urinary bladder appears within normal limits. ?  ?Stomach/Bowel: The stomach appears within normal limits. No ?pathologic dilatation of the small bowel loops. Several small areas ?of mucosal hyperenhancement within the terminal ileum or neo ?terminal ileum is identified, image 25 of series 7. This appears ?improved when compared with study from 08/16/2016. ?  ?Status post partial right hemicolectomy with enterocolonic ?anastomosis. Recurrent or persistent mucosal hyperenhancement with ?minimal colonic wall thickening is again noted. This is most ?pronounced distally, for example within the sigmoid colon image  52 ?of series 7. When compared with 08/16/2016 the degree of colonic ?wall thickening and mucosal hyperenhancement appears improved. ?  ?There is no evidence for penetrating disease, abscess formation or ?bowel obstruction. ?  ?Vascular/Lymphatic: Aortic atherosclerosis. No aneurysm. No upper ?abdominal adenopathy. No pelvic or inguinal adenopathy. ?  ?Reproductive: No mass or other significant abnormality. ?  ?Other: No free fluid or fluid collections identified. ?  ?Musculoskeletal: No aggressive lytic or sclerotic bone lesions. ?  ?IMPRESSION: ?1. Active colitis is again noted within the sigmoid colon and ?rectum, likely related to the clinical history of Crohn's disease. ?Allowing for differences in technique this is minimally improved ?when compared with 08/16/2016. ?2. Mild inflammatory changes involving the terminal ileum or neo ?terminal ileum evident by mucosal hyperenhancement without ?significant bowel wall thickening. Also improved from previous exam. ?3. No complicating features identified. Specifically no penetrating ?disease, abscess or bowel obstruction. ?4.  Aortic Atherosclerosis ?  ?Colonoscopy 08/23/2016: ?1. Status post ileocecectomy. Widely patent anastomosis ?2. Ulceration on the colonic side of the anastomosis ?3. Significant active ileal Crohn's disease ?4. No obvious colonic disease. Rectosigmoid region looks fine. ?ULCERATED BENIGN SMALL BOWEL MUCOSA SHOWING ACTIVE ILEITIS. ?- NO DYSPLASIA OR MALIGNANCY IDENTIFIED. ?  ?Colonoscopy 09/29/2008: ?Active ileal disease ?- CHRONIC MILDLY ACTIVE ILEITIS. ?  ?Colonoscopy 01/12/2005: ?Diminutive tubular adenomatous polyp removed from the transverse colon ?Ileum, ulcers present, stenosis, ulcerated.   ?Comments: Extended segment with ulceration with edema and stenosis. ?  ?Colonoscopy 03/23/1999: ?Ileal Crohn's with ulceration ?  ?  ?    ?Past Medical History:  ?Diagnosis Date  ? Arthritis    ? Crohn disease ([place])    ? Depression    ? Headache    ?  PAD (peripheral artery disease) ([place])    ?  ?     ?Past Surgical History:  ?Procedure Laterality Date  ? ABDOMINAL SURGERY      ?  Patient states part of intestine was removed  ? AORTA - BILATERAL FEMORAL ARTERY BYPASS GRAFT Bilateral 10/05/2021  ?  Procedure: AORTOBIFEMORAL BYPASS GRAFT AND LYSIS OF ADHESION;  Surgeon: Cherre Robins, MD;  Location: [facility];  Service: Vascular;  Laterality: Bilateral;  ?  ?Social History: She is married.  Currently unemployed.  She has 1 daughter and 1 son.  Smoked cigarettes 1ppd x 40+ years, quit smoking 09/2021. No alcohol use. No drug use.  ?  ?Family History: family history includes Crohn's disease in her father, mother, and sister; Diabetes in her paternal grandmother and son; Heart attack (age of onset: 29) in her father; Heart failure in her father; Hyperlipidemia in her father; Ovarian cancer in her maternal grandmother. ?  ?    ?Allergies  ?Allergen Reactions  ? Codeine Itching  ?  ?  ?  ?    ?Outpatient Encounter Medications as of 01/16/2022  ?Medication Sig  ? acetaminophen (TYLENOL) 500 MG tablet Take 500 mg by mouth every 6 (six) hours as needed for moderate  pain.  ? aspirin 325 MG EC tablet Take 1 tablet (325 mg total) by mouth daily.  ? atorvastatin (LIPITOR) 40 MG tablet Take 1 tablet (40 mg total) by mouth daily.  ? buPROPion (WELLBUTRIN XL) 150 MG 24 hr tablet TAKE 1 TABLET BY MOUTH EVERY DAY (Patient taking differently: 150 mg daily.)  ? diclofenac Sodium (VOLTAREN) 1 % GEL Apply 4 g topically 4 (four) times daily. As needed for hip pain  ? oxyCODONE-acetaminophen (PERCOCET/ROXICET) 5-325 MG tablet Take 1 tablet by mouth every 6 (six) hours as needed for moderate pain. (Patient not taking: Reported on 11/08/2021)  ?  ?No facility-administered encounter medications on file as of 01/16/2022.  ?  ?  ?REVIEW OF SYSTEMS:  ?Gen: Denies fever, sweats or chills. No weight loss.  ?CV: Denies chest pain, palpitations or edema. ?Resp: Denies cough, shortness of breath of  hemoptysis.  ?GI: See HPI. ?GU : Denies urinary burning, blood in urine, increased urinary frequency or incontinence. ?MS: + Arthritis, back pain. Derm: Denies rash, itchiness, skin lesions or unhealing ulcers.

## 2022-03-02 NOTE — Op Note (Signed)
[facility] ?Patient Name: Melissa Chaney ?Procedure Date: 03/02/2022 2:16 PM ?MRN: [id number] ?Endoscopist: Docia Chuck. Henrene Pastor , MD ?Age: 56 ?Referring MD:  ?Date of Birth: [birth year] ?Gender: Female ?Account #: [id number] ?Procedure:                Colonoscopy ?Indications:              High risk colon cancer surveillance: Crohn's  ?                          colitis of 8 (or more) years duration.. Status post  ?                          ileocecectomy 2006 and small bowel resection 2006.  ?                          Last colonoscopy 2017 for active ileal disease. Now  ?                          with right lower quadrant pain. Recent CT  ?                          enterography without significant active disease.  ?                          Recently reestablishing after last follow-up ?Medicines:                Monitored Anesthesia Care ?Procedure:                Pre-Anesthesia Assessment: ?                          - Prior to the procedure, a History and Physical  ?                          was performed, and patient medications and  ?                          allergies were reviewed. The patient's tolerance of  ?                          previous anesthesia was also reviewed. The risks  ?                          and benefits of the procedure and the sedation  ?                          options and risks were discussed with the patient.  ?                          All questions were answered, and informed consent  ?                          was obtained. Prior Anticoagulants: The patient has  ?  taken no previous anticoagulant or antiplatelet  ?                          agents. ASA Grade Assessment: III - A patient with  ?                          severe systemic disease. After reviewing the risks  ?                          and benefits, the patient was deemed in  ?                          satisfactory condition to undergo the procedure. ?                          After obtaining informed  consent, the colonoscope  ?                          was passed under direct vision. Throughout the  ?                          procedure, the patient's blood pressure, pulse, and  ?                          oxygen saturations were monitored continuously. The  ?                          PCF-HQ190L Colonoscope was introduced through the  ?                          anus and advanced to the the ileocolonic  ?                          anastomosis. The neoileum and the rectum were  ?                          photographed. The quality of the bowel preparation  ?                          was good. The colonoscopy was performed without  ?                          difficulty. The patient tolerated the procedure  ?                          well. The bowel preparation used was SUPREP via  ?                          split dose instruction. ?Scope In: 2:29:44 PM ?Scope Out: 2:42:31 PM ?Scope Withdrawal Time: 0 hours 8 minutes 43 seconds  ?Total Procedure Duration: 0 hours 12 minutes 47 seconds  ?Findings:                 The pediatric colonoscope was advanced to the  ?  anastomosis. There was ulceration at the  ?                          anastomosis and a few ulcers. NO SIGNIFICANT  ?                          STENOSIS. ?                          The neo-terminal ileum contained a few ulcers in  ?                          the distal most portion. No significant stenosis.  ?                          Deeper intubation 30 cm proximal revealed no active  ?                          disease. ?                          The exam was otherwise without abnormality on  ?                          direct and retroflexion views. ?Complications:            No immediate complications. Estimated blood loss:  ?                          None. ?Estimated Blood Loss:     Estimated blood loss: none. ?Impression:               1. Mildly active ileal Crohn's disease ?                          2. Normal colon. ?Recommendation:            - Repeat colonoscopy (date not yet determined) for  ?                          surveillance. ?                          - Patient has a contact number available for  ?                          emergencies. The signs and symptoms of potential  ?                          delayed complications were discussed with the  ?                          patient. Return to normal activities tomorrow.  ?                          Written discharge instructions were provided to the  ?  patient. ?                          - Resume previous diet. ?                          - Continue present medications. ?                          - Follow-up with [name] in the office in 4 to 6  ?                          weeks ?Docia Chuck. Henrene Pastor, MD ?03/02/2022 2:53:53 PM ?This report has been signed electronically. ?

## 2022-03-02 NOTE — Progress Notes (Signed)
Sedate, gd SR, tolerated procedure well, VSS, report to RN 

## 2022-03-02 NOTE — Progress Notes (Signed)
Pt's states no medical or surgical changes since previsit or office visit. VS assessed by N.C ?

## 2022-03-06 ENCOUNTER — Telehealth: Payer: Self-pay | Admitting: *Deleted

## 2022-03-06 NOTE — Telephone Encounter (Signed)
?  Follow up Call- ? ? ?  03/02/2022  ?  1:38 PM  ?Call back number  ?Post procedure Call Back phone  # [phone]  ?Permission to leave phone message Yes  ?  ? ?Patient questions: ? ?Do you have a fever, pain , or abdominal swelling? No. ?Pain Score  0 * ? ?Have you tolerated food without any problems? Yes.   ? ?Have you been able to return to your normal activities? Yes.   ? ?Do you have any questions about your discharge instructions: ?Diet   No. ?Medications  No. ?Follow up visit  No. ? ?Do you have questions or concerns about your Care? No. ? ?Actions: ?* If pain score is 4 or above: ?No action needed, pain <4. ? ? ?

## 2022-04-07 ENCOUNTER — Other Ambulatory Visit (INDEPENDENT_AMBULATORY_CARE_PROVIDER_SITE_OTHER): Payer: BC Managed Care – PPO

## 2022-04-07 ENCOUNTER — Other Ambulatory Visit: Payer: Self-pay

## 2022-04-07 ENCOUNTER — Ambulatory Visit: Payer: BC Managed Care – PPO | Admitting: Internal Medicine

## 2022-04-07 ENCOUNTER — Encounter: Payer: Self-pay | Admitting: Internal Medicine

## 2022-04-07 VITALS — BP 120/78 | HR 67 | Ht 66.0 in | Wt 117.2 lb

## 2022-04-07 DIAGNOSIS — K50919 Crohn's disease, unspecified, with unspecified complications: Secondary | ICD-10-CM | POA: Diagnosis not present

## 2022-04-07 DIAGNOSIS — R7989 Other specified abnormal findings of blood chemistry: Secondary | ICD-10-CM

## 2022-04-07 DIAGNOSIS — Z8349 Family history of other endocrine, nutritional and metabolic diseases: Secondary | ICD-10-CM

## 2022-04-07 LAB — VITAMIN B12: Vitamin B-12: 121 pg/mL — ABNORMAL LOW (ref 211–911)

## 2022-04-07 LAB — HIGH SENSITIVITY CRP: CRP, High Sensitivity: 1.04 mg/L (ref 0.000–5.000)

## 2022-04-07 MED ORDER — VITAMIN D3 1.25 MG (50000 UT) PO CAPS: 1.0000 | ORAL_CAPSULE | ORAL | 6 refills | Status: DC

## 2022-04-07 MED ORDER — HUMIRA-CD/UC/HS STARTER 80 MG/0.8ML ~~LOC~~ AJKT: AUTO-INJECTOR | SUBCUTANEOUS | 0 refills | Status: DC

## 2022-04-07 MED ORDER — HUMIRA (2 PEN) 40 MG/0.4ML ~~LOC~~ AJKT: 40.0000 mg | AUTO-INJECTOR | SUBCUTANEOUS | 11 refills | Status: DC

## 2022-04-07 MED ORDER — AZATHIOPRINE 50 MG PO TABS
50.0000 mg | ORAL_TABLET | Freq: Every day | ORAL | 3 refills | Status: DC
Start: 2022-04-07 — End: 2023-01-16

## 2022-04-07 NOTE — Patient Instructions (Signed)
If you are age 57 or older, your body mass index should be between 23-30. Your Body mass index is 18.92 kg/m. If this is out of the aforementioned range listed, please consider follow up with your Primary Care Provider.  If you are age 62 or younger, your body mass index should be between 19-25. Your Body mass index is 18.92 kg/m. If this is out of the aformentioned range listed, please consider follow up with your Primary Care Provider.   ________________________________________________________  The [facility] providers would like to encourage you to use [facility] to communicate with providers for non-urgent requests or questions.  Due to long hold times on the telephone, sending your provider a message by [facility] may be a faster and more efficient way to get a response.  Please allow 48 business hours for a response.  Please remember that this is for non-urgent requests.  _______________________________________________________  Your provider has requested that you go to the basement level for lab work before leaving today. Press "B" on the elevator. The lab is located at the first door on the left as you exit the elevator.  We have sent the following medications to your pharmacy for you to pick up at your convenience:  Vitamin D, Imuran  Rosanne Sack will contact you to restart Humira  Please follow up in 3 months

## 2022-04-07 NOTE — Progress Notes (Signed)
HISTORY OF PRESENT ILLNESS:  Melissa Chaney is a 56 y.o. female with a history of Crohn's disease status post ileocecectomy October 2006 followed by reoperation with resection for small bowel obstruction November 2006.  She also has a history of adenomatous colon polyps.  Active ileal Crohn's disease in the follow-up 2017 for which she was subsequently started on 6-mercaptopurine and Humira.  Last seen in the office September 2019.  Was doing well at that time on medical therapy.  Other issues have included B12 deficiency and occasional diarrhea.  She was lost to follow-up insurance reasons.  Has not been on medical therapy for some time.  Reestablish with this office January 16, 2022.  She was seen by the GI nurse practitioner.  At that time she reported intermittent right mid and right lower quadrant pain.  No other symptoms.  She has had surgery for peripheral vascular disease.  She finally quit smoking.  Blood work at the time of her visit showed unremarkable comprehensive metabolic panel.  Normal liver test.  Normal CBC with hemoglobin 13.0.  Low vitamin D.  Put on replacement.  Normal iron studies.  Set up for CT enterography which was performed February 16, 2022.  That examination did not reveal evidence of active Crohn's disease.  She underwent complete colonoscopy March 02, 2022.  Was found to have nonspecific anastomotic ulceration with no significant stenosis.  The ileum was deeply intubated with only a few small ulcers in the most distal portion.  No stenosis.  She presents today for office follow-up to discuss these findings and discuss various treatment options moving forward.  She tells me she is doing fine.  Occasional vague abdominal discomfort and back discomfort.  No significant diarrhea.  No other complaints.  REVIEW OF SYSTEMS:  All non-GI ROS negative unless otherwise stated in the HPI except for back pain, depression  Past Medical History:  Diagnosis Date   Arthritis    Crohn disease  ([place])    Depression    Headache    PAD (peripheral artery disease) ([place])     Past Surgical History:  Procedure Laterality Date   ABDOMINAL SURGERY  2006   Patient states part of intestine was removed   AORTA - BILATERAL FEMORAL ARTERY BYPASS GRAFT Bilateral 10/05/2021   Procedure: AORTOBIFEMORAL BYPASS GRAFT AND LYSIS OF ADHESION;  Surgeon: Cherre Robins, MD;  Location: [facility];  Service: Vascular;  Laterality: Bilateral;    Social History Melissa Chaney  reports that she has quit smoking. Her smoking use included cigarettes. She has a 20.00 pack-year smoking history. She has never used smokeless tobacco. She reports current alcohol use. She reports that she does not use drugs.  family history includes Crohn's disease in her father, mother, and sister; Diabetes in her paternal grandmother and son; Heart attack (age of onset: 31) in her father; Heart failure in her father; Hyperlipidemia in her father; Ovarian cancer in her maternal grandmother.  Allergies  Allergen Reactions   Codeine Itching       PHYSICAL EXAMINATION: Vital signs: BP 120/78   Pulse 67   Ht 5' 6"  (1.676 m)   Wt 117 lb 4 oz (53.2 kg)   BMI 18.92 kg/m   Constitutional: generally well-appearing, no acute distress Psychiatric: alert and oriented x3, cooperative Eyes: extraocular movements intact, anicteric, conjunctiva pink Mouth: oral pharynx moist, no lesions Neck: supple no lymphadenopathy Cardiovascular: heart regular rate and rhythm, no murmur Lungs: clear to auscultation bilaterally Abdomen: soft, slight fullness and  mild tenderness right lower quadrant, nondistended, no obvious ascites, no peritoneal signs, normal bowel sounds, no organomegaly.  Large midline abdominal incision well-healed Rectal: Unremarkable at time of recent colonoscopy Extremities: no clubbing, cyanosis, or lower extremity edema bilaterally Skin: no lesions on visible extremities Neuro: No focal deficits.  Cranial nerves  intact  ASSESSMENT:   #1. Ileal Crohn status post ileocecectomy 2006 #2. Small bowel obstruction status post laparotomy November 2006 #3. Previously on combination therapy with Humira and 6 mercaptopurine for significant active ileal disease since late 2017 with good clinical response.  Now off of therapy for some time after her insurance lapsed #4. History of B12 deficiency. Not on replacement.  No recent B12 level checked #5. History of diarrhea. No issue at this time #6. History of adenomatous polyp. Last colonoscopy April 2023 without neoplasia. #7.  Vitamin D deficiency.  Prescribed vitamin D.  Now.  Request refill. #8.  Longstanding smoker.  Has quit     PLAN:   #1.  We discussed medical treatment options versus active surveillance.  She is interested in medical therapy, wanting to avoid any significant recurrence of disease.  I again discussed with her the nature of various medical therapies.  We have decided on Imuran and Humira dual therapy.  Medication effects and risks reviewed.  She understands. #2.  Check B12 level today.  Placement if indicated #3. She has been advised to obtain PCP and GYN for routine health maintenance #4.  Recognized for discontinuation of smoking #5. Routine GI follow-up 3 months. Contact the office in the interim for questions or problems #6. Routine colonoscopy 9 to 12 months after initiating medical therapy to reassess Crohn's. A total time of 30 minutes was spent preparing to see the patient, reviewing data obtaining interval history, performing medically appropriate physical exam, ordering medications and blood work.  Arranging follow-up.  And documenting clinical information in the health record.

## 2022-04-10 ENCOUNTER — Telehealth: Payer: Self-pay

## 2022-04-10 ENCOUNTER — Other Ambulatory Visit: Payer: Self-pay

## 2022-04-10 MED ORDER — HUMIRA-CD/UC/HS STARTER 80 MG/0.8ML ~~LOC~~ AJKT
AUTO-INJECTOR | SUBCUTANEOUS | 0 refills | Status: DC
Start: 2022-04-10 — End: 2022-04-10

## 2022-04-10 MED ORDER — HUMIRA-CD/UC/HS STARTER 80 MG/0.8ML ~~LOC~~ AJKT: AUTO-INJECTOR | SUBCUTANEOUS | 0 refills | Status: DC

## 2022-04-10 NOTE — Telephone Encounter (Signed)
-----  Message from Irene Shipper, MD sent at 04/07/2022  3:58 PM EDT ----- I would do a starter kit then maintenance therapy.  Thanks for checking ----- Message ----- From: Algernon Huxley, RN Sent: 04/07/2022   3:27 PM EDT To: Irene Shipper, MD  [name] does the pt need to do a starter kit or just resume the maintenance dose of Humira?  ----- Message ----- From: Audrea Muscat, CMA Sent: 04/07/2022   2:34 PM EDT To: Algernon Huxley, RN  [name] would like to restart patient on Humira - I told her I would let you know and you would start the process.

## 2022-04-10 NOTE — Telephone Encounter (Signed)
Prescription for Humira starter kit and maintenance dose sent to pharmacy.

## 2022-04-11 ENCOUNTER — Other Ambulatory Visit: Payer: Self-pay

## 2022-04-11 ENCOUNTER — Encounter: Payer: Self-pay | Admitting: *Deleted

## 2022-04-11 DIAGNOSIS — E538 Deficiency of other specified B group vitamins: Secondary | ICD-10-CM

## 2022-04-11 DIAGNOSIS — D649 Anemia, unspecified: Secondary | ICD-10-CM

## 2022-04-11 MED ORDER — CYANOCOBALAMIN 1000 MCG/ML IJ SOLN: 1000.0000 ug | INTRAMUSCULAR | 11 refills | Status: DC

## 2022-04-11 MED ORDER — CYANOCOBALAMIN 1000 MCG/ML IJ SOLN
1000.0000 ug | INTRAMUSCULAR | Status: AC
  Administered 2022-04-12 – 2024-08-01 (×7): 1000 ug via INTRAMUSCULAR

## 2022-04-11 MED ORDER — CYANOCOBALAMIN 1000 MCG/ML IJ SOLN: 1000.0000 ug | INTRAMUSCULAR | 0 refills | Status: DC

## 2022-04-12 ENCOUNTER — Ambulatory Visit (INDEPENDENT_AMBULATORY_CARE_PROVIDER_SITE_OTHER): Payer: BC Managed Care – PPO | Admitting: Internal Medicine

## 2022-04-12 ENCOUNTER — Other Ambulatory Visit (HOSPITAL_COMMUNITY): Payer: Self-pay

## 2022-04-12 ENCOUNTER — Other Ambulatory Visit: Payer: Self-pay

## 2022-04-12 DIAGNOSIS — E538 Deficiency of other specified B group vitamins: Secondary | ICD-10-CM

## 2022-04-12 DIAGNOSIS — D649 Anemia, unspecified: Secondary | ICD-10-CM | POA: Diagnosis not present

## 2022-04-12 DIAGNOSIS — K50919 Crohn's disease, unspecified, with unspecified complications: Secondary | ICD-10-CM

## 2022-04-12 NOTE — Telephone Encounter (Signed)
She is coming to have a quantiferon gold drawn.

## 2022-04-13 NOTE — Telephone Encounter (Signed)
Ok thanks 

## 2022-04-17 ENCOUNTER — Other Ambulatory Visit: Payer: BC Managed Care – PPO

## 2022-04-17 DIAGNOSIS — K50919 Crohn's disease, unspecified, with unspecified complications: Secondary | ICD-10-CM

## 2022-04-17 NOTE — Telephone Encounter (Signed)
Pt came for quantiferon gold today. Usually takes several days for this test to result.

## 2022-04-20 LAB — QUANTIFERON-TB GOLD PLUS
QuantiFERON Mitogen Value: >10 IU/mL
QuantiFERON Nil Value: 0.32 IU/mL
QuantiFERON TB1 Ag Value: 0.01 IU/mL
QuantiFERON TB2 Ag Value: 0 IU/mL
QuantiFERON-TB Gold Plus: NEGATIVE

## 2022-04-21 ENCOUNTER — Telehealth: Payer: Self-pay | Admitting: Pharmacy Technician

## 2022-04-21 NOTE — Telephone Encounter (Signed)
Patient Advocate Encounter  Received notification from [place] that prior authorization for HUMIRA 80MG is required.   PA submitted on 6.16.23 Key [id number]  Status is pending   [facility] will continue to follow  Luciano Cutter, CPhT Patient Advocate Phone: [phone]

## 2022-04-26 ENCOUNTER — Other Ambulatory Visit: Payer: Self-pay | Admitting: Internal Medicine

## 2022-04-26 ENCOUNTER — Other Ambulatory Visit: Payer: Self-pay

## 2022-04-26 ENCOUNTER — Ambulatory Visit (INDEPENDENT_AMBULATORY_CARE_PROVIDER_SITE_OTHER): Payer: BC Managed Care – PPO | Admitting: Internal Medicine

## 2022-04-26 ENCOUNTER — Other Ambulatory Visit (HOSPITAL_COMMUNITY): Payer: Self-pay

## 2022-04-26 DIAGNOSIS — E538 Deficiency of other specified B group vitamins: Secondary | ICD-10-CM | POA: Diagnosis not present

## 2022-04-26 DIAGNOSIS — D649 Anemia, unspecified: Secondary | ICD-10-CM | POA: Diagnosis not present

## 2022-04-26 MED ORDER — HUMIRA-CD/UC/HS STARTER 80 MG/0.8ML ~~LOC~~ AJKT: AUTO-INJECTOR | SUBCUTANEOUS | 0 refills | Status: DC

## 2022-04-26 MED ORDER — HUMIRA (2 PEN) 40 MG/0.4ML ~~LOC~~ AJKT: 40.0000 mg | AUTO-INJECTOR | SUBCUTANEOUS | 11 refills | Status: DC

## 2022-04-26 NOTE — Telephone Encounter (Signed)
Spoke with pt and she is aware it has been approved, scripts sent [facility]. She knows she will need to do the starter kit and then start the maintenance dose. 

## 2022-04-26 NOTE — Telephone Encounter (Signed)
Patient Advocate Encounter  Prior Authorization for Melissa Chaney has been approved.   Must fill with [facility].  Effective dates: 6.16.23 through 6.14.24  [place] B. CPhT P: [phone] F: [phone]

## 2022-05-10 ENCOUNTER — Ambulatory Visit (INDEPENDENT_AMBULATORY_CARE_PROVIDER_SITE_OTHER): Payer: BC Managed Care – PPO | Admitting: Internal Medicine

## 2022-05-10 DIAGNOSIS — D649 Anemia, unspecified: Secondary | ICD-10-CM

## 2022-05-10 DIAGNOSIS — E538 Deficiency of other specified B group vitamins: Secondary | ICD-10-CM | POA: Diagnosis not present

## 2022-05-13 ENCOUNTER — Emergency Department (HOSPITAL_BASED_OUTPATIENT_CLINIC_OR_DEPARTMENT_OTHER)
Admission: EM | Admit: 2022-05-13 | Discharge: 2022-05-13 | Disposition: A | Discharge status: Home / Self Care | Payer: BC Managed Care – PPO | Attending: Emergency Medicine | Admitting: Emergency Medicine

## 2022-05-13 ENCOUNTER — Other Ambulatory Visit: Payer: Self-pay

## 2022-05-13 ENCOUNTER — Encounter (HOSPITAL_BASED_OUTPATIENT_CLINIC_OR_DEPARTMENT_OTHER): Payer: Self-pay | Admitting: Emergency Medicine

## 2022-05-13 DIAGNOSIS — Z7982 Long term (current) use of aspirin: Secondary | ICD-10-CM | POA: Insufficient documentation

## 2022-05-13 DIAGNOSIS — M62830 Muscle spasm of back: Secondary | ICD-10-CM | POA: Diagnosis not present

## 2022-05-13 DIAGNOSIS — M549 Dorsalgia, unspecified: Secondary | ICD-10-CM | POA: Diagnosis not present

## 2022-05-13 MED ORDER — NAPROXEN 250 MG PO TABS
500.0000 mg | ORAL_TABLET | Freq: Once | ORAL | Status: AC
  Administered 2022-05-13: 500 mg via ORAL
  Filled 2022-05-13: qty 2

## 2022-05-13 MED ORDER — NAPROXEN 375 MG PO TABS: 375.0000 mg | ORAL_TABLET | Freq: Two times a day (BID) | ORAL | 0 refills | Status: DC

## 2022-05-13 MED ORDER — DIAZEPAM 5 MG PO TABS
5.0000 mg | ORAL_TABLET | Freq: Once | ORAL | Status: AC
  Administered 2022-05-13: 5 mg via ORAL
  Filled 2022-05-13: qty 1

## 2022-05-13 MED ORDER — METHOCARBAMOL 500 MG PO TABS: 500.0000 mg | ORAL_TABLET | Freq: Two times a day (BID) | ORAL | 0 refills | Status: DC

## 2022-05-13 MED ORDER — ACETAMINOPHEN 500 MG PO TABS: 500.0000 mg | ORAL_TABLET | Freq: Four times a day (QID) | ORAL | 0 refills | Status: AC | PRN

## 2022-05-13 MED ORDER — LIDOCAINE 5 % EX PTCH
1.0000 | MEDICATED_PATCH | CUTANEOUS | Status: DC
  Administered 2022-05-13: 1 via TRANSDERMAL
  Filled 2022-05-13: qty 1

## 2022-05-13 MED ORDER — ACETAMINOPHEN 325 MG PO TABS
650.0000 mg | ORAL_TABLET | Freq: Once | ORAL | Status: AC
  Administered 2022-05-13: 650 mg via ORAL
  Filled 2022-05-13: qty 2

## 2022-05-13 MED ORDER — LIDOCAINE 4 % EX PTCH: 1.0000 | MEDICATED_PATCH | Freq: Two times a day (BID) | CUTANEOUS | 0 refills | Status: DC

## 2022-05-13 NOTE — ED Provider Notes (Signed)
[facility] EMERGENCY DEPT Provider Note   CSN: [id number] Arrival date & time: 05/13/22  1349     History  Chief Complaint  Patient presents with   Back Pain    Melissa Chaney is a 56 y.o. female.  HPI     56 year old female with history of peripheral arterial disease status post aorto bifemoral bypass, Crohn's disease comes in with chief complaint of back pain.  Patient indicates that she was moving some heavy brushes for 5 days ago.  She started experiencing pain in her scapular region afterwards.  Pain is starting in the scapular region and moving down towards the lower back.  Pain is present diffusely over her back.  She has no associated abdominal pain.  Pain is constant, worse with any kind of activity.  She has tried over-the-counter medications without significant relief.  She denies any new leg pain or weakness.  Review of system is negative for any dizziness, lightheadedness.  Home Medications Prior to Admission medications   Medication Sig Start Date End Date Taking? Authorizing Provider  acetaminophen (TYLENOL) 500 MG tablet Take 1 tablet (500 mg total) by mouth every 6 (six) hours as needed. 05/13/22  Yes Nechelle Petrizzo, MD  Adalimumab (HUMIRA PEN) 40 MG/0.4ML PNKT Inject 40 mg into the skin every 14 (fourteen) days. 04/26/22   Irene Shipper, MD  Adalimumab [facility] PEN-CD/UC/HS STARTER) 80 MG/0.8ML PNKT Inject 2 pens SQ on day 1, inject 1 pen on day 15 04/26/22   Irene Shipper, MD  aspirin 325 MG EC tablet Take 1 tablet (325 mg total) by mouth daily. 11/01/21   Martyn Malay, MD  atorvastatin (LIPITOR) 40 MG tablet Take 1 tablet (40 mg total) by mouth daily. 08/16/21   Lenoria Chime, MD  azaTHIOprine (IMURAN) 50 MG tablet Take 1 tablet (50 mg total) by mouth daily. 04/07/22   Irene Shipper, MD  buPROPion (WELLBUTRIN XL) 150 MG 24 hr tablet Take 1 tablet (150 mg total) by mouth daily. 02/27/22   Martyn Malay, MD  Cholecalciferol (VITAMIN D3) 1.25 MG  (50000 UT) CAPS Take 1 capsule by mouth once a week. 04/07/22   Irene Shipper, MD  cyanocobalamin (,VITAMIN B-12,) 1000 MCG/ML injection Inject 1 mL (1,000 mcg total) into the muscle every 30 (thirty) days. 04/11/22   Irene Shipper, MD  cyanocobalamin (,VITAMIN B-12,) 1000 MCG/ML injection INJECT 1 ML INTO THE MUSCLE AS DIRECTED EVERY 2 WEEKS 04/26/22   Irene Shipper, MD  diclofenac Sodium (VOLTAREN) 1 % GEL Apply 4 g topically 4 (four) times daily. As needed for hip pain 08/16/21   Lenoria Chime, MD  lidocaine 4 % Place 1 patch onto the skin 2 (two) times daily. 05/13/22  Yes Varney Biles, MD  methocarbamol (ROBAXIN) 500 MG tablet Take 1 tablet (500 mg total) by mouth 2 (two) times daily. 05/13/22  Yes Landi Biscardi, MD  naproxen (NAPROSYN) 375 MG tablet Take 1 tablet (375 mg total) by mouth 2 (two) times daily. 05/13/22  Yes Varney Biles, MD      Allergies    Codeine    Review of Systems   Review of Systems  All other systems reviewed and are negative.   Physical Exam Updated Vital Signs BP 121/68 (BP Location: Right Arm)   Pulse (!) 50   Temp 98.3 F (36.8 C)   Resp 16   SpO2 99%  Physical Exam Vitals and nursing note reviewed.  Constitutional:  Appearance: She is well-developed.  HENT:     Head: Atraumatic.  Cardiovascular:     Rate and Rhythm: Normal rate.  Pulmonary:     Effort: Pulmonary effort is normal.  Abdominal:     Palpations: There is no mass.     Tenderness: There is no abdominal tenderness.  Musculoskeletal:     Cervical back: Normal range of motion and neck supple.     Comments: Patient has paraspinal tenderness over the mid thoracic, lower thoracic and lumbar region.  There is also tenderness over the subscapular region.  Tenderness worse with manipulation of the muscles, and with forward flexion and abduction against resistance  Skin:    General: Skin is warm and dry.  Neurological:     Mental Status: She is alert and oriented to person, place, and  time.     ED Results / Procedures / Treatments   Labs (all labs ordered are listed, but only abnormal results are displayed) Labs Reviewed  URINALYSIS, ROUTINE W REFLEX MICROSCOPIC    EKG None  Radiology No results found.  Procedures Procedures    Medications Ordered in ED Medications  lidocaine (LIDODERM) 5 % 1 patch (has no administration in time range)  diazepam (VALIUM) tablet 5 mg (has no administration in time range)  naproxen (NAPROSYN) tablet 500 mg (has no administration in time range)  acetaminophen (TYLENOL) tablet 650 mg (has no administration in time range)    ED Course/ Medical Decision Making/ A&P                           Medical Decision Making Amount and/or Complexity of Data Reviewed Labs: ordered.  Risk OTC drugs. Prescription drug management.   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  She also indicates that prior to the pain, she was involved in moving heavy brushes.   Patient's pain is reproducible with musculoskeletal activity and manipulation of her back muscles.  She does not have any abdominal tenderness and there is no palpable mass.  The differential diagnosis considered includes musculoskeletal pain, lytic lesions, AAA, dissection.  Given the duration of pain, leaking AAA and dissection is less likely, especially in the setting of normal hemodynamics and no complaints consistent with perfusion issues.  Patient's abdominal exam was reassuring.  I still reviewed the CT scan patient had in 2022 and again in 2023.  CT scan did not reveal any evidence of AAA or dissection, which is reassuring.  Patient no longer smokes.  She is not tachycardic or hypertensive in the ER.  I do not think a CT scan is indicated for her back pain, especially in the light  of overwhelming evidence of no AAA or dissection in the recent work-up, reassuring exam, reassuring history and patient having a specific trigger that could cause pain to her back.  The initial plan is to initiate muscle relaxants.  We discussed deep tissue massage, warm compresses, warm showers or baths and stretching exercises.  We will also give topical lidocaine.  We did discuss with the patient and her past medical history and the complexity that comes with it.  She will follow-up with her PCP in 7 days if her symptoms are not improving.  She will return to the ER sooner if her symptoms are getting worse.  Additional history obtained: Additional history obtained from spouse Records reviewed previous admission documents and previous CT scan from 2021 2023 that are looking at her vascular structures in the torso  Lab work-up and imaging considered.  However patient had recent CAT scan which did not reveal lytic lesions and she does not have any midline spine tenderness.  I do not think imaging or the lab work-up will be helpful.  We will continue conservative management with outpatient follow-up    Final Clinical Impression(s) / ED Diagnoses Final diagnoses:  Muscle spasm of back    Rx / DC Orders ED Discharge Orders          Ordered    methocarbamol (ROBAXIN) 500 MG tablet  2 times daily        05/13/22 1551    acetaminophen (TYLENOL) 500 MG tablet  Every 6 hours PRN        05/13/22 1551    naproxen (NAPROSYN) 375 MG tablet  2 times daily        05/13/22 1551    lidocaine 4 %  2 times daily        05/13/22 1551              Varney Biles, MD 05/13/22 1601

## 2022-05-13 NOTE — Discharge Instructions (Addendum)
Take the medicine prescribed for management of your symptoms, that appears to be because of spasms.  Take Tylenol 5 mg every 4-6 hours.  Take Naprosyn for Singh in the morning and at night with the Tylenol.  Take muscle relaxants in addition as needed.  As discussed, warm compresses, stretching exercises, deep tissue massage are all recommended to alleviate the spasms.   See your primary care doctor in 5 to 7 days if the symptoms are not getting better.

## 2022-05-13 NOTE — ED Triage Notes (Signed)
Pt POV c/o back pain x4 days, denies any injuries. Pain worse with movement and at night. Denies urinary complaints

## 2022-05-13 NOTE — ED Notes (Signed)
Dc instructions reviewed with patient. Patient voiced understanding. Dc with belongings.  °

## 2022-06-12 ENCOUNTER — Ambulatory Visit (INDEPENDENT_AMBULATORY_CARE_PROVIDER_SITE_OTHER): Payer: BC Managed Care – PPO | Admitting: Internal Medicine

## 2022-06-12 DIAGNOSIS — E538 Deficiency of other specified B group vitamins: Secondary | ICD-10-CM

## 2022-06-12 DIAGNOSIS — D649 Anemia, unspecified: Secondary | ICD-10-CM | POA: Diagnosis not present

## 2022-06-12 MED ORDER — CYANOCOBALAMIN 1000 MCG/ML IJ SOLN
1000.0000 ug | INTRAMUSCULAR | Status: AC
  Administered 2022-07-13 – 2023-05-24 (×11): 1000 ug via INTRAMUSCULAR

## 2022-06-12 NOTE — Progress Notes (Signed)
Patient was administered B12 injection today. Patient has finished 3 every 2 weeks injections. She is on monthly injection indefinitely per [name]. Future order is already placed.

## 2022-06-30 ENCOUNTER — Telehealth: Payer: Self-pay | Admitting: Internal Medicine

## 2022-06-30 NOTE — Telephone Encounter (Signed)
Patient called stating she wanted to get set up on Humira, but [facility] (? Not sure if that's right) said she would need a prior authorization.  Please call patient and advise.  Thank you.

## 2022-06-30 NOTE — Telephone Encounter (Signed)
Pt states she needs a PA for Humira.

## 2022-07-04 NOTE — Telephone Encounter (Signed)
Patient called to follow up on PA requested a call back.

## 2022-07-06 ENCOUNTER — Ambulatory Visit: Payer: BC Managed Care – PPO | Admitting: Family Medicine

## 2022-07-06 ENCOUNTER — Encounter: Payer: Self-pay | Admitting: Family Medicine

## 2022-07-06 VITALS — BP 133/56 | HR 59 | Ht 66.0 in | Wt 120.6 lb

## 2022-07-06 DIAGNOSIS — Z1231 Encounter for screening mammogram for malignant neoplasm of breast: Secondary | ICD-10-CM | POA: Insufficient documentation

## 2022-07-06 DIAGNOSIS — F4329 Adjustment disorder with other symptoms: Secondary | ICD-10-CM

## 2022-07-06 DIAGNOSIS — I739 Peripheral vascular disease, unspecified: Secondary | ICD-10-CM

## 2022-07-06 DIAGNOSIS — Z87891 Personal history of nicotine dependence: Secondary | ICD-10-CM | POA: Diagnosis not present

## 2022-07-06 DIAGNOSIS — R739 Hyperglycemia, unspecified: Secondary | ICD-10-CM

## 2022-07-06 DIAGNOSIS — K50919 Crohn's disease, unspecified, with unspecified complications: Secondary | ICD-10-CM | POA: Diagnosis not present

## 2022-07-06 DIAGNOSIS — L820 Inflamed seborrheic keratosis: Secondary | ICD-10-CM

## 2022-07-06 DIAGNOSIS — E119 Type 2 diabetes mellitus without complications: Secondary | ICD-10-CM

## 2022-07-06 DIAGNOSIS — L570 Actinic keratosis: Secondary | ICD-10-CM

## 2022-07-06 LAB — POCT GLYCOSYLATED HEMOGLOBIN (HGB A1C): HbA1c, POC (controlled diabetic range): 6.7 % (ref 0.0–7.0)

## 2022-07-06 MED ORDER — ASPIRIN 325 MG PO TBEC: 325.0000 mg | DELAYED_RELEASE_TABLET | Freq: Every day | ORAL | 3 refills | Status: DC

## 2022-07-06 MED ORDER — ATORVASTATIN CALCIUM 40 MG PO TABS: 40.0000 mg | ORAL_TABLET | Freq: Every day | ORAL | 3 refills | Status: DC

## 2022-07-06 NOTE — Assessment & Plan Note (Signed)
Healed, no further lesion noted on L arm

## 2022-07-06 NOTE — Assessment & Plan Note (Signed)
On left leg, sometimes itching and bleeding and catching on things, s/p cryotherapy x 3 today, tolerated well, procedure form filled out.

## 2022-07-06 NOTE — Patient Instructions (Signed)
It was wonderful to see you today.  Please bring ALL of your medications with you to every visit.   Today we talked about:  We did cryotherapy (cold skin freezing) on the inflamed age spot on your leg.  I refilled your lipitor and aspirin.   We talked about your diagnosis of diabetes- you A1c is current < 7 so no new medication needed. Recommend a yearly eye exam and checking your feet.  We increased your Wellbutrin to 2 tabs (348m total) daily in the morning. Let me know if helping when you need a refill and I can refill this.   [facility] you for choosing [facility]   Please call [phone] any questions about today's appointment.  Please be sure to schedule follow up at the front  desk before you leave today.   Please arrive at least 15 minutes prior to your scheduled appointments.   If you had blood work today, I will send you a MyChart message or a letter if results are normal. Otherwise, I will give you a call.   If you had a referral placed, they will call you to set up an appointment. Please give [facility] call if you don't hear back in the next 2 weeks.   If you need additional refills before your next appointment, please call your pharmacy first.   MYehuda Savannah MD  Family Medicine

## 2022-07-06 NOTE — Assessment & Plan Note (Signed)
Mammogram sheet given to call and schedule

## 2022-07-06 NOTE — Assessment & Plan Note (Signed)
Discussed PA approved in June per chart review, she will reach out to pharmacy Continue B12 injections monthly Offered flu and COVID and Shingles vaccine which she declines at this time

## 2022-07-06 NOTE — Assessment & Plan Note (Signed)
Continue aspirin and statin, LDL < 70 in past year, refilled meds today, yearly followup with vascular

## 2022-07-06 NOTE — Assessment & Plan Note (Signed)
Second A1c > 6.5 today, but well controlled  With diet Discussed annual eye exam Declines vaccines today

## 2022-07-06 NOTE — Progress Notes (Addendum)
    SUBJECTIVE:   CHIEF COMPLAINT / HPI:   Elevated blood sugar- had an A1c 7.1 back in Dec 2022. Repeat today is 6.7. This is second A1c greater than 6.5, thus giving her diagnosis of diabetes.  Crohn's disease- follows with Dr Henrene Pastor of [facility]. status post ileocecectomy October 2006 followed by reoperation with resection for small bowel obstruction November 2006.  She underwent complete colonoscopy March 02, 2022.  Was found to have nonspecific anastomotic ulceration with no significant stenosis.  The ileum was deeply intubated with only a few small ulcers in the most distal portion.  No stenosis. Planning on Doing Humira and Imuran dual therapy with GI, with plans for repeat colonoscopy in 9-12 months following initiation of treatment. Was having nausea but now resolved. Has not started Humira yet bc was told she needs a PA.  Severe PAD with rest pain s/p aortobifemoral bypass 10/05/22 - doing well. Ambulating without pain. Planning for annual ABI with vascular Dr Standley Dakins.  H/o tobacco use- continues with cessation.   Hypertrophic AK- biopsied in Feb 2023- planning for cryotherapy if recurrence. No further skin spots.  Spot on left thigh- raised, itchy, catches on things.  Adjustment disorder- taking Wellbutrin 117m XL, feels like for past 2 months her mood has been down. No acute stressors or changes. NO pain. No SI. Not interested in counseling at this time.   PERTINENT  PMH / PSH: Vitamin B12 deficiency, Chron's disease, PAD s/p aortobifemoral bypass  OBJECTIVE:   BP (!) 133/56   Pulse (!) 59   Ht 5' 6"  (1.676 m)   Wt 120 lb 9.6 oz (54.7 kg)   SpO2 100%   BMI 19.47 kg/m   General: alert & oriented, no apparent distress, well groomed HEENT: normocephalic, atraumatic, EOM grossly intact, oral mucosa moist, neck supple Respiratory: normal respiratory effort GI: non-distended Psych: appropriate mood and affect Skin: on left leg raised, warty stuck on brown lesion, irritation,  no erythema or induration, on left forearm no xerosis or scaly lesions   ASSESSMENT/PLAN:   Hypertrophic actinic keratosis Healed, no further lesion noted on L arm  Seborrheic keratosis, inflamed On left leg, sometimes itching and bleeding and catching on things, s/p cryotherapy x 3 today, tolerated well, procedure form filled out.    Severe peripheral arterial disease (HCC) Continue aspirin and statin, LDL < 70 in past year, refilled meds today, yearly followup with vascular  Crohn's disease ([facility] Discussed PA approved in June per chart review, she will reach out to pharmacy Continue B12 injections monthly Offered flu and COVID and Shingles vaccine which she declines at this time  Adjustment disorder Increase Wellbutrin to 3040mXL, will call in 2 weeks if wanting script for this Offered therapy, declines at this time, no SI  Encounter for screening mammogram for breast cancer Mammogram sheet given to call and schedule  Controlled type 2 diabetes mellitus without complication, without long-term current use of insulin ([facility] A1c > 6.5 today, but well controlled  With diet Discussed annual eye exam Declines vaccines today     MaLenoria ChimeMD [facility]

## 2022-07-06 NOTE — Assessment & Plan Note (Signed)
Increase Wellbutrin to 345m XL, will call in 2 weeks if wanting script for this Offered therapy, declines at this time, no SI

## 2022-07-07 NOTE — Telephone Encounter (Signed)
PT is calling back to check the status of Humira approval

## 2022-07-11 ENCOUNTER — Other Ambulatory Visit (HOSPITAL_COMMUNITY): Payer: Self-pay

## 2022-07-13 ENCOUNTER — Telehealth: Payer: Self-pay

## 2022-07-13 ENCOUNTER — Ambulatory Visit (INDEPENDENT_AMBULATORY_CARE_PROVIDER_SITE_OTHER): Payer: BC Managed Care – PPO | Admitting: Internal Medicine

## 2022-07-13 ENCOUNTER — Other Ambulatory Visit: Payer: Self-pay

## 2022-07-13 DIAGNOSIS — E538 Deficiency of other specified B group vitamins: Secondary | ICD-10-CM | POA: Diagnosis not present

## 2022-07-13 MED ORDER — HUMIRA (2 PEN) 40 MG/0.4ML ~~LOC~~ AJKT: 40.0000 mg | AUTO-INJECTOR | SUBCUTANEOUS | 11 refills | Status: DC

## 2022-07-13 NOTE — Telephone Encounter (Signed)
Prescription sent to pharmacy as requested below.

## 2022-07-13 NOTE — Telephone Encounter (Signed)
Patient was here today for monthly B12 injection.  She would like Rx for Humira sent to [facility].  She has been out since June.    Thank you

## 2022-07-14 ENCOUNTER — Telehealth: Payer: Self-pay | Admitting: Pharmacy Technician

## 2022-07-14 ENCOUNTER — Other Ambulatory Visit (HOSPITAL_COMMUNITY): Payer: Self-pay

## 2022-07-14 NOTE — Telephone Encounter (Signed)
Patient Advocate Encounter  Prior Authorization for Humira 40 MG/0.4ML PNKT   Approval dates already on file from previous encounter  Effective dates: 04-21-2022 to 04-20-2023

## 2022-07-14 NOTE — Telephone Encounter (Signed)
PA has been submitted as expedited, and telephone encounter has been created

## 2022-07-14 NOTE — Telephone Encounter (Signed)
Patient Advocate Encounter  Received notification that prior authorization for HUMIRA is required.   PA submitted on 9.8.23 Key [id number] Status is pending    Luciano Cutter, CPhT Patient Advocate Phone: [phone]

## 2022-07-18 ENCOUNTER — Encounter: Payer: Self-pay | Admitting: Internal Medicine

## 2022-07-18 ENCOUNTER — Telehealth: Payer: Self-pay

## 2022-07-18 ENCOUNTER — Other Ambulatory Visit (HOSPITAL_COMMUNITY): Payer: Self-pay

## 2022-07-18 NOTE — Telephone Encounter (Signed)
Pt called and states her Humira needs a PA with [facility]. Reports she has been out of the medication and needs it asap.

## 2022-07-18 NOTE — Telephone Encounter (Signed)
Called [employer] and had them fax the Approval letter to [place]. Received approval letter and faxed to number listed below. Followed up with [facility] to ensure they received faxed and could process. Spoke with Bell ([facility] rep) and she was able to process and said they would contact pt within 24hrs to set up shipment. Approval letter has also been scanned into media.

## 2022-07-18 NOTE — Telephone Encounter (Signed)
Pt calling stating she has not been able to get her Humira. [facility] stating no PA done. Called [facility] and was told if PA has been done it needs to be faxed to them at [phone]. Pt reports she has not had Humira since June. Please help with this asap.

## 2022-07-19 ENCOUNTER — Other Ambulatory Visit: Payer: Self-pay

## 2022-07-19 MED ORDER — HUMIRA-CD/UC/HS STARTER 80 MG/0.8ML ~~LOC~~ AJKT: AUTO-INJECTOR | SUBCUTANEOUS | 0 refills | Status: DC

## 2022-07-19 NOTE — Telephone Encounter (Signed)
PA was submitted for Starter Pack

## 2022-07-20 NOTE — Telephone Encounter (Signed)
See additionl phone note.

## 2022-07-24 ENCOUNTER — Other Ambulatory Visit (HOSPITAL_COMMUNITY): Payer: Self-pay

## 2022-07-27 ENCOUNTER — Other Ambulatory Visit: Payer: Self-pay | Admitting: Family Medicine

## 2022-07-27 ENCOUNTER — Encounter: Payer: Self-pay | Admitting: Family Medicine

## 2022-07-27 DIAGNOSIS — F4329 Adjustment disorder with other symptoms: Secondary | ICD-10-CM

## 2022-07-27 MED ORDER — BUPROPION HCL ER (XL) 300 MG PO TB24: 300.0000 mg | ORAL_TABLET | Freq: Every day | ORAL | 3 refills | Status: DC

## 2022-07-27 NOTE — Progress Notes (Signed)
Wellbutrin 343m XL sent in per patient request, will discontinue 150 mg XL.

## 2022-07-28 ENCOUNTER — Telehealth: Payer: Self-pay

## 2022-07-28 ENCOUNTER — Other Ambulatory Visit (HOSPITAL_COMMUNITY): Payer: Self-pay

## 2022-07-28 NOTE — Telephone Encounter (Signed)
Inbound call from [facility] pharmacy stating they need authorization for starter kit dose and also inquiring if fax was received. Please advise at [phone].  Thank you 

## 2022-07-28 NOTE — Telephone Encounter (Signed)
Called [employer]. The initial authorization was in June. Starting dose Authorizations are only good for 30 days from the time of approval. They have extended the authorization from 30 days from today's today. It may still take up to 72hrs to be processed at pharmacy level (in this case [facility]). They will be faxing an authorization letter that has the updated extended authorization for the loading dose, but it can take up to the same time frame.

## 2022-07-28 NOTE — Telephone Encounter (Signed)
Will call [employer] to verify. Authorization letter sent to [facility] states Starter dose is included in the authorization. This was scanned into media 9.12.23. Will follow up.

## 2022-07-28 NOTE — Telephone Encounter (Signed)
Can you let me know when the pt can fill the starter kit so I can let her know?

## 2022-07-28 NOTE — Telephone Encounter (Signed)
Received fax from [facility] stating that the Humira maintenance dose has a PA but they need a PA for the starter kit. I faxed over the form.

## 2022-08-01 ENCOUNTER — Other Ambulatory Visit (HOSPITAL_COMMUNITY): Payer: Self-pay

## 2022-08-01 NOTE — Telephone Encounter (Signed)
Any update on the starter kit for pt?

## 2022-08-02 NOTE — Telephone Encounter (Signed)
Just received a fax from [facility] asking if she needed the starter kit. I faxed it back letting them know yes as she has not started the med yet.

## 2022-08-03 NOTE — Telephone Encounter (Signed)
Any updates on Humira starter kit?

## 2022-08-09 ENCOUNTER — Ambulatory Visit
Admission: RE | Admit: 2022-08-09 | Discharge: 2022-08-09 | Disposition: A | Discharge status: Home / Self Care | Payer: BC Managed Care – PPO | Source: Ambulatory Visit | Attending: Family Medicine | Admitting: Family Medicine

## 2022-08-09 DIAGNOSIS — Z1231 Encounter for screening mammogram for malignant neoplasm of breast: Secondary | ICD-10-CM | POA: Diagnosis not present

## 2022-08-11 ENCOUNTER — Other Ambulatory Visit (HOSPITAL_COMMUNITY): Payer: Self-pay

## 2022-08-11 ENCOUNTER — Other Ambulatory Visit: Payer: Self-pay | Admitting: Family Medicine

## 2022-08-11 DIAGNOSIS — R928 Other abnormal and inconclusive findings on diagnostic imaging of breast: Secondary | ICD-10-CM

## 2022-08-11 NOTE — Telephone Encounter (Signed)
Spoke with pt and she does have the starter kit and is going to do injections.

## 2022-08-11 NOTE — Telephone Encounter (Signed)
Insurance reports they received a paid claim for the starter kit on 10.2.23. Called patient to verify, if she has received her shipment or if she'd been in communication with [facility] regarding her shipment. Left message on patients phone to call back for clarification or if further assistance was needed.

## 2022-08-14 ENCOUNTER — Ambulatory Visit (INDEPENDENT_AMBULATORY_CARE_PROVIDER_SITE_OTHER): Payer: BC Managed Care – PPO | Admitting: Internal Medicine

## 2022-08-14 ENCOUNTER — Telehealth: Payer: Self-pay | Admitting: Family Medicine

## 2022-08-14 DIAGNOSIS — E538 Deficiency of other specified B group vitamins: Secondary | ICD-10-CM | POA: Diagnosis not present

## 2022-08-14 NOTE — Progress Notes (Signed)
Pt presents today for nurse visit for an injection. Per Henrene Pastor, MD, injection of B12 given today by Mikeal Hawthorne, CMA. Denies any adverse reactions or discomfort.

## 2022-08-14 NOTE — Telephone Encounter (Signed)
Called and verified that I was speaking with Melissa Chaney. Discussed I saw her abnormal screening mammogram with assymetry of left breast, and wanted to check in. She notes she is doing ok, feels a little nervous, denies any qquestions. She has diagnostic mammogram with [place] scheduled on 08/17/22.  Yehuda Savannah MD

## 2022-08-17 ENCOUNTER — Ambulatory Visit
Admission: RE | Admit: 2022-08-17 | Discharge: 2022-08-17 | Disposition: A | Discharge status: Home / Self Care | Payer: BC Managed Care – PPO | Source: Ambulatory Visit | Attending: Family Medicine | Admitting: Family Medicine

## 2022-08-17 DIAGNOSIS — R928 Other abnormal and inconclusive findings on diagnostic imaging of breast: Secondary | ICD-10-CM

## 2022-08-17 DIAGNOSIS — N6489 Other specified disorders of breast: Secondary | ICD-10-CM | POA: Diagnosis not present

## 2022-09-18 ENCOUNTER — Ambulatory Visit (INDEPENDENT_AMBULATORY_CARE_PROVIDER_SITE_OTHER): Payer: BC Managed Care – PPO | Admitting: Internal Medicine

## 2022-09-18 DIAGNOSIS — E538 Deficiency of other specified B group vitamins: Secondary | ICD-10-CM

## 2022-10-08 ENCOUNTER — Other Ambulatory Visit: Payer: Self-pay | Admitting: Internal Medicine

## 2022-10-19 ENCOUNTER — Ambulatory Visit (INDEPENDENT_AMBULATORY_CARE_PROVIDER_SITE_OTHER): Payer: BC Managed Care – PPO | Admitting: Internal Medicine

## 2022-10-19 DIAGNOSIS — E538 Deficiency of other specified B group vitamins: Secondary | ICD-10-CM | POA: Diagnosis not present

## 2022-10-19 DIAGNOSIS — K50919 Crohn's disease, unspecified, with unspecified complications: Secondary | ICD-10-CM

## 2022-10-19 DIAGNOSIS — D649 Anemia, unspecified: Secondary | ICD-10-CM

## 2022-10-30 ENCOUNTER — Other Ambulatory Visit: Payer: Self-pay | Admitting: Internal Medicine

## 2022-11-17 ENCOUNTER — Ambulatory Visit (INDEPENDENT_AMBULATORY_CARE_PROVIDER_SITE_OTHER): Payer: BC Managed Care – PPO | Admitting: Internal Medicine

## 2022-11-17 DIAGNOSIS — E538 Deficiency of other specified B group vitamins: Secondary | ICD-10-CM

## 2022-12-08 ENCOUNTER — Ambulatory Visit: Payer: BC Managed Care – PPO | Admitting: Family Medicine

## 2022-12-08 VITALS — BP 130/64 | HR 83 | Ht 66.0 in | Wt 123.0 lb

## 2022-12-08 DIAGNOSIS — L0231 Cutaneous abscess of buttock: Secondary | ICD-10-CM

## 2022-12-08 MED ORDER — DOXYCYCLINE HYCLATE 100 MG PO TABS: 100.0000 mg | ORAL_TABLET | Freq: Two times a day (BID) | ORAL | 0 refills | Status: DC

## 2022-12-08 MED ORDER — MUPIROCIN 2 % EX OINT: 1.0000 | TOPICAL_OINTMENT | Freq: Two times a day (BID) | CUTANEOUS | 0 refills | Status: DC

## 2022-12-08 NOTE — Patient Instructions (Signed)
You have an abscess in your gluteal area. Reassuringly it has already opened somewhat and started to drain.  I am going to start you on antibiotics today that you will take for the next 10 days (make sure to take this medication with food as it can make you nauseous).  I am also giving you a topical medication that you can put on several times per day to help.  One of the biggest things can help is using warm compresses in the area and trying to encourage the drainage.  If you are not having any improvement or if it is getting bigger by Monday, let our office know because then we would plan to either have you come in for reevaluation or consider sending you to general surgery to have them drain it.

## 2022-12-08 NOTE — Progress Notes (Signed)
    SUBJECTIVE:   CHIEF COMPLAINT / HPI:   Pain in bottom - Has had a bump around her anal region - Felt like she had it pop yesterday and it drained, had some blood on the  - Did not have any blood when wiping this morning - No issues with constipation, does sometimes strain when going to the bathroom - The area is currently painful to the touch   PERTINENT  PMH / PSH: reviewed   OBJECTIVE:   BP 130/64   Pulse 83   Ht '5\' 6"'$  (1.676 m)   Wt 123 lb (55.8 kg)   SpO2 99%   BMI 19.85 kg/m   General: NAD, well-appearing, well-nourished Respiratory: No respiratory distress, breathing comfortably, able to speak in full sentences Skin: warm and dry. Fluctuant mass measuring about 1cmx1cm present on the right gluteal region near the anus with some drainage present,  Psych: Appropriate affect and mood  ASSESSMENT/PLAN:   Abscess of gluteal region, right Physical exam consistent with abscess, which has reassuringly already started to drain. Discussed options with patient and we will start oral and topical antibiotics at this time. Discussed case with Dr. Andria Frames who is in agreement with plan.  - Doxycycline '100mg'$  BID x 10 days - Bactroban TID  - Warm compresses to encourage drainage - Tylenol and Ibuprofen as needed for pain - Call if not improving by Monday, go to ER/urgent care if worsening over the weekend   Melissa Chaney, [place]

## 2022-12-18 ENCOUNTER — Ambulatory Visit (INDEPENDENT_AMBULATORY_CARE_PROVIDER_SITE_OTHER): Payer: BC Managed Care – PPO | Admitting: Internal Medicine

## 2022-12-18 DIAGNOSIS — E538 Deficiency of other specified B group vitamins: Secondary | ICD-10-CM | POA: Diagnosis not present

## 2022-12-19 NOTE — Progress Notes (Signed)
B12 shot with nursing

## 2023-01-16 ENCOUNTER — Encounter: Payer: Self-pay | Admitting: Internal Medicine

## 2023-01-16 ENCOUNTER — Other Ambulatory Visit (INDEPENDENT_AMBULATORY_CARE_PROVIDER_SITE_OTHER): Payer: BC Managed Care – PPO

## 2023-01-16 ENCOUNTER — Ambulatory Visit: Payer: BC Managed Care – PPO | Admitting: Internal Medicine

## 2023-01-16 VITALS — BP 113/72 | HR 75 | Ht 66.0 in | Wt 124.0 lb

## 2023-01-16 DIAGNOSIS — E538 Deficiency of other specified B group vitamins: Secondary | ICD-10-CM

## 2023-01-16 DIAGNOSIS — K50919 Crohn's disease, unspecified, with unspecified complications: Secondary | ICD-10-CM

## 2023-01-16 LAB — COMPREHENSIVE METABOLIC PANEL
ALT: 18 U/L (ref 0–35)
AST: 22 U/L (ref 0–37)
Albumin: 4.2 g/dL (ref 3.5–5.2)
Alkaline Phosphatase: 85 U/L (ref 39–117)
BUN: 10 mg/dL (ref 6–23)
CO2: 31 mEq/L (ref 19–32)
Calcium: 10.3 mg/dL (ref 8.4–10.5)
Chloride: 100 mEq/L (ref 96–112)
Creatinine, Ser: 1.11 mg/dL (ref 0.40–1.20)
GFR: 55.03 mL/min — ABNORMAL LOW (ref >60.00)
Glucose, Bld: 131 mg/dL — ABNORMAL HIGH (ref 70–99)
Potassium: 4.6 mEq/L (ref 3.5–5.1)
Sodium: 138 mEq/L (ref 135–145)
Total Bilirubin: 0.8 mg/dL (ref 0.2–1.2)
Total Protein: 7.2 g/dL (ref 6.0–8.3)

## 2023-01-16 LAB — CBC WITH DIFFERENTIAL/PLATELET
Basophils Absolute: 0.1 10*3/uL (ref 0.0–0.1)
Basophils Relative: 0.6 % (ref 0.0–3.0)
Eosinophils Absolute: 0.1 10*3/uL (ref 0.0–0.7)
Eosinophils Relative: 0.5 % (ref 0.0–5.0)
HCT: 40.3 % (ref 36.0–46.0)
Hemoglobin: 13.7 g/dL (ref 12.0–15.0)
Lymphocytes Relative: 18.5 % (ref 12.0–46.0)
Lymphs Abs: 2 10*3/uL (ref 0.7–4.0)
MCHC: 33.9 g/dL (ref 30.0–36.0)
MCV: 95.3 fl (ref 78.0–100.0)
Monocytes Absolute: 0.7 10*3/uL (ref 0.1–1.0)
Monocytes Relative: 6.4 % (ref 3.0–12.0)
Neutro Abs: 7.9 10*3/uL — ABNORMAL HIGH (ref 1.4–7.7)
Neutrophils Relative %: 74 % (ref 43.0–77.0)
Platelets: 205 10*3/uL (ref 150.0–400.0)
RBC: 4.23 Mil/uL (ref 3.87–5.11)
RDW: 15.2 % (ref 11.5–15.5)
WBC: 10.7 10*3/uL — ABNORMAL HIGH (ref 4.0–10.5)

## 2023-01-16 LAB — VITAMIN B12: Vitamin B-12: >1500 pg/mL — ABNORMAL HIGH (ref 211–911)

## 2023-01-16 LAB — C-REACTIVE PROTEIN: CRP: <1 mg/dL (ref 0.5–20.0)

## 2023-01-16 MED ORDER — HUMIRA (2 PEN) 40 MG/0.4ML ~~LOC~~ AJKT: 40.0000 mg | AUTO-INJECTOR | SUBCUTANEOUS | 11 refills | Status: DC

## 2023-01-16 MED ORDER — CYANOCOBALAMIN 1000 MCG/ML IJ SOLN: INTRAMUSCULAR | 6 refills | Status: DC

## 2023-01-16 MED ORDER — AZATHIOPRINE 50 MG PO TABS: 50.0000 mg | ORAL_TABLET | Freq: Every day | ORAL | 3 refills | Status: DC

## 2023-01-16 NOTE — Progress Notes (Signed)
HISTORY OF PRESENT ILLNESS:  Melissa Chaney is a 57 y.o. female with a history of Crohn's disease status post ileocecectomy October 2006 followed by reoperation with resection for small bowel obstruction November 2006.  She also has a history of adenomatous colon polyps.  Her disease is associated B12 deficiency for which she is on replacement.  She had been on 6-mercaptopurine and Humira, and doing well, until she stopped due to insurance reasons.  She last underwent colonoscopy March 02, 2022.  She was found to have mild ileal Crohn's disease.  The colon was normal.  No stenosis.  Her last CT enterography was performed February 16, 2022.  No evidence for active Crohn's.  She was last seen in the office April 07, 2022.  Dictation for details.  She has not had significant blood work in 1 year.  Her last C-reactive protein April 07, 2022 was normal.  B12 at that time was low at 121.  She is due for B12 shot today.  She asked for medication refills.  She did restart Humira and Imuran October 2023.  Currently taking 40 mg of Humira subcutaneously once every 2 weeks.  Her Imuran dose is 50 mg daily.  She states she is feeling well without complaints.  Occasional bloating but otherwise well.  REVIEW OF SYSTEMS:  All non-GI ROS negative unless otherwise stated in the HPI except for fatigue, depression, arthritis  Past Medical History:  Diagnosis Date   Arthritis    Crohn disease ([place])    Depression    Headache    PAD (peripheral artery disease) ([place])     Past Surgical History:  Procedure Laterality Date   ABDOMINAL SURGERY  2006   Patient states part of intestine was removed   AORTA - BILATERAL FEMORAL ARTERY BYPASS GRAFT Bilateral 10/05/2021   Procedure: AORTOBIFEMORAL BYPASS GRAFT AND LYSIS OF ADHESION;  Surgeon: Cherre Robins, MD;  Location: [place] [facility];  Service: Vascular;  Laterality: Bilateral;    Social History Melissa Chaney  reports that she has quit smoking. Her smoking use included cigarettes.  She has a 20.00 pack-year smoking history. She has never used smokeless tobacco. She reports current alcohol use. She reports that she does not use drugs.  family history includes Crohn's disease in her father, mother, and sister; Diabetes in her paternal grandmother and son; Heart attack (age of onset: 108) in her father; Heart failure in her father; Hyperlipidemia in her father; Ovarian cancer in her maternal grandmother.  Allergies  Allergen Reactions   Codeine Itching       PHYSICAL EXAMINATION: Vital signs: BP 113/72   Pulse 75   Ht '5\' 6"'$  (1.676 m)   Wt 124 lb (56.2 kg)   BMI 20.01 kg/m   Constitutional: generally well-appearing, no acute distress Psychiatric: alert and oriented x3, cooperative Eyes: extraocular movements intact, anicteric, conjunctiva pink Mouth: oral pharynx moist, no lesions Neck: supple no lymphadenopathy Cardiovascular: heart regular rate and rhythm, no murmur Lungs: clear to auscultation bilaterally Abdomen: soft, nontender, nondistended, no obvious ascites, no peritoneal signs, normal bowel sounds, no organomegaly.  Surgical incisions well-healed Rectal: Omitted Extremities: no clubbing, cyanosis, or lower extremity edema bilaterally Skin: no lesions on visible extremities Neuro: No focal deficits.  Cranial nerves intact  ASSESSMENT:  1.  History of Crohn's disease status post ileocecectomy October 2006 followed by reoperation with resection of small bowel obstruction November 2006.  Currently doing well on Imuran and Humira.  Last CT enterography negative for active disease.  Last colonoscopy with minimal ileal inflammation. 2.  B12 deficiency.  On replacement   PLAN:  1.  Continue Humira.  Refilled.  Medication risk reviewed 2.  Continue Imuran.  Medication refilled.  Medication risk reviewed. 3.  Continue B12 replacement.  Refilled. 4.  Blood work today including CBC, comprehensive metabolic panel, C-reactive protein, and B12 level 5.  Future  blood work to be determined after the above resulted. 6.  Routine office follow-up 6 months.  Contact the office in the interim for any questions or problems.  She agrees.

## 2023-01-16 NOTE — Patient Instructions (Addendum)
_______________________________________________________  If your blood pressure at your visit was 140/90 or greater, please contact your primary care physician to follow up on this.  _______________________________________________________  If you are age 58 or older, your body mass index should be between 23-30. Your Body mass index is 20.01 kg/m. If this is out of the aforementioned range listed, please consider follow up with your Primary Care Provider.  If you are age 18 or younger, your body mass index should be between 19-25. Your Body mass index is 20.01 kg/m. If this is out of the aformentioned range listed, please consider follow up with your Primary Care Provider.   ________________________________________________________  The [facility] providers would like to encourage you to use [facility] to communicate with providers for non-urgent requests or questions.  Due to long hold times on the telephone, sending your provider a message by [facility] may be a faster and more efficient way to get a response.  Please allow 48 business hours for a response.  Please remember that this is for non-urgent requests.  _______________________________________________________  Your provider has requested that you go to the basement level for lab work before leaving today. Press "B" on the elevator. The lab is located at the first door on the left as you exit the elevator.  We have sent the following medications to your pharmacy for you to pick up at your convenience: Imuran,Humira, B12

## 2023-02-19 ENCOUNTER — Ambulatory Visit (INDEPENDENT_AMBULATORY_CARE_PROVIDER_SITE_OTHER): Payer: BC Managed Care – PPO | Admitting: Internal Medicine

## 2023-02-19 DIAGNOSIS — E538 Deficiency of other specified B group vitamins: Secondary | ICD-10-CM | POA: Diagnosis not present

## 2023-02-19 NOTE — Progress Notes (Signed)
Patient with Crohn's and B12 deficiency.  Presents for B12 injection via nurse.  Completed.

## 2023-02-26 ENCOUNTER — Other Ambulatory Visit: Payer: Self-pay | Admitting: *Deleted

## 2023-02-26 DIAGNOSIS — I739 Peripheral vascular disease, unspecified: Secondary | ICD-10-CM

## 2023-03-05 NOTE — Progress Notes (Unsigned)
[facility] [place]  ASSESSMENT / PLAN: Melissa Chaney is a 57 y.o. female status post aortobifemoral bypass 10/05/22 for rest pain.  Recommend the following which can slow the progression of atherosclerosis and reduce the risk of major adverse cardiac / limb events:  Complete cessation from all tobacco products. Blood glucose control with goal A1c < 7%. Blood pressure control with goal blood pressure < 140/90 mmHg. Lipid reduction therapy with goal LDL-C <100 mg/dL (<16 if symptomatic from PAD).  Aspirin 81mg  PO QD.  Atorvastatin 40-80mg  PO QD (or other "high intensity" statin therapy).  Patient continues to do very well from aortobifemoral bypass.  Seroma present in the right groin.  I encouraged nonoperative management of this.  She is agreeable.  I will see her again in a year with ankle-brachial index.  CHIEF COMPLAINT: Bilateral leg pain  HISTORY OF PRESENT ILLNESS: Melissa Chaney is a 57 y.o. female who presents to clinic for evaluation of bilateral leg pain.  The patient reports this is been slowly progressive over the past year.  The patient reports fairly typical symptoms of disabling claudication in her thighs and calves.  She also has pain in her left foot that will occasionally awaken her at night.  He has no ulcers about her feet.  03/05/23: Patient returns to clinic for surveillance after aortobifemoral bypass.  She did very well from the surgery and continues to enjoy primary patency.  She does have a seroma in her right groin, which has persisted since her initial postoperative evaluation.  This is not very bothersome to her.  No signs or symptoms of infection.  VASCULAR SURGICAL HISTORY: none  VASCULAR RISK FACTORS: Negative history of stroke / transient ischemic attack. Negative history of coronary artery disease.  Negative history of diabetes mellitus.  Positive history of smoking. Not actively smoking. Negative history of hypertension.   Negative history of chronic kidney disease.  Negative history of chronic obstructive pulmonary disease.  FUNCTIONAL STATUS: ECOG performance status: (1) Restricted in physically strenuous activity, ambulatory and able to do work of light nature Ambulatory status: Ambulatory within the community with limits  Past Medical History:  Diagnosis Date   Arthritis    Crohn disease ([facility])    Depression    Headache    PAD (peripheral artery disease) ([facility])     Past Surgical History:  Procedure Laterality Date   ABDOMINAL SURGERY  2006   Patient states part of intestine was removed   AORTA - BILATERAL FEMORAL ARTERY BYPASS GRAFT Bilateral 10/05/2021   Procedure: AORTOBIFEMORAL BYPASS GRAFT AND LYSIS OF ADHESION;  Surgeon: Leonie Douglas, MD;  Location: [facility];  Service: Vascular;  Laterality: Bilateral;    Family History  Problem Relation Age of Onset   Crohn's disease Mother    Hyperlipidemia Father    Heart failure Father    Heart attack Father 37   Crohn's disease Father    Crohn's disease Sister    Ovarian cancer Maternal Grandmother    Diabetes Paternal Grandmother    Diabetes Son    Colon cancer Neg Hx     Social History   Socioeconomic History   Marital status: Married    Spouse name: Not on file   Number of children: 2   Years of education: Not on file   Highest education level: Not on file  Occupational History   Occupation: Haematologist  Tobacco Use   Smoking status: Former    Packs/day: 0.50  Years: 40.00    Additional pack years: 0.00    Total pack years: 20.00    Types: Cigarettes   Smokeless tobacco: Never   Tobacco comments:    Quit Nov 2022  Vaping Use   Vaping Use: Never used  Substance and Sexual Activity   Alcohol use: Yes    Comment: occ   Drug use: No   Sexual activity: Not on file  Other Topics Concern   Not on file  Social History Narrative   Not on file   Social Determinants of Health   Financial Resource Strain: Not on file   Food Insecurity: Not on file  Transportation Needs: Not on file  Physical Activity: Not on file  Stress: Not on file  Social Connections: Not on file  Intimate Partner Violence: Not on file    Allergies  Allergen Reactions   Codeine Itching    Current Outpatient Medications  Medication Sig Dispense Refill   acetaminophen (TYLENOL) 500 MG tablet Take 1 tablet (500 mg total) by mouth every 6 (six) hours as needed. 30 tablet 0   Adalimumab (HUMIRA PEN-CD/UC/HS STARTER) 80 MG/0.8ML PNKT Inject 2 pens SQ on day 1 Inject 1 pen SQ on day 15 3 each 0   Adalimumab (HUMIRA, 2 PEN,) 40 MG/0.4ML PNKT Inject 40 mg into the skin every 14 (fourteen) days. 2 each 11   aspirin EC 325 MG tablet Take 1 tablet (325 mg total) by mouth daily. 90 tablet 3   atorvastatin (LIPITOR) 40 MG tablet Take 1 tablet (40 mg total) by mouth daily. 90 tablet 3   azaTHIOprine (IMURAN) 50 MG tablet Take 1 tablet (50 mg total) by mouth daily. 90 tablet 3   buPROPion (WELLBUTRIN XL) 300 MG 24 hr tablet Take 1 tablet (300 mg total) by mouth daily. 90 tablet 3   Cholecalciferol (VITAMIN D3) 1.25 MG (50000 UT) CAPS Take 1 capsule by mouth once a week. 30 capsule 6   cyanocobalamin (VITAMIN B12) 1000 MCG/ML injection INJECT 1 ML INTO THE MUSCLE AS DIRECTED EVERY 2 WEEKS 6 mL 6   diclofenac Sodium (VOLTAREN) 1 % GEL Apply 4 g topically 4 (four) times daily. As needed for hip pain 50 g 2   doxycycline (VIBRA-TABS) 100 MG tablet Take 1 tablet (100 mg total) by mouth 2 (two) times daily. 20 tablet 0   lidocaine 4 % Place 1 patch onto the skin 2 (two) times daily. 12 patch 0   mupirocin ointment (BACTROBAN) 2 % Apply 1 Application topically 2 (two) times daily. 22 g 0   Current Facility-Administered Medications  Medication Dose Route Frequency Provider Last Rate Last Admin   cyanocobalamin ((VITAMIN B-12)) injection 1,000 mcg  1,000 mcg Intramuscular UD Hilarie Fredrickson, MD   1,000 mcg at 06/12/22 1126   cyanocobalamin (VITAMIN B12)  injection 1,000 mcg  1,000 mcg Intramuscular Q30 days Hilarie Fredrickson, MD   1,000 mcg at 02/19/23 1002    PHYSICAL EXAM Vitals:   03/06/23 1050  BP: 122/61  Pulse: 69  Resp: 20  Temp: 97.6 F (36.4 C)  SpO2: 97%  Weight: 124 lb (56.2 kg)  Height: 5\' 6"  (1.676 m)   Well-appearing woman in no acute distress Regular rate and rhythm Unlabored breathing Weakly palpable pedal pulses bilaterally Moderate seroma in right groin, this appears somewhat improved from prior exam  PERTINENT LABORATORY AND RADIOLOGIC DATA  Most recent CBC    Latest Ref Rng & Units 01/16/2023   12:30 PM 01/16/2022  12:18 PM 10/13/2021    9:44 PM  CBC  WBC 4.0 - 10.5 K/uL 10.7  6.2  7.6   Hemoglobin 12.0 - 15.0 g/dL 13.0  86.5  78.4   Hematocrit 36.0 - 46.0 % 40.3  39.6  31.5   Platelets 150.0 - 400.0 K/uL 205.0  193.0  281      Most recent CMP    Latest Ref Rng & Units 01/16/2023   12:30 PM 01/16/2022   12:18 PM 10/13/2021    9:44 PM  CMP  Glucose 70 - 99 mg/dL 696  295  284   BUN 6 - 23 mg/dL 10  14  8    Creatinine 0.40 - 1.20 mg/dL 1.32  4.40  1.02   Sodium 135 - 145 mEq/L 138  138  133   Potassium 3.5 - 5.1 mEq/L 4.6  4.1  3.5   Chloride 96 - 112 mEq/L 100  98  99   CO2 19 - 32 mEq/L 31  31  24    Calcium 8.4 - 10.5 mg/dL 72.5  36.6  8.9   Total Protein 6.0 - 8.3 g/dL 7.2  7.7    Total Bilirubin 0.2 - 1.2 mg/dL 0.8  0.6    Alkaline Phos 39 - 117 U/L 85  90    AST 0 - 37 U/L 22  25    ALT 0 - 35 U/L 18  26     LDL Chol Calc (NIH)  Date Value Ref Range Status  08/01/2021 90 0 - 99 mg/dL Final   LDL Cholesterol  Date Value Ref Range Status  10/06/2021 17 0 - 99 mg/dL Final    Comment:           Total Cholesterol/HDL:CHD Risk Coronary Heart Disease Risk Table                     Men   Women  1/2 Average Risk   3.4   3.3  Average Risk       5.0   4.4  2 X Average Risk   9.6   7.1  3 X Average Risk  23.4   11.0        Use the calculated Patient Ratio above and the CHD Risk Table to  determine the patient's CHD Risk.        ATP III CLASSIFICATION (LDL):  <100     mg/dL   Optimal  440-347  mg/dL   Near or Above                    Optimal  130-159  mg/dL   Borderline  425-956  mg/dL   High  >387     mg/dL   Very High Performed at [facility], [address] [place], [place] [address]      Vascular Imaging:  +-------+-----------+-----------+------------+------------+  ABI/TBIToday's ABIToday's TBIPrevious ABIPrevious TBI  +-------+-----------+-----------+------------+------------+  Right 1.04       1.01       0.98        0.83          +-------+-----------+-----------+------------+------------+  Left  1.02       0.89       1.02        0.65          +-------+-----------+-----------+------------+------------+    Rande Brunt. Lenell Antu, MD [facility] Phone Number: [phone] 03/06/2023 11:59 AM  Total time spent  on preparing this encounter including chart review, data review, collecting history, examining the patient, coordinating care for this established patient, 20 minutes  Portions of this report may have been transcribed using voice recognition software.  Every effort has been made to ensure accuracy; however, inadvertent computerized transcription errors may still be present.

## 2023-03-06 ENCOUNTER — Ambulatory Visit (HOSPITAL_COMMUNITY)
Admission: RE | Admit: 2023-03-06 | Discharge: 2023-03-06 | Disposition: A | Discharge status: Home / Self Care | Payer: BC Managed Care – PPO | Source: Ambulatory Visit | Attending: Vascular Surgery | Admitting: Vascular Surgery

## 2023-03-06 ENCOUNTER — Encounter: Payer: Self-pay | Admitting: Vascular Surgery

## 2023-03-06 ENCOUNTER — Ambulatory Visit: Payer: BC Managed Care – PPO | Admitting: Vascular Surgery

## 2023-03-06 VITALS — BP 122/61 | HR 69 | Temp 97.6°F | Resp 20 | Ht 66.0 in | Wt 124.0 lb

## 2023-03-06 DIAGNOSIS — Z95828 Presence of other vascular implants and grafts: Secondary | ICD-10-CM | POA: Diagnosis not present

## 2023-03-06 DIAGNOSIS — I739 Peripheral vascular disease, unspecified: Secondary | ICD-10-CM | POA: Diagnosis not present

## 2023-03-06 LAB — VAS US ABI WITH/WO TBI
Left ABI: 1.02
Right ABI: 1.04

## 2023-03-15 ENCOUNTER — Other Ambulatory Visit: Payer: Self-pay

## 2023-03-15 DIAGNOSIS — I739 Peripheral vascular disease, unspecified: Secondary | ICD-10-CM

## 2023-03-21 ENCOUNTER — Ambulatory Visit (INDEPENDENT_AMBULATORY_CARE_PROVIDER_SITE_OTHER): Payer: BC Managed Care – PPO | Admitting: Internal Medicine

## 2023-03-21 DIAGNOSIS — E538 Deficiency of other specified B group vitamins: Secondary | ICD-10-CM | POA: Diagnosis not present

## 2023-03-21 NOTE — Progress Notes (Signed)
Patient received B12 injection today. No complaints.

## 2023-04-12 ENCOUNTER — Telehealth: Payer: Self-pay

## 2023-04-12 NOTE — Telephone Encounter (Signed)
*  Gastro  PA request received via CMM for Humira Pen (CF) 40MG /0.4ML pen-injector kit  PA submitted to [facility] and is pending additional questions/determination  Key: [id number]

## 2023-04-16 NOTE — Telephone Encounter (Signed)
Questions generated and have been submitted with chart notes.

## 2023-04-18 NOTE — Telephone Encounter (Signed)
PA has been APPROVED from 04/17/2023-04/15/2024

## 2023-04-23 ENCOUNTER — Ambulatory Visit (INDEPENDENT_AMBULATORY_CARE_PROVIDER_SITE_OTHER): Payer: BC Managed Care – PPO | Admitting: Internal Medicine

## 2023-04-23 DIAGNOSIS — E538 Deficiency of other specified B group vitamins: Secondary | ICD-10-CM

## 2023-04-23 NOTE — Progress Notes (Signed)
Patient with Crohn's disease presents for B12 shot due to disease associated B12 deficiency.

## 2023-04-23 NOTE — Progress Notes (Signed)
patient given B12 injection. Tolerated injection well and left the clinic ambulatory.

## 2023-05-24 ENCOUNTER — Ambulatory Visit (INDEPENDENT_AMBULATORY_CARE_PROVIDER_SITE_OTHER): Payer: BC Managed Care – PPO | Admitting: Internal Medicine

## 2023-05-24 DIAGNOSIS — K50919 Crohn's disease, unspecified, with unspecified complications: Secondary | ICD-10-CM

## 2023-05-24 DIAGNOSIS — E538 Deficiency of other specified B group vitamins: Secondary | ICD-10-CM

## 2023-05-24 NOTE — Progress Notes (Signed)
B12 replacement via nurse today

## 2023-06-04 IMAGING — CR DG HIP (WITH OR WITHOUT PELVIS) 2-3V*L*
3 series · 3 of 3 positions shown · non-contrast
Comparison: CT 11/07/2015.

CLINICAL DATA: Left hip pain.  No known injury.

EXAM:
DG HIP (WITH OR WITHOUT PELVIS) 2-3V LEFT

[pelvis ap]
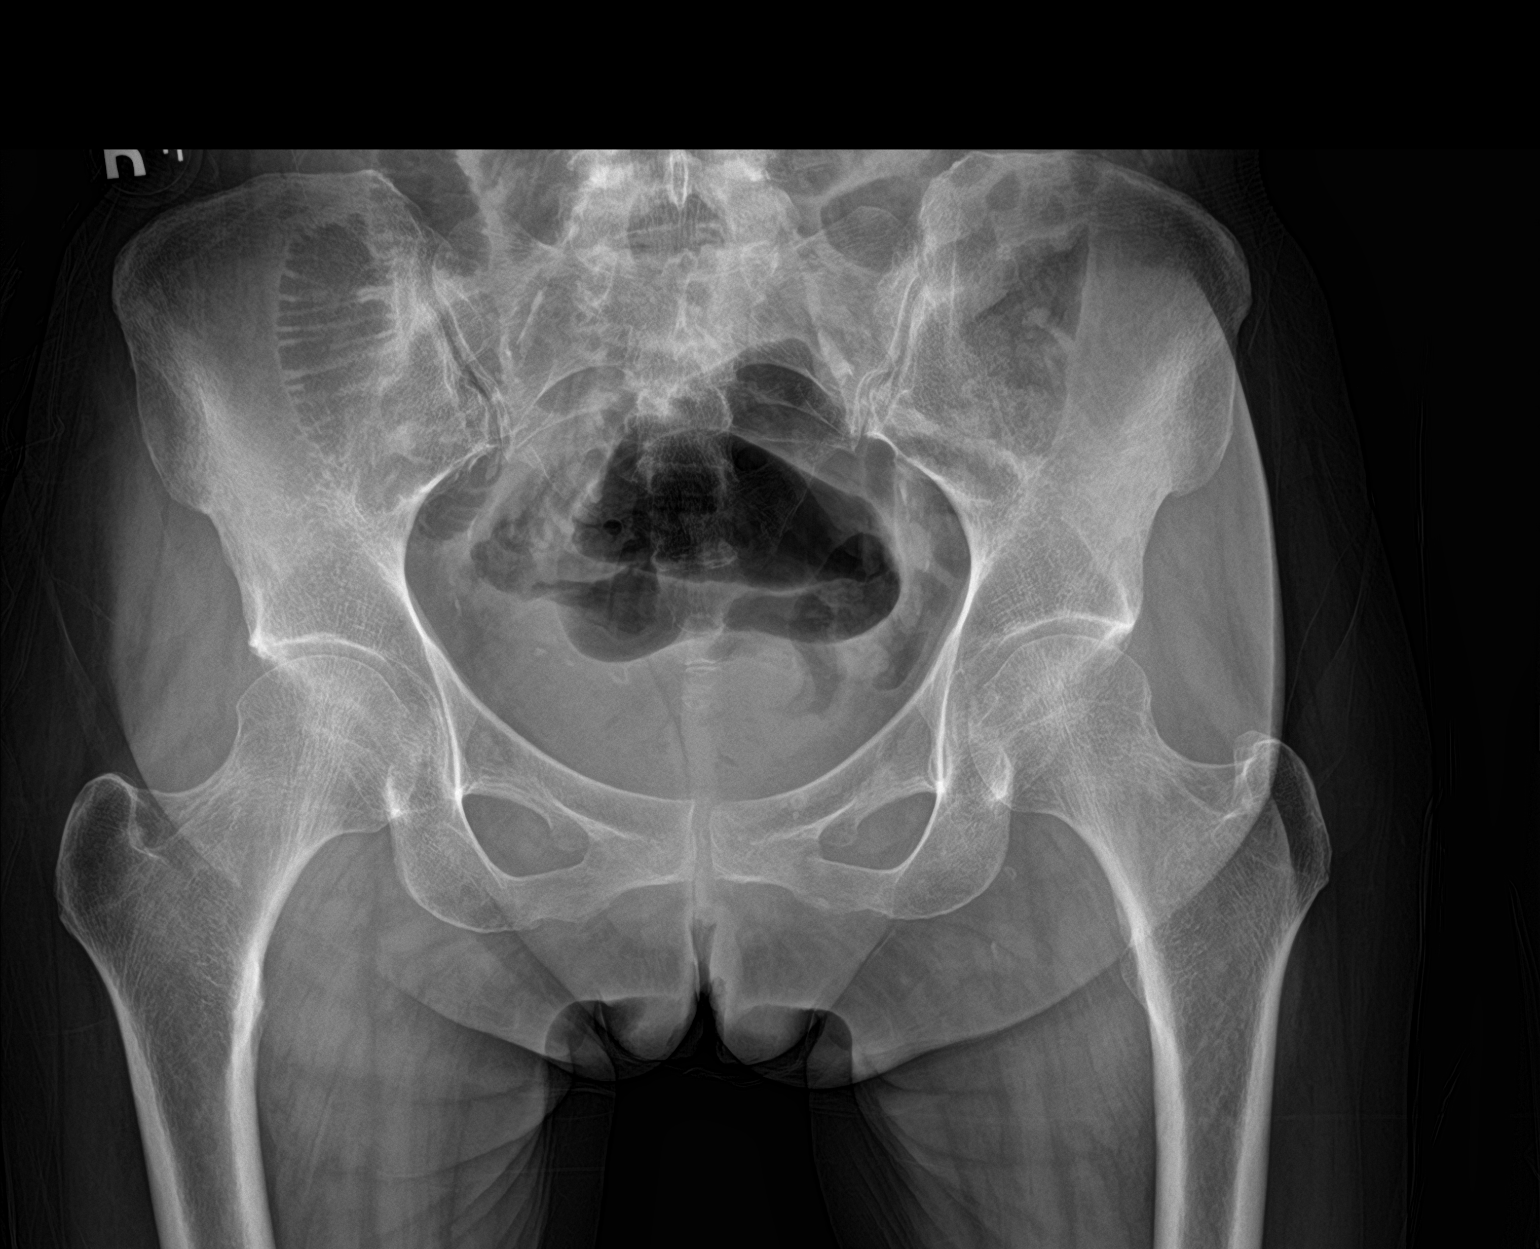

[hip ap]
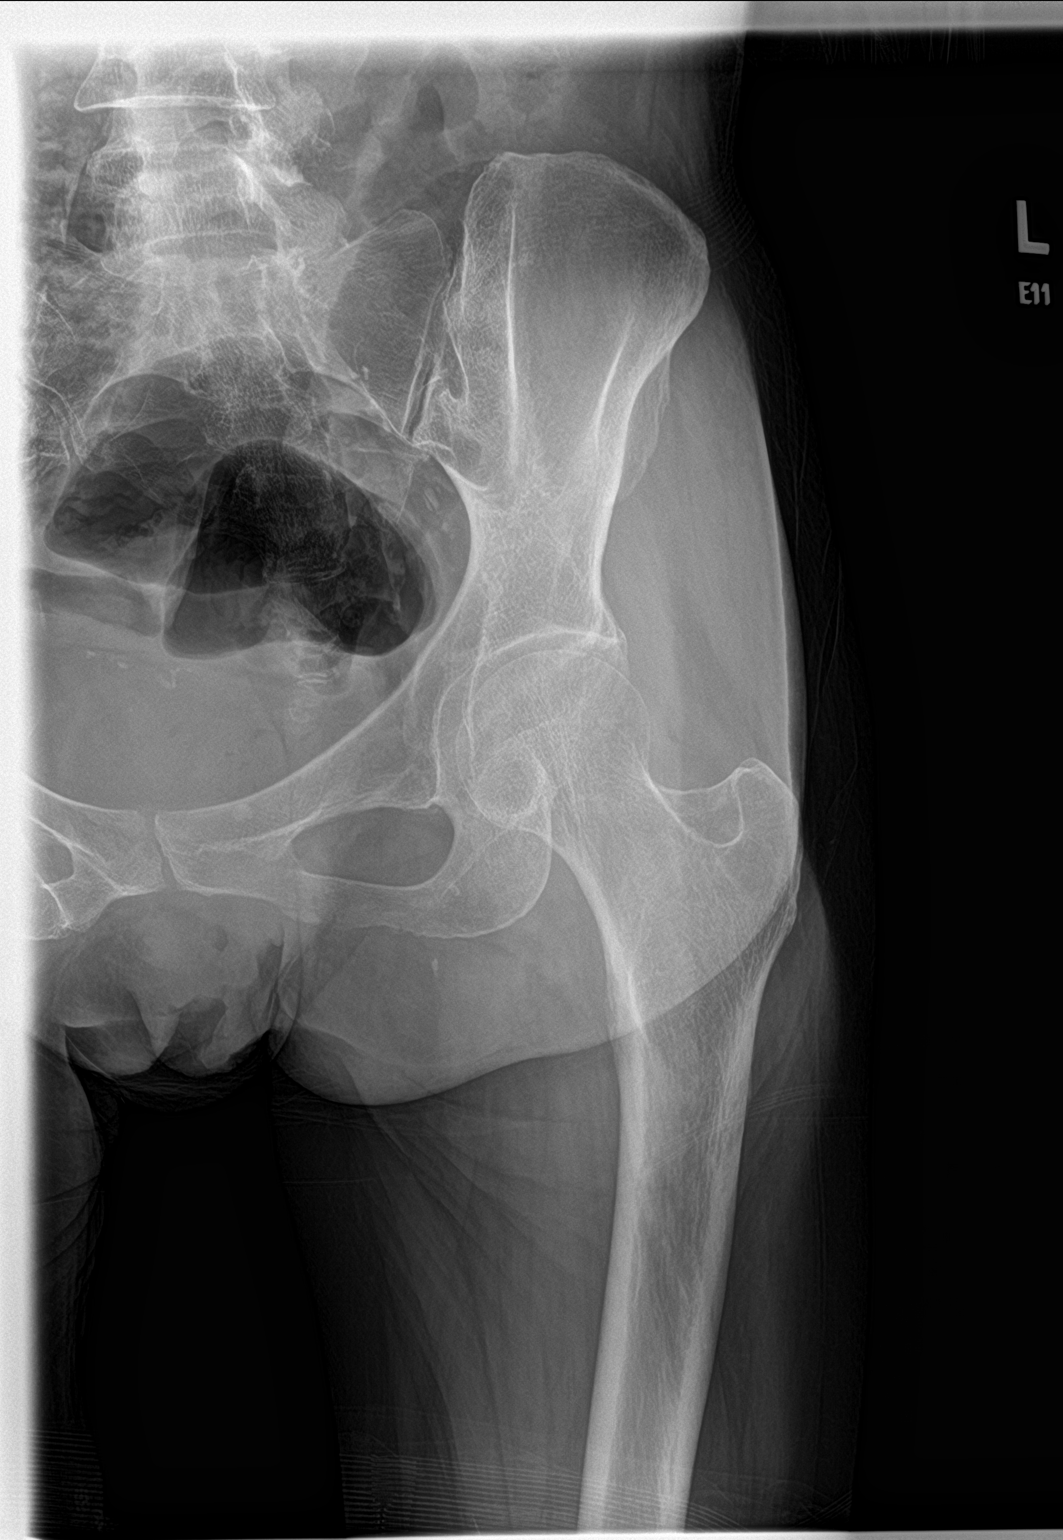

[hip lat]
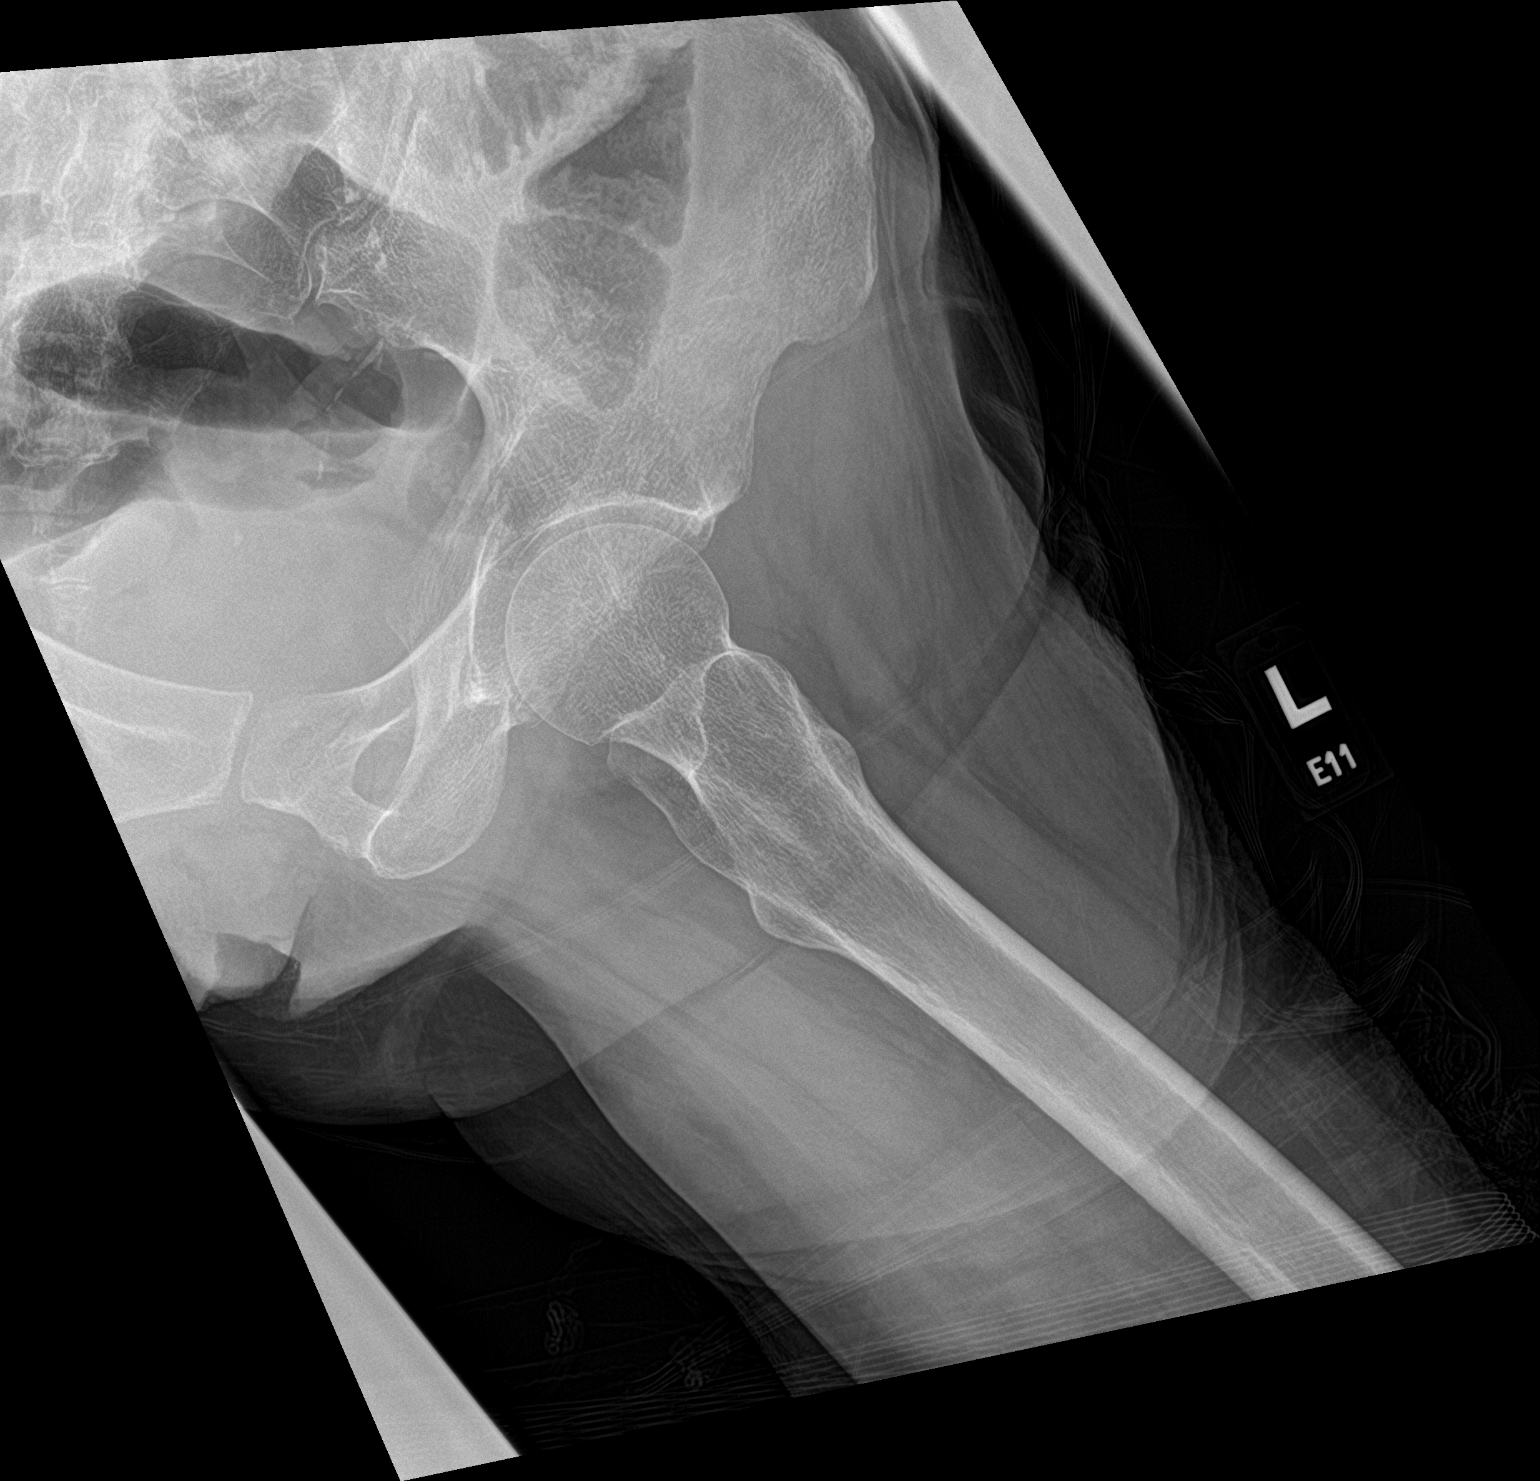

[3 of 3 positions shown; findings below may reference images not displayed]

FINDINGS: Air-filled loops of small and large bowel noted. Adynamic ileus can
not be excluded. Abdominal series suggested for further evaluation.
Pelvic calcifications consistent phleboliths. Aortoiliac
atherosclerotic vascular calcification. Probable small bone island
left pubis. Degenerative changes lumbar spine and both hips. No
acute bony abnormality identified. No evidence of fracture or
dislocation. Left hip is intact.
IMPRESSION: 1. Air-filled loops of small and large bowel noted. Adynamic ileus
can not be excluded. Abdominal series suggested for further
evaluation.

2.  No acute bony abnormality identified.  Left hip is intact.

3.  Aortoiliac atherosclerotic vascular disease.

## 2023-06-22 ENCOUNTER — Ambulatory Visit: Payer: BC Managed Care – PPO | Admitting: Internal Medicine

## 2023-06-22 DIAGNOSIS — E538 Deficiency of other specified B group vitamins: Secondary | ICD-10-CM

## 2023-06-22 NOTE — Progress Notes (Signed)
Patient with Crohn's and prior ileocecectomy.  Disease related B12 deficiency.  Receives regular B12 shots, including today.

## 2023-06-22 NOTE — Progress Notes (Signed)
B12 injection Monthly

## 2023-06-30 ENCOUNTER — Other Ambulatory Visit: Payer: Self-pay | Admitting: Internal Medicine

## 2023-06-30 DIAGNOSIS — R7989 Other specified abnormal findings of blood chemistry: Secondary | ICD-10-CM

## 2023-07-03 ENCOUNTER — Other Ambulatory Visit: Payer: Self-pay | Admitting: Internal Medicine

## 2023-07-11 NOTE — Progress Notes (Deleted)
    SUBJECTIVE:   CHIEF COMPLAINT / HPI:   T2DM- due for A1c.   HCM Lung cancer screening-  Flu vaccine COVID vaccine Skin check  PERTINENT  PMH / PSH: Crohn's disease, depression, severe peripheral arterial disease, diet controlled T2DM  OBJECTIVE:   BP 127/64   Pulse 65   Ht 5\' 6"  (1.676 m)   Wt 121 lb (54.9 kg)   SpO2 98%   BMI 19.53 kg/m   ***  ASSESSMENT/PLAN:   No problem-specific Assessment & Plan notes found for this encounter.     Billey Co, MD [facility]

## 2023-07-12 ENCOUNTER — Ambulatory Visit (INDEPENDENT_AMBULATORY_CARE_PROVIDER_SITE_OTHER): Payer: BC Managed Care – PPO | Admitting: Family Medicine

## 2023-07-12 ENCOUNTER — Encounter: Payer: Self-pay | Admitting: Family Medicine

## 2023-07-12 VITALS — BP 127/64 | HR 65 | Ht 66.0 in | Wt 121.0 lb

## 2023-07-12 DIAGNOSIS — L820 Inflamed seborrheic keratosis: Secondary | ICD-10-CM | POA: Diagnosis not present

## 2023-07-12 DIAGNOSIS — L570 Actinic keratosis: Secondary | ICD-10-CM

## 2023-07-12 DIAGNOSIS — N1831 Chronic kidney disease, stage 3a: Secondary | ICD-10-CM | POA: Diagnosis not present

## 2023-07-12 DIAGNOSIS — I7409 Other arterial embolism and thrombosis of abdominal aorta: Secondary | ICD-10-CM | POA: Diagnosis not present

## 2023-07-12 DIAGNOSIS — E785 Hyperlipidemia, unspecified: Secondary | ICD-10-CM

## 2023-07-12 DIAGNOSIS — E119 Type 2 diabetes mellitus without complications: Secondary | ICD-10-CM | POA: Diagnosis not present

## 2023-07-12 DIAGNOSIS — I739 Peripheral vascular disease, unspecified: Secondary | ICD-10-CM

## 2023-07-12 LAB — POCT GLYCOSYLATED HEMOGLOBIN (HGB A1C): HbA1c, POC (controlled diabetic range): 7.2 % — AB (ref 0.0–7.0)

## 2023-07-12 MED ORDER — CYANOCOBALAMIN 1000 MCG/ML IJ SOLN: INTRAMUSCULAR | Status: DC

## 2023-07-12 MED ORDER — EMPAGLIFLOZIN 10 MG PO TABS: 10.0000 mg | ORAL_TABLET | Freq: Every day | ORAL | 11 refills | Status: DC

## 2023-07-12 MED ORDER — ASPIRIN 81 MG PO TBEC: 81.0000 mg | DELAYED_RELEASE_TABLET | Freq: Every day | ORAL | 3 refills | Status: AC

## 2023-07-12 NOTE — Assessment & Plan Note (Addendum)
Lipid panel discussed with patient. She defers at this time. Will plan to obtain lipid panel at her next follow up when checking labs to assess kidney function after starting Jardiance.

## 2023-07-12 NOTE — Progress Notes (Signed)
   SUBJECTIVE:   CHIEF COMPLAINT / HPI:   DRENDA SCHIEMANN is a 57 y.o. female presenting for annual exam.   Diabetes Management: Patient is currently diet controlled. Patient reports last eye exam about 2 years ago.   Medication Refill: Patient requests refill on aspirin.  Chron's Disease: Patient's reports doing well and being followed by GI.  Health Maintenance: Patient is due for mammogram in October. Patient reports she received a letter in the mail yesterday to schedule an appointment.   PERTINENT  PMH / PSH: Severe PAD, HLD, former tobacco use  OBJECTIVE:   BP 127/64   Pulse 65   Ht 5\' 6"  (1.676 m)   Wt 121 lb (54.9 kg)   SpO2 98%   BMI 19.53 kg/m    General: NAD, pleasant, cooperative Cardiac: RRR, no murmurs Respiratory: Clear to auscultation bilaterally, normal work of breathing Extremities: 2+ dp equal bilaterally Skin: Dry, excoriated, raised brown lesions on mid-chest, R calf and L dorsal foot. Scaly, flesh colored lesions on L forarm and L dorsal hand. Healing lesion on L upper thigh from previous cryotherapy  Diabetic foot exam was performed with the following findings:   No deformities, ulcerations, or other skin breakdown Normal sensation of 10g monofilament Intact posterior tibialis and dorsalis pedis pulses    ASSESSMENT/PLAN:   Controlled type 2 diabetes mellitus without complication, without long-term current use of insulin (HCC) A1c 7.2, increased from 6.7 in August 2023. Patient is currently diet controlled. In March 2024, patient's creatinine increased to 1.11 and GFR decreased to 55.03. Given decreased renal function and increased A1c, starting SGLT2 was discussed with patient. Jardiance 10 mg sent to pharmacy.   Hyperlipidemia LDL goal <70 Lipid panel discussed with patient. She defers at this time. Will plan to obtain lipid panel at her next follow up when checking labs to assess kidney function after starting Jardiance.   Actinic keratosis On  L forearm. Cryotherapy x 1 performed in office today, tolerated well. Procedure form filled out.   Seborrheic keratosis, inflamed On mid-chest, R calf and L hand, sometimes itchy and bothersome. Cryotherapy x 3 performed in office today, tolerated well. Procedure form filled out. Cryotherapy was not performed on L foot lesion given patient's hx of severe PAD. Aquaphor recommended for lesion. Pictures of L foot and L thigh lesions uploaded to patient's chart.    Severe peripheral arterial disease (HCC) Aspirin 81mg  PO QD recommended by vascular surgeon, so Aspirin 81 mg sent to pharmacy. Continue aspirin and statin. Continue following with vascular surgery.   Health Maintenance:  - Patient plans to schedule mammogram  - Patient qualifies for low dose CT screening given smoking history. Patient defers at this time - Foot exam performed today with no abnormalities   FOLLOW-UP: Patient to return to clinic in 1 month for follow up on kidney function, foot lesion and lipid panel.  Bubba Hales, Medical Student [facility]

## 2023-07-12 NOTE — Assessment & Plan Note (Addendum)
A1c 7.2, increased from 6.7 in August 2023. Patient is currently diet controlled. In March 2024, patient's creatinine increased to 1.11 and GFR decreased to 55.03. Given decreased renal function and increased A1c, starting SGLT2 was discussed with patient. Jardiance 10 mg sent to pharmacy.

## 2023-07-12 NOTE — Assessment & Plan Note (Addendum)
On L forearm. Cryotherapy x 1 performed in office today, tolerated well. Procedure form filled out.

## 2023-07-12 NOTE — Assessment & Plan Note (Signed)
Aspirin 81mg  PO QD recommended by vascular surgeon, so Aspirin 81 mg sent to pharmacy. Continue aspirin and statin. Continue following with vascular surgery.

## 2023-07-12 NOTE — Assessment & Plan Note (Addendum)
On mid-chest, R calf and L hand, sometimes itchy and bothersome. Cryotherapy x 3 performed in office today, tolerated well. Procedure form filled out. Cryotherapy was not performed on L foot lesion given patient's hx of severe PAD. Aquaphor recommended for lesion. Pictures of L foot and L thigh lesions uploaded to patient's chart.

## 2023-07-12 NOTE — Patient Instructions (Addendum)
It was wonderful to see you today.  Please bring ALL of your medications with you to every visit.   Today we talked about:  We are starting Jardiance 10 mg daily, this is to protect your kidneys. It may cause increased urination, and increased risk of vaginal yeast infections or UTIs. If you notice these symptoms please stop the medication and call our office. Please make a lab visit on your way out in 1 month to recheck your kidney function on this.   Please restart your aspirin but at the lower dose 81 mg per your vascular surgeon- we sent this to your pharmacy.  We did a skin check. We froze one pre-cancerous area on your left elbow and several inflammed seborrheic keratoses on your chest, leg, and arm. You may see a blister- if you get surrounding redness, drainage, pain, or streaking please call our office immediately.  For the spot on your foot, please put Vaseline on it twice daily and come back in one month for me to recheck it- we may need to send you to someone to have it removed but right now it is too inflamed for me to tell. Please stop using the wart remover on it.   Please schedule your mammogram. You can call [facility] at [phone] to schedule this.   If you change your mind about a CT lung screening for lung cancer please send me a Mychart message and we can order this for you.  Thank you for choosing [facility].   Please call [phone] with any questions about today's appointment.  Please arrive at least 15 minutes prior to your scheduled appointments.   If you had blood work today, I will send you a MyChart message or a letter if results are normal. Otherwise, I will give you a call.   If you had a referral placed, they will call you to set up an appointment. Please give [place] a call if you don't hear back in the next 2 weeks.   If you need additional refills before your next appointment, please call your pharmacy  first.   Burley Saver, MD  Family Medicine

## 2023-07-13 LAB — MICROALBUMIN / CREATININE URINE RATIO
Creatinine, Urine: 95.7 mg/dL
Microalb/Creat Ratio: 6 mg/g{creat} (ref 0–29)
Microalbumin, Urine: 6.1 ug/mL

## 2023-07-18 ENCOUNTER — Other Ambulatory Visit: Payer: Self-pay | Admitting: Family Medicine

## 2023-07-18 DIAGNOSIS — Z0000 Encounter for general adult medical examination without abnormal findings: Secondary | ICD-10-CM

## 2023-07-20 ENCOUNTER — Other Ambulatory Visit: Payer: Self-pay

## 2023-07-20 ENCOUNTER — Telehealth: Payer: Self-pay

## 2023-07-20 DIAGNOSIS — E538 Deficiency of other specified B group vitamins: Secondary | ICD-10-CM

## 2023-07-20 DIAGNOSIS — K50919 Crohn's disease, unspecified, with unspecified complications: Secondary | ICD-10-CM

## 2023-07-20 NOTE — Telephone Encounter (Signed)
-----   Message from Nurse Patty P sent at 01/16/2023  3:11 PM EDT ----- See 3/12 lab results pt needs repeat labs

## 2023-07-20 NOTE — Telephone Encounter (Signed)
Lab orders in epic. Pt states she will come Tuesday to have labs drawn.

## 2023-07-24 ENCOUNTER — Other Ambulatory Visit: Payer: Self-pay

## 2023-07-24 ENCOUNTER — Ambulatory Visit (INDEPENDENT_AMBULATORY_CARE_PROVIDER_SITE_OTHER): Payer: BC Managed Care – PPO | Admitting: Internal Medicine

## 2023-07-24 ENCOUNTER — Other Ambulatory Visit (INDEPENDENT_AMBULATORY_CARE_PROVIDER_SITE_OTHER): Payer: BC Managed Care – PPO

## 2023-07-24 DIAGNOSIS — E538 Deficiency of other specified B group vitamins: Secondary | ICD-10-CM

## 2023-07-24 DIAGNOSIS — D649 Anemia, unspecified: Secondary | ICD-10-CM

## 2023-07-24 DIAGNOSIS — K50919 Crohn's disease, unspecified, with unspecified complications: Secondary | ICD-10-CM

## 2023-07-24 LAB — COMPREHENSIVE METABOLIC PANEL
ALT: 12 U/L (ref 0–35)
AST: 15 U/L (ref 0–37)
Albumin: 4.1 g/dL (ref 3.5–5.2)
Alkaline Phosphatase: 90 U/L (ref 39–117)
BUN: 11 mg/dL (ref 6–23)
CO2: 32 meq/L (ref 19–32)
Calcium: 9.9 mg/dL (ref 8.4–10.5)
Chloride: 100 meq/L (ref 96–112)
Creatinine, Ser: 0.99 mg/dL (ref 0.40–1.20)
GFR: 62.89 mL/min (ref >60.00)
Glucose, Bld: 155 mg/dL — ABNORMAL HIGH (ref 70–99)
Potassium: 4.4 meq/L (ref 3.5–5.1)
Sodium: 138 meq/L (ref 135–145)
Total Bilirubin: 1 mg/dL (ref 0.2–1.2)
Total Protein: 7.1 g/dL (ref 6.0–8.3)

## 2023-07-24 LAB — CBC WITH DIFFERENTIAL/PLATELET
Basophils Absolute: 0.1 10*3/uL (ref 0.0–0.1)
Basophils Relative: 0.7 % (ref 0.0–3.0)
Eosinophils Absolute: 0.1 10*3/uL (ref 0.0–0.7)
Eosinophils Relative: 1.2 % (ref 0.0–5.0)
HCT: 40.9 % (ref 36.0–46.0)
Hemoglobin: 13.5 g/dL (ref 12.0–15.0)
Lymphocytes Relative: 21.9 % (ref 12.0–46.0)
Lymphs Abs: 1.8 10*3/uL (ref 0.7–4.0)
MCHC: 33 g/dL (ref 30.0–36.0)
MCV: 95.4 fl (ref 78.0–100.0)
Monocytes Absolute: 0.6 10*3/uL (ref 0.1–1.0)
Monocytes Relative: 7.5 % (ref 3.0–12.0)
Neutro Abs: 5.6 10*3/uL (ref 1.4–7.7)
Neutrophils Relative %: 68.7 % (ref 43.0–77.0)
Platelets: 188 10*3/uL (ref 150.0–400.0)
RBC: 4.28 Mil/uL (ref 3.87–5.11)
RDW: 14.3 % (ref 11.5–15.5)
WBC: 8.2 10*3/uL (ref 4.0–10.5)

## 2023-07-24 LAB — C-REACTIVE PROTEIN: CRP: <1 mg/dL (ref 0.5–20.0)

## 2023-07-24 LAB — VITAMIN B12: Vitamin B-12: >1501 pg/mL — ABNORMAL HIGH (ref 211–911)

## 2023-07-25 NOTE — Progress Notes (Signed)
Patient with Crohn's presents today for nursing administered B12 shot due to Crohn's related B12 deficiency

## 2023-07-29 ENCOUNTER — Other Ambulatory Visit: Payer: Self-pay | Admitting: Family Medicine

## 2023-07-29 DIAGNOSIS — I739 Peripheral vascular disease, unspecified: Secondary | ICD-10-CM

## 2023-07-29 DIAGNOSIS — F4329 Adjustment disorder with other symptoms: Secondary | ICD-10-CM

## 2023-08-12 IMAGING — CR DG CHEST 2V
3 series · 4 of 4 positions shown · non-contrast
Comparison: 10/06/2021

CLINICAL DATA: Post aortobifemoral bypass surgery

EXAM:
CHEST - 2 VIEW

[Series 2: chest lat · 0.14mm/px · 2 of 2 slices shown]
[im 1/2]
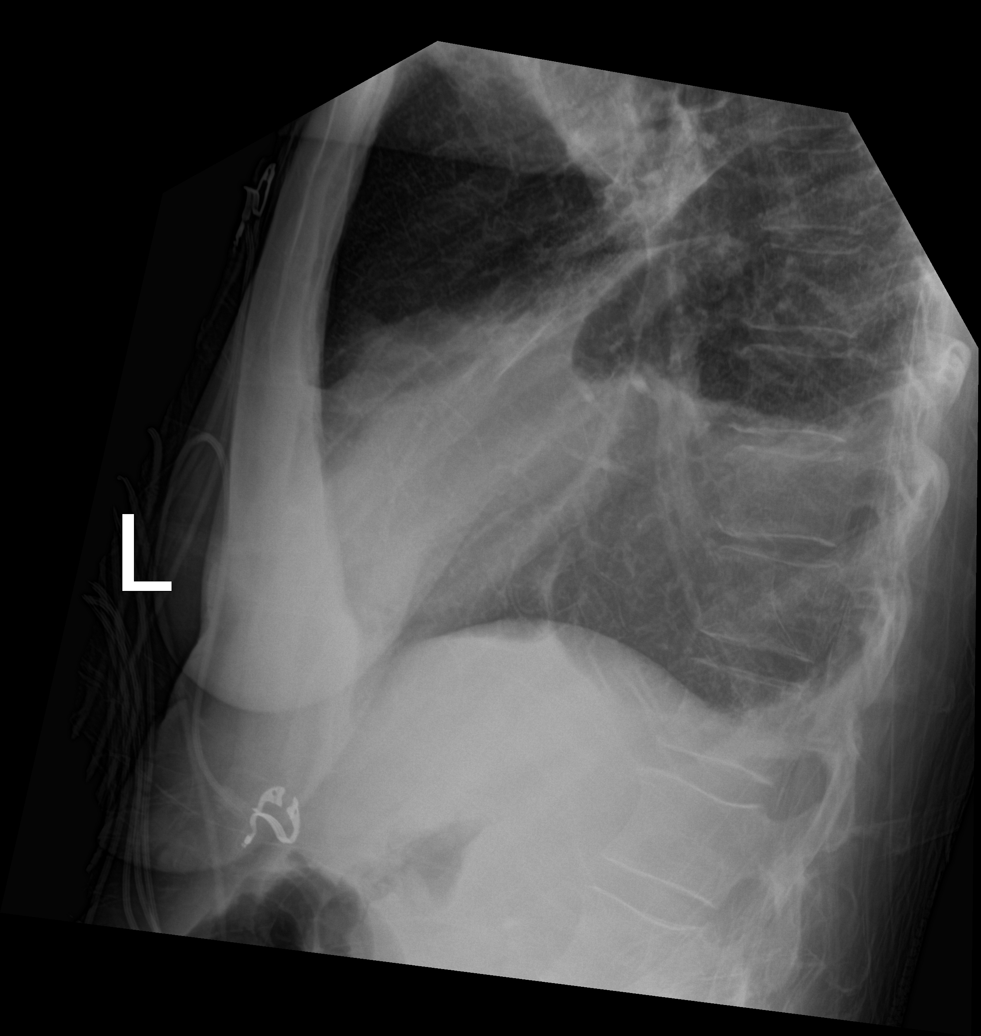
[im 2/2]
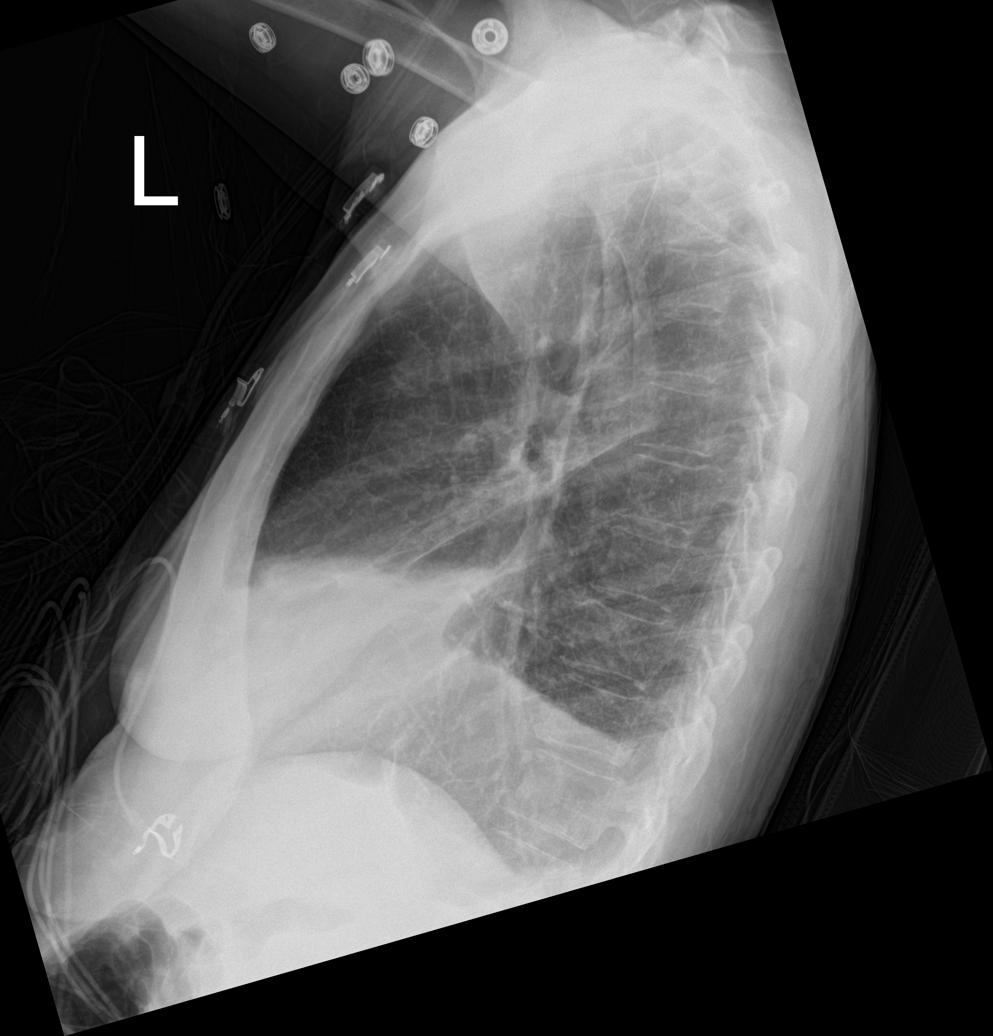

[chest ap (1 of 2)]
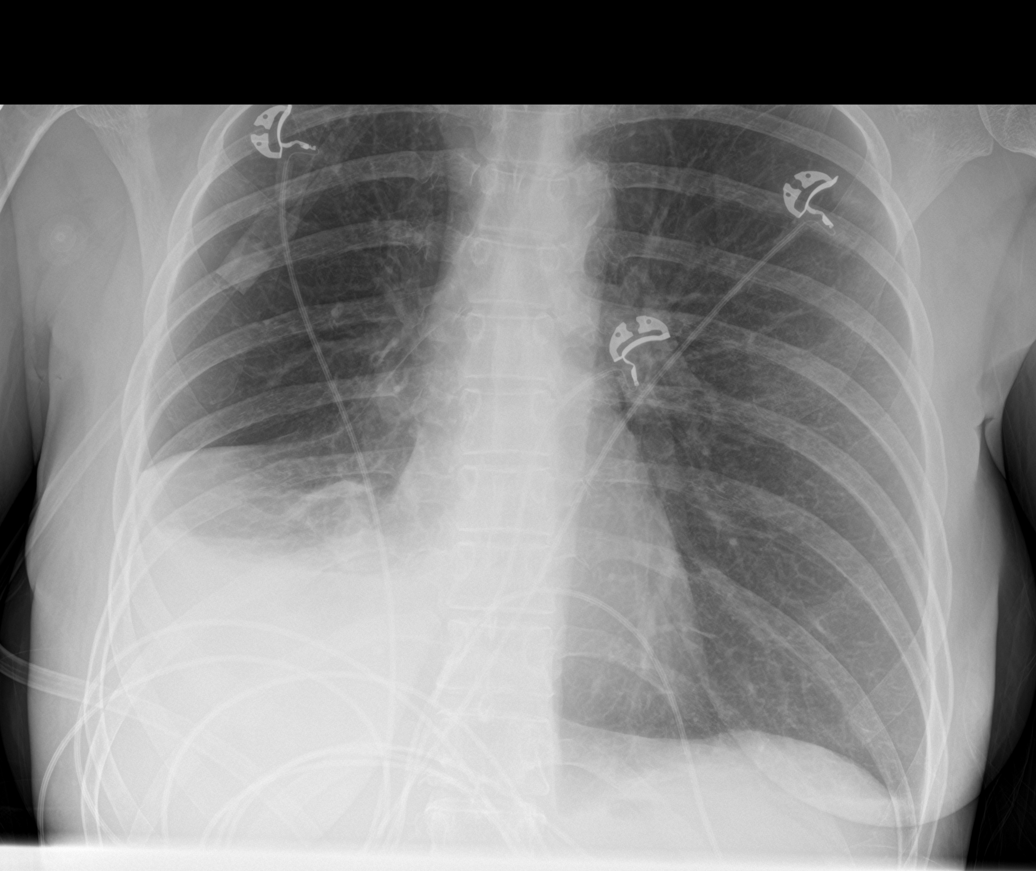

[chest ap (2 of 2)]
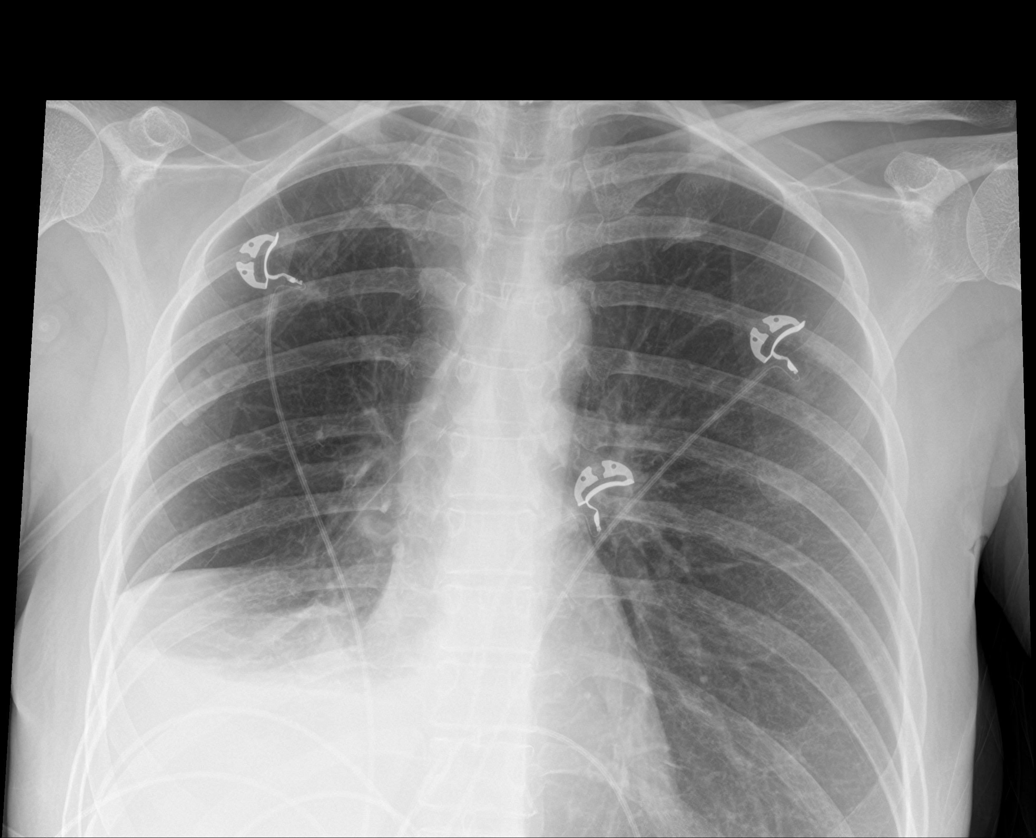

[4 of 4 positions shown; findings below may reference images not displayed]

FINDINGS: New right basilar pleuroparenchymal opacity likely reflecting
combination of effusion and lower and middle lobe atelectasis. Left
lung remains clear. No pneumothorax. Normal heart size.
IMPRESSION: New right pleural effusion and adjacent right lower and middle lobe
atelectasis.

## 2023-08-13 ENCOUNTER — Ambulatory Visit
Admission: RE | Admit: 2023-08-13 | Discharge: 2023-08-13 | Disposition: A | Discharge status: Home / Self Care | Payer: BC Managed Care – PPO | Source: Ambulatory Visit | Attending: Family Medicine | Admitting: Family Medicine

## 2023-08-13 DIAGNOSIS — Z0000 Encounter for general adult medical examination without abnormal findings: Secondary | ICD-10-CM

## 2023-08-13 DIAGNOSIS — Z1231 Encounter for screening mammogram for malignant neoplasm of breast: Secondary | ICD-10-CM | POA: Diagnosis not present

## 2023-08-20 ENCOUNTER — Ambulatory Visit: Payer: BC Managed Care – PPO | Admitting: Family Medicine

## 2023-08-20 VITALS — BP 125/80 | HR 88 | Ht 66.0 in | Wt 120.0 lb

## 2023-08-20 DIAGNOSIS — L821 Other seborrheic keratosis: Secondary | ICD-10-CM | POA: Diagnosis not present

## 2023-08-20 DIAGNOSIS — E119 Type 2 diabetes mellitus without complications: Secondary | ICD-10-CM

## 2023-08-20 DIAGNOSIS — N1831 Chronic kidney disease, stage 3a: Secondary | ICD-10-CM | POA: Diagnosis not present

## 2023-08-20 DIAGNOSIS — Z7984 Long term (current) use of oral hypoglycemic drugs: Secondary | ICD-10-CM

## 2023-08-20 MED ORDER — EMPAGLIFLOZIN 10 MG PO TABS: 10.0000 mg | ORAL_TABLET | Freq: Every day | ORAL | 3 refills | Status: DC

## 2023-08-20 NOTE — Progress Notes (Signed)
    SUBJECTIVE:   CHIEF COMPLAINT / HPI:   CKD3a, T2DM- started on Jardiance 10mg  daily last visit. Denies UTI or yeast infection symptoms. Notes she might urinate a little more but does not bother her. Has not gotten eye exam yet  Foot lesion- stopped placing wart remover on it. Does not itch, bleed, bother or catch on anything. Has just been placing Vaseline on it.   R calf lesion, chest, L forearm and L hand lesions frozen- blistered and fell off and now improved  Lung cancer screening- mentioned today.   OBJECTIVE:   BP 125/80   Pulse 88   Ht 5\' 6"  (1.676 m)   Wt 120 lb (54.4 kg)   SpO2 97%   BMI 19.37 kg/m   General: alert & oriented, no apparent distress, well groomed HEENT: normocephalic, atraumatic, EOM grossly intact, oral mucosa moist, neck supple Respiratory: normal respiratory effort GI: non-distended Skin: on top of left foot, warty stuck on homogenous brown lesion. On left arm, chest, and back of R calf, well healed without scale Psych: appropriate mood and affect   ASSESSMENT/PLAN:   Assessment & Plan CKD stage 3a, GFR 45-59 ml/min (HCC) Tolerating Jardiace well, repeat BMP today Controlled type 2 diabetes mellitus without complication, without long-term current use of insulin (HCC) On jardiance, recommended eye exam Seborrheic keratosis Left foot lesion c/w seborrheic keratosis, reassurance provided, if not bothering or catching no need for removal at this time, discussed features to watch for, previously frozen lesions well healed and no signs of precancerous lesions     Billey Co, MD [facility]

## 2023-08-20 NOTE — Assessment & Plan Note (Signed)
On jardiance, recommended eye exam

## 2023-08-20 NOTE — Patient Instructions (Signed)
It was wonderful to see you today.  Please bring ALL of your medications with you to every visit.   Today we talked about:  I am glad the [place] is going well, we will recheck your kidney function today.  If you decide you want the low radiation lung CT to screen for lung cancer or the Shingles vaccine just give me a call!  Thank you for choosing [facility].   Please call [phone] with any questions about today's appointment.  Please arrive at least 15 minutes prior to your scheduled appointments.   If you had blood work today, I will send you a MyChart message or a letter if results are normal. Otherwise, I will give you a call.   If you had a referral placed, they will call you to set up an appointment. Please give [place] a call if you don't hear back in the next 2 weeks.   If you need additional refills before your next appointment, please call your pharmacy first.   Burley Saver, MD  Family Medicine

## 2023-08-22 NOTE — Addendum Note (Signed)
Addended by: Burley Saver E on: 08/22/2023 02:29 PM   Modules accepted: Level of Service

## 2023-08-24 ENCOUNTER — Encounter: Payer: Self-pay | Admitting: Family Medicine

## 2023-08-24 ENCOUNTER — Other Ambulatory Visit: Payer: Self-pay | Admitting: Family Medicine

## 2023-08-24 ENCOUNTER — Ambulatory Visit (INDEPENDENT_AMBULATORY_CARE_PROVIDER_SITE_OTHER): Payer: BC Managed Care – PPO | Admitting: Internal Medicine

## 2023-08-24 DIAGNOSIS — E119 Type 2 diabetes mellitus without complications: Secondary | ICD-10-CM

## 2023-08-24 DIAGNOSIS — E538 Deficiency of other specified B group vitamins: Secondary | ICD-10-CM

## 2023-08-24 LAB — BASIC METABOLIC PANEL
BUN/Creatinine Ratio: 11 (ref 9–23)
BUN: 12 mg/dL (ref 6–24)
CO2: 24 mmol/L (ref 20–29)
Calcium: 10 mg/dL (ref 8.7–10.2)
Chloride: 100 mmol/L (ref 96–106)
Creatinine, Ser: 1.13 mg/dL — ABNORMAL HIGH (ref 0.57–1.00)
Glucose: 116 mg/dL — ABNORMAL HIGH (ref 70–99)
Potassium: 5 mmol/L (ref 3.5–5.2)
Sodium: 142 mmol/L (ref 134–144)
eGFR: 56 mL/min/{1.73_m2} — ABNORMAL LOW (ref >59)

## 2023-08-24 MED ORDER — CYANOCOBALAMIN 1000 MCG/ML IJ SOLN
1000.0000 ug | INTRAMUSCULAR | Status: AC
Start: 2023-08-24 — End: ?
  Administered 2023-08-24 – 2024-09-25 (×11): 1000 ug via INTRAMUSCULAR

## 2023-08-24 NOTE — Progress Notes (Signed)
BMP for future lab appt for CKD 3a and T2DM.

## 2023-08-27 NOTE — Progress Notes (Signed)
Patient with Crohn's and B-12 deficiency presents for nurse visit for B-12 injection

## 2023-09-18 ENCOUNTER — Ambulatory Visit: Payer: BC Managed Care – PPO

## 2023-09-18 DIAGNOSIS — E119 Type 2 diabetes mellitus without complications: Secondary | ICD-10-CM

## 2023-09-19 LAB — BASIC METABOLIC PANEL
BUN/Creatinine Ratio: 11 (ref 9–23)
BUN: 12 mg/dL (ref 6–24)
CO2: 25 mmol/L (ref 20–29)
Calcium: 10.2 mg/dL (ref 8.7–10.2)
Chloride: 100 mmol/L (ref 96–106)
Creatinine, Ser: 1.14 mg/dL — ABNORMAL HIGH (ref 0.57–1.00)
Glucose: 110 mg/dL — ABNORMAL HIGH (ref 70–99)
Potassium: 4.7 mmol/L (ref 3.5–5.2)
Sodium: 143 mmol/L (ref 134–144)
eGFR: 56 mL/min/{1.73_m2} — ABNORMAL LOW (ref >59)

## 2023-09-24 ENCOUNTER — Ambulatory Visit (INDEPENDENT_AMBULATORY_CARE_PROVIDER_SITE_OTHER): Payer: BC Managed Care – PPO | Admitting: Internal Medicine

## 2023-09-24 DIAGNOSIS — E538 Deficiency of other specified B group vitamins: Secondary | ICD-10-CM | POA: Diagnosis not present

## 2023-09-26 NOTE — Progress Notes (Signed)
Patient with Crohn's disease and prior resection.  Subsequent B12 deficiency.  Required replacement therapy.  Presents for nursing visit for B12 injection replacement therapy.

## 2023-10-08 ENCOUNTER — Encounter: Payer: Self-pay | Admitting: Family Medicine

## 2023-10-08 ENCOUNTER — Other Ambulatory Visit: Payer: Self-pay | Admitting: Family Medicine

## 2023-10-08 DIAGNOSIS — E119 Type 2 diabetes mellitus without complications: Secondary | ICD-10-CM

## 2023-10-08 NOTE — Progress Notes (Signed)
Referral to ophtho per patient request

## 2023-10-24 ENCOUNTER — Ambulatory Visit (INDEPENDENT_AMBULATORY_CARE_PROVIDER_SITE_OTHER): Payer: BC Managed Care – PPO | Admitting: Internal Medicine

## 2023-10-24 DIAGNOSIS — K50919 Crohn's disease, unspecified, with unspecified complications: Secondary | ICD-10-CM

## 2023-10-24 DIAGNOSIS — E538 Deficiency of other specified B group vitamins: Secondary | ICD-10-CM

## 2023-10-30 NOTE — Progress Notes (Signed)
Nurse visit for B-12 replacement shot

## 2023-11-19 ENCOUNTER — Encounter (INDEPENDENT_AMBULATORY_CARE_PROVIDER_SITE_OTHER): Payer: Self-pay

## 2023-11-19 ENCOUNTER — Telehealth: Payer: Self-pay

## 2023-11-19 ENCOUNTER — Encounter (INDEPENDENT_AMBULATORY_CARE_PROVIDER_SITE_OTHER): Payer: BC Managed Care – PPO | Admitting: Ophthalmology

## 2023-11-19 DIAGNOSIS — H3581 Retinal edema: Secondary | ICD-10-CM

## 2023-11-19 NOTE — Telephone Encounter (Signed)
 Derek with [facility] LVM on nurse line.   He reports the patient presented today for scheduled DM exam.  He reports she did not have verifiable insurance. When this was discussed with the patient she snatched a paper out of the FO hands and left.   She is now blocked from scheduling with their practice in the future.   Will forward to referral coordinator.  She will need to be referred elsewhere.

## 2023-11-27 ENCOUNTER — Ambulatory Visit: Payer: BC Managed Care – PPO | Admitting: Internal Medicine

## 2023-11-27 DIAGNOSIS — K50919 Crohn's disease, unspecified, with unspecified complications: Secondary | ICD-10-CM

## 2023-11-27 DIAGNOSIS — E538 Deficiency of other specified B group vitamins: Secondary | ICD-10-CM | POA: Diagnosis not present

## 2023-11-27 NOTE — Progress Notes (Signed)
Patient with Crohn's disease and prior resection presents for B12 replacement injection for B12 deficiency

## 2023-12-14 ENCOUNTER — Telehealth: Payer: Self-pay | Admitting: Internal Medicine

## 2023-12-14 NOTE — Telephone Encounter (Signed)
 Inbound call from patient stating that [facility] informed her that her insurance is no longer in network with them and she will not be able to get her Humira  pen with them anymore. Patient is requesting a call to discuss a new pharmacy. Please advise.

## 2023-12-14 NOTE — Telephone Encounter (Signed)
 Left message for pt to call back.  [facility] pharmacy is out of network this year with her insurance and this is where she gets her Humira . Pt will call her insurance company and find out what specialty pharmacy we need to send her prescription to for this year. Pt states she will call back when she has the information.

## 2023-12-14 NOTE — Telephone Encounter (Signed)
 Inbound call from patient returning phone call. Requesting a call back. Please advise, thank you.

## 2023-12-28 ENCOUNTER — Ambulatory Visit: Payer: BC Managed Care – PPO

## 2024-01-01 ENCOUNTER — Ambulatory Visit (INDEPENDENT_AMBULATORY_CARE_PROVIDER_SITE_OTHER): Payer: BC Managed Care – PPO

## 2024-01-01 DIAGNOSIS — E538 Deficiency of other specified B group vitamins: Secondary | ICD-10-CM | POA: Diagnosis not present

## 2024-01-30 ENCOUNTER — Ambulatory Visit (INDEPENDENT_AMBULATORY_CARE_PROVIDER_SITE_OTHER): Payer: BC Managed Care – PPO

## 2024-01-30 ENCOUNTER — Other Ambulatory Visit (INDEPENDENT_AMBULATORY_CARE_PROVIDER_SITE_OTHER)

## 2024-01-30 DIAGNOSIS — E538 Deficiency of other specified B group vitamins: Secondary | ICD-10-CM | POA: Diagnosis not present

## 2024-01-30 DIAGNOSIS — D649 Anemia, unspecified: Secondary | ICD-10-CM

## 2024-01-30 DIAGNOSIS — K50919 Crohn's disease, unspecified, with unspecified complications: Secondary | ICD-10-CM | POA: Diagnosis not present

## 2024-01-30 LAB — COMPREHENSIVE METABOLIC PANEL WITH GFR
ALT: 12 U/L (ref 0–35)
AST: 18 U/L (ref 0–37)
Albumin: 4.7 g/dL (ref 3.5–5.2)
Alkaline Phosphatase: 91 U/L (ref 39–117)
BUN: 9 mg/dL (ref 6–23)
CO2: 29 meq/L (ref 19–32)
Calcium: 10.1 mg/dL (ref 8.4–10.5)
Chloride: 100 meq/L (ref 96–112)
Creatinine, Ser: 1.15 mg/dL (ref 0.40–1.20)
GFR: 52.35 mL/min — ABNORMAL LOW (ref >60.00)
Glucose, Bld: 222 mg/dL — ABNORMAL HIGH (ref 70–99)
Potassium: 4.3 meq/L (ref 3.5–5.1)
Sodium: 139 meq/L (ref 135–145)
Total Bilirubin: 1 mg/dL (ref 0.2–1.2)
Total Protein: 7.5 g/dL (ref 6.0–8.3)

## 2024-01-30 LAB — CBC WITH DIFFERENTIAL/PLATELET
Basophils Absolute: 0.1 10*3/uL (ref 0.0–0.1)
Basophils Relative: 1 % (ref 0.0–3.0)
Eosinophils Absolute: 0.1 10*3/uL (ref 0.0–0.7)
Eosinophils Relative: 1 % (ref 0.0–5.0)
HCT: 43 % (ref 36.0–46.0)
Hemoglobin: 14.2 g/dL (ref 12.0–15.0)
Lymphocytes Relative: 19.6 % (ref 12.0–46.0)
Lymphs Abs: 1.6 10*3/uL (ref 0.7–4.0)
MCHC: 33 g/dL (ref 30.0–36.0)
MCV: 96.9 fl (ref 78.0–100.0)
Monocytes Absolute: 0.6 10*3/uL (ref 0.1–1.0)
Monocytes Relative: 7.1 % (ref 3.0–12.0)
Neutro Abs: 5.8 10*3/uL (ref 1.4–7.7)
Neutrophils Relative %: 71.3 % (ref 43.0–77.0)
Platelets: 175 10*3/uL (ref 150.0–400.0)
RBC: 4.44 Mil/uL (ref 3.87–5.11)
RDW: 14.8 % (ref 11.5–15.5)
WBC: 8.1 10*3/uL (ref 4.0–10.5)

## 2024-01-30 LAB — C-REACTIVE PROTEIN: CRP: <1 mg/dL (ref 0.5–20.0)

## 2024-01-31 ENCOUNTER — Other Ambulatory Visit: Payer: Self-pay | Admitting: Internal Medicine

## 2024-01-31 LAB — VITAMIN B12: Vitamin B-12: >1537 pg/mL — ABNORMAL HIGH (ref 211–911)

## 2024-02-01 ENCOUNTER — Encounter: Payer: Self-pay | Admitting: Internal Medicine

## 2024-02-03 ENCOUNTER — Encounter: Payer: Self-pay | Admitting: Internal Medicine

## 2024-02-04 MED ORDER — HUMIRA (2 PEN) 40 MG/0.4ML ~~LOC~~ AJKT: 40.0000 mg | AUTO-INJECTOR | SUBCUTANEOUS | 5 refills | Status: DC

## 2024-02-05 ENCOUNTER — Other Ambulatory Visit (HOSPITAL_COMMUNITY): Payer: Self-pay

## 2024-02-08 ENCOUNTER — Other Ambulatory Visit (HOSPITAL_COMMUNITY): Payer: Self-pay

## 2024-02-08 ENCOUNTER — Telehealth: Payer: Self-pay

## 2024-02-08 NOTE — Telephone Encounter (Signed)
 Pharmacy Patient Advocate Encounter   Received notification from Patient Pharmacy that prior authorization for Humira (2 Pen) (CF) 40MG /0.4ML auto-injector kit is required/requested.   Insurance verification completed.   The patient is insured through [facility] .   Per test claim: PA required; PA submitted to above mentioned insurance via Prompt PA Key/confirmation #/[id number] Status is pending

## 2024-02-12 ENCOUNTER — Other Ambulatory Visit (HOSPITAL_COMMUNITY): Payer: Self-pay

## 2024-02-12 NOTE — Telephone Encounter (Signed)
 Contacted patient to advise that insurance requires [place] to have documented visit showing improvement on Humira prior to approving the medication. Unfortunately, we have not seen her in the office recently with the exception of B12 injections. Patient has scheduled a routine follow up with Alcide Evener, NP at which time we will resubmit request for Humira.

## 2024-02-12 NOTE — Telephone Encounter (Signed)
 Pharmacy Patient Advocate Encounter  Contacted [employer] due to receiving conflicting faxes. Spoke with rep who stated that the request was received but they are missing information. Information needed include:  - Chart notes within the past 12 months that show improvement on medication. (Last OV was 03.12.24) - List of tried and failed medications before starting therapy with Humira and all reasons why patient cannot take

## 2024-02-26 ENCOUNTER — Other Ambulatory Visit: Payer: Self-pay

## 2024-02-26 ENCOUNTER — Encounter: Payer: Self-pay | Admitting: Nurse Practitioner

## 2024-02-26 ENCOUNTER — Other Ambulatory Visit

## 2024-02-26 ENCOUNTER — Ambulatory Visit: Admitting: Nurse Practitioner

## 2024-02-26 VITALS — BP 110/60 | HR 79 | Ht 66.0 in | Wt 115.0 lb

## 2024-02-26 DIAGNOSIS — K50919 Crohn's disease, unspecified, with unspecified complications: Secondary | ICD-10-CM

## 2024-02-26 DIAGNOSIS — K5000 Crohn's disease of small intestine without complications: Secondary | ICD-10-CM | POA: Diagnosis not present

## 2024-02-26 DIAGNOSIS — E538 Deficiency of other specified B group vitamins: Secondary | ICD-10-CM | POA: Diagnosis not present

## 2024-02-26 DIAGNOSIS — R110 Nausea: Secondary | ICD-10-CM

## 2024-02-26 MED ORDER — CYANOCOBALAMIN 1000 MCG/ML IJ SOLN: INTRAMUSCULAR | 2 refills | Status: DC

## 2024-02-26 NOTE — Patient Instructions (Addendum)
 Your provider has requested that you go to the basement level for lab work before leaving today. Press "B" on the elevator. The lab is located at the first door on the left as you exit the elevator.  Continue Imuran  50 mg  Restart Humira  40 mg subcutaneous every 2 weeks.  Due to recent changes in healthcare laws, you may see the results of your imaging and laboratory studies on MyChart before your provider has had a chance to review them.  We understand that in some cases there may be results that are confusing or concerning to you. Not all laboratory results come back in the same time frame and the provider may be waiting for multiple results in order to interpret others.  Please give us  48 hours in order for your provider to thoroughly review all the results before contacting the office for clarification of your results.   Thank you for trusting me with your gastrointestinal care!   Everett Hitt, CRNP

## 2024-02-26 NOTE — Telephone Encounter (Signed)
 PA team-  Please see updated office notes from 02/26/24. Can we resubmit to insurance to hopefully get Humira  restarted?

## 2024-02-26 NOTE — Progress Notes (Signed)
 02/26/2024 Melissa Chaney [id number] May 06, 1966   Chief Complaint: Crohn's follow up  History of Present Illness:  Melissa Chaney is a 58 with a past medical history of arthritis, depression, peripheral arterial disease s/p aortobifemoral bypass with lysis of adhesions on 09/25/21 with a post-op right groin seroma, B12 deficiency, tubular adenomatous colon polyp and Crohn's disease s/p ileocolectomy 08/2005 and a s/p small bowel resection secondary to SBO 09/2005. She was restarted on 6 Mercaptopurine  and Humira  was initiated in 2017, subsequently stopped these medications due to insurance issues then restarted Humira  40mg  SQ every two weeks and Imuran  50mg  every day 08/2022. She last underwent a colonoscopy March 02, 2022 which showed mild ileal Crohn's disease and the colon was normal. Her last CT enterography was performed February 16, 2022 which was negative for active Crohn's.  She was last seen in office by Dr. Elvin Hammer 01/16/2023, at that time she was adherent with taking Humira  and Imuran  as prescribed and overall felt well with occasional abdominal bloat.  Her laboratory studies were stable and she was advised to follow-up in 6 months. was adherent with taking Humira  and Imuran  as prescribed and overall felt well with occasional abdominal bloat.  Her laboratory studies were stable and she was advised to follow-up in 6 months.  She presents today for further Crohn's follow-up and to refill her Humira  prescription.  She remains on Imuran  50 mg daily, ever, she ran out of Humira  3 months ago in [place] and Rx would not refill her prescription until she was seen in our office for an updated evaluation. She has some nausea without vomiting x 1 1/2 weeks. No GERD or dysphagia. She has chronic intermittent right mid abdominal pain once or twice daily lasts for 5 to 10 minutes then goes away, more noticeable past 1 1/2 week. She is passing soft brown stools 3 days weekly which is her normal bowel pattern.  No bloody or black stools.  No NSAIDs.  She quit smoking cigarettes 09/2021.  She is scheduled for a B12 injection appointment later this week and requests to receive this injection today.       Latest Ref Rng & Units 01/30/2024    9:33 AM 07/24/2023    9:58 AM 01/16/2023   12:30 PM  CBC  WBC 4.0 - 10.5 K/uL 8.1  8.2  10.7   Hemoglobin 12.0 - 15.0 g/dL 40.9  81.1  91.4   Hematocrit 36.0 - 46.0 % 43.0  40.9  40.3   Platelets 150.0 - 400.0 K/uL 175.0  188.0  205.0        Latest Ref Rng & Units 01/30/2024    9:33 AM 09/18/2023    8:51 AM 08/20/2023    6:00 PM  CMP  Glucose 70 - 99 mg/dL 782  956  213   BUN 6 - 23 mg/dL 9  12  12    Creatinine 0.40 - 1.20 mg/dL 0.86  5.78  4.69   Sodium 135 - 145 mEq/L 139  143  142   Potassium 3.5 - 5.1 mEq/L 4.3  4.7  5.0   Chloride 96 - 112 mEq/L 100  100  100   CO2 19 - 32 mEq/L 29  25  24    Calcium  8.4 - 10.5 mg/dL 62.9  52.8  41.3   Total Protein 6.0 - 8.3 g/dL 7.5     Total Bilirubin 0.2 - 1.2 mg/dL 1.0     Alkaline Phos 39 - 117 U/L 91     AST 0 - 37 U/L 18     ALT 0 - 35 U/L 12     CRP less than 1 on 01/30/2024  CT enterography  with contrast 03/21/2017: FINDINGS: Lower chest: No acute findings.A 4 mm nodule of the right lung base is unchanged from previous exam.   Hepatobiliary: There is no focal liver abnormality identified. The gallbladder appears normal. No biliary dilatation identified.   Pancreas: No mass, inflammatory changes, or other significant abnormality.   Spleen: Within normal limits in size and appearance.   Adrenals/Urinary Tract: The adrenal glands appear normal. The kidneys are unremarkable. No mass or hydronephrosis identified. The urinary bladder appears within normal limits.   Stomach/Bowel: The stomach appears within normal limits. No pathologic dilatation of the small bowel loops. Several small areas of mucosal hyperenhancement within the terminal ileum or neo terminal ileum is identified, image 25 of series 7. This appears improved when compared with study from 08/16/2016.   Status post partial right hemicolectomy with enterocolonic anastomosis. Recurrent or persistent mucosal hyperenhancement  with minimal colonic wall thickening is again noted. This is most pronounced distally, for example within the sigmoid colon image 52 of series 7. When compared with 08/16/2016 the degree of colonic wall thickening and mucosal hyperenhancement appears improved.   There is no evidence for penetrating disease, abscess formation or bowel obstruction.   Vascular/Lymphatic: Aortic atherosclerosis. No aneurysm. No upper abdominal adenopathy. No pelvic or inguinal adenopathy.   Reproductive: No mass or other significant abnormality.   Other: No free fluid or fluid collections identified.   Musculoskeletal: No aggressive lytic or sclerotic bone lesions.   IMPRESSION: 1. Active colitis is again noted within the sigmoid colon and rectum, likely related to the clinical history of Crohn's disease. Allowing for differences in technique this is minimally improved when compared with 08/16/2016. 2. Mild inflammatory changes involving the terminal ileum or neo terminal ileum evident by mucosal hyperenhancement without significant bowel wall thickening. Also improved from previous exam. 3. No complicating features identified. Specifically no penetrating disease, abscess or bowel obstruction. 4.  Aortic Atherosclerosis  PAST GI PROCEDURES:   Colonoscopy 08/23/2016: 1. Status post ileocecectomy. Widely patent anastomosis 2. Ulceration on the colonic side of the anastomosis 3. Significant active ileal Crohn's disease 4. No obvious colonic disease. Rectosigmoid region looks fine. ULCERATED BENIGN SMALL BOWEL MUCOSA SHOWING ACTIVE ILEITIS. - NO DYSPLASIA OR MALIGNANCY IDENTIFIED.   Colonoscopy 09/29/2008: Active ileal disease - CHRONIC MILDLY ACTIVE ILEITIS.   Colonoscopy 01/12/2005: Diminutive tubular adenomatous polyp removed from the transverse colon Ileum, ulcers present, stenosis, ulcerated.   Comments: Extended segment with ulceration with edema and stenosis.   Colonoscopy  03/23/1999: Ileal Crohn's with ulceration  Colonoscopy 03/02/2022: 1. Mildly active ileal Crohn's disease 2. Normal colon.    Current Outpatient Medications on File Prior to Visit  Medication Sig Dispense Refill   acetaminophen  (TYLENOL ) 500 MG tablet Take 1 tablet (500 mg total) by mouth every 6 (six) hours as needed. 30 tablet 0   aspirin  EC 81 MG tablet Take 1 tablet (81 mg total) by mouth daily. Swallow whole. 90 tablet 3   atorvastatin  (LIPITOR) 40 MG tablet TAKE 1 TABLET BY MOUTH EVERY DAY 90 tablet 3   azaTHIOprine  (IMURAN ) 50 MG tablet TAKE 1 TABLET BY MOUTH EVERY DAY 90 tablet 3   buPROPion  (WELLBUTRIN  XL) 300 MG 24 hr tablet TAKE 1 TABLET BY MOUTH EVERY DAY 90 tablet 3   Cholecalciferol (D3-50) 1.25 MG (50000 UT) capsule TAKE 1 CAPSULE BY MOUTH ONE TIME PER WEEK 12 capsule 6   cyanocobalamin  (VITAMIN B12) 1000 MCG/ML injection INJECT 1 ML INTO THE MUSCLE AS DIRECTED EVERY Month  diclofenac  Sodium (VOLTAREN ) 1 % GEL Apply 4 g topically 4 (four) times daily. As needed for hip pain 50 g 2   empagliflozin  (JARDIANCE ) 10 MG TABS tablet Take 1 tablet (10 mg total) by mouth daily. 90 tablet 3   adalimumab  (HUMIRA , 2 PEN,) 40 MG/0.4ML pen Inject 0.4 mLs (40 mg total) into the skin every 14 (fourteen) days. (Patient not taking: Reported on 02/26/2024) 2 each 5   Current Facility-Administered Medications on File Prior to Visit  Medication Dose Route Frequency Provider Last Rate Last Admin   cyanocobalamin  ((VITAMIN B-12)) injection 1,000 mcg  1,000 mcg Intramuscular UD Tobin Forts, MD   1,000 mcg at 07/24/23 0930   cyanocobalamin  (VITAMIN B12) injection 1,000 mcg  1,000 mcg Intramuscular Q30 days Tobin Forts, MD   1,000 mcg at 01/30/24 1478   Allergies  Allergen Reactions   Codeine Itching   Current Medications, Allergies, Past Medical History, Past Surgical History, Family History and Social History were reviewed in [employer] electronic medical record.  Review of Systems:    Constitutional: Negative for fever, sweats, chills or weight loss.  Respiratory: Negative for shortness of breath.   Cardiovascular: Negative for chest pain, palpitations and leg swelling.  Gastrointestinal: See HPI.  Musculoskeletal: Negative for back pain or muscle aches.  Neurological: Negative for dizziness, headaches or paresthesias.   Physical Exam: BP 110/60   Pulse 79   Ht 5\' 6"  (1.676 m)   Wt 115 lb (52.2 kg)   BMI 18.56 kg/m   General: 58 year old female in no acute distress. Head: Normocephalic and atraumatic. Eyes: No scleral icterus. Conjunctiva pink . Ears: Normal auditory acuity. Mouth: Dentition intact. No ulcers or lesions.  Lungs: Clear throughout to auscultation. Heart: Regular rate and rhythm, no murmur. Abdomen: Soft, nondistended.  Mild RLQ tenderness without rebound or guarding.  No masses or hepatomegaly. Normal bowel sounds x 4 quadrants.  Midline abdominal scar intact. Rectal: Deferred.  Musculoskeletal: Symmetrical with no gross deformities. Extremities: No edema. Neurological: Alert oriented x 4. No focal deficits.  Psychological: Alert and cooperative. Normal mood and affect  Assessment and Recommendations:  58 year old female with Crohn's disease s/p ileocolectomy 08/2005 prescribed Humira  40mg  SQ every 2 weeks and Imuran  50mg  every day.  She remains adherent with taking Imuran  50 mg daily but has been off Humira  x 3 months. Patient reported [facility] could not send her Humira  prescription until she had an updated office visit. Her most recent colonoscopy March 02, 2022 which showed mild ileal Crohn's disease and the colon was normal. CBC, CMP and CRP stable.  She has intermittent chronic RLQ pain, more notable for the past 1-1/2 weeks. - Fecal calprotectin level - Restart Humira  40 mg subcu every 2 weeks.  Since she has been off Humira  x 3 months, I will consult with Dr. Elvin Hammer to verify if she requires Humira  reinduction dosing - Continue Imuran  50  mg daily - If patient has near future Crohn's flare or worsening abdominal pain she will require CTAP and Humira  antibody/drug levels - Continue monthly vitamin B12 injections  Mild nausea without vomiting - Monitor symptoms, patient to contact office if antiemetic needed.  She is hopeful that her nausea will abate after she restarts Humira .  Peripheral arterial disease s/p aortobifemoral bypass 09/2021 on ASA 81mg  QD

## 2024-02-27 ENCOUNTER — Telehealth: Payer: Self-pay

## 2024-02-27 ENCOUNTER — Other Ambulatory Visit (HOSPITAL_COMMUNITY): Payer: Self-pay

## 2024-02-27 NOTE — Telephone Encounter (Signed)
 Dr Elvin Hammer-  I think the insurance is trying to say they will cover biosimilar Amjevita  instead of Humira . Would you be agreeable to patient using Amjevita  40 mg/0.4 ml every 14 days?

## 2024-02-27 NOTE — Telephone Encounter (Signed)
 Pharmacy Patient Advocate Encounter   Received notification from Pt Calls Messages that prior authorization for  HUMIRA (CF) PEN 40 MG/0.4 ML is required/requested.   Insurance verification completed.   The patient is insured through [facility] .   Per test claim: PA required; PA submitted to above mentioned insurance via Prompt PA Key/confirmation #/EOC [id number] Status is pending

## 2024-02-27 NOTE — Telephone Encounter (Signed)
 Pharmacy Patient Advocate Encounter  Received notification from [employer] that Prior Authorization for  HUMIRA (CF) PEN 40 MG/0.4 ML has been DENIED.  Full denial letter will be uploaded to the media tab. See denial reason below.  Prior authorization requirements have not been met. This medication can only be considered for approval if one of the following is documented: 1) frequent diarrhea and abdominal pain 2) at least 10% weight loss 3) complications such as obstruction, fever, abdominal mass 4) abnormal lab values 5) CD Activity Index greater than 220 as indicated in the patient medical record.  This medication can only be considered for approval if one of the following is documented: 1) the request is for continuation of prior therapy within the last 90 days and the patient demonstrates a positive clinical response to therapy 2) there is documentation confirming a minimum 51-month trial of Amjevita  and objective information documenting a lack of adequate clinical response to Amjevita  3) there is documentation of an allergic reaction to a specific non-active ingredient that is not included in the requested product but is contained in Amjevita  as indicated in the patient medical record  PA #/Case ID/Reference #: [id number]

## 2024-02-27 NOTE — Telephone Encounter (Signed)
 Yes.  Biosimilar is fine.  Thanks for clarifying

## 2024-02-27 NOTE — Telephone Encounter (Signed)
 PA request has been Submitted. New Encounter has been or will be created for follow up. For additional info see Pharmacy Prior Auth telephone encounter from 02-27-2024. Please remember that you only need to route to the pool for your message to be seen. No need to send to pool and me as I am in the pool. Thanks

## 2024-02-27 NOTE — Telephone Encounter (Signed)
 All, This patient has had A very COMPLICATED Crohn's disease. She has had serious obstruction that required surgery. She is at serious risk for relapse with significant complications if she is not on this medication, which has worked previously. Please go through what ever measures are required to correct this unreasonable denial, ASAP. Thanks, Dr. Elvin Hammer

## 2024-02-28 ENCOUNTER — Other Ambulatory Visit (HOSPITAL_COMMUNITY): Payer: Self-pay

## 2024-02-28 ENCOUNTER — Telehealth: Payer: Self-pay

## 2024-02-28 MED ORDER — AMJEVITA 40 MG/0.4ML ~~LOC~~ SOAJ: 40.0000 mg | SUBCUTANEOUS | 5 refills | Status: DC

## 2024-02-28 NOTE — Telephone Encounter (Signed)
 Biosimilar, Amjevita  has been sent to [facility] for every 14 day dosing as per insurance requirement.   I have spoken to patient to advise her of this change from Humira  to biosimilar Amjevita  and explained this is her insurance preference. Patient is advised that she should take Amjevita  just as she would Humira . She verbalizes understanding of this information.

## 2024-02-28 NOTE — Telephone Encounter (Signed)
 Pharmacy Patient Advocate Encounter   Received notification from Pt Calls Messages that prior authorization for AMJEVITA (CF) 40MG /0.4ML AUTOINJ is required/requested.   Insurance verification completed.   The patient is insured through [facility] .   Per test claim: PA required; PA submitted to above mentioned insurance via Prompt PA Key/confirmation #/EOC [id number] Status is pending

## 2024-02-29 ENCOUNTER — Ambulatory Visit

## 2024-02-29 NOTE — Telephone Encounter (Signed)
 Pharmacy Patient Advocate Encounter  Received notification from [employer] that Prior Authorization for AMJEVITA (CF) 40MG /0.4ML AUTOINJ has been APPROVED from 02-28-2024 to 08-29-2024   PA #/Case ID/Reference #: [id number]

## 2024-03-03 ENCOUNTER — Other Ambulatory Visit

## 2024-03-03 DIAGNOSIS — K50919 Crohn's disease, unspecified, with unspecified complications: Secondary | ICD-10-CM

## 2024-03-05 LAB — CALPROTECTIN, FECAL: Calprotectin, Fecal: 308 ug/g — ABNORMAL HIGH (ref 0–120)

## 2024-03-10 ENCOUNTER — Other Ambulatory Visit: Payer: Self-pay | Admitting: *Deleted

## 2024-03-10 DIAGNOSIS — K50919 Crohn's disease, unspecified, with unspecified complications: Secondary | ICD-10-CM

## 2024-03-28 ENCOUNTER — Ambulatory Visit

## 2024-03-28 ENCOUNTER — Ambulatory Visit (INDEPENDENT_AMBULATORY_CARE_PROVIDER_SITE_OTHER)

## 2024-03-28 DIAGNOSIS — E538 Deficiency of other specified B group vitamins: Secondary | ICD-10-CM

## 2024-04-16 NOTE — Progress Notes (Signed)
 Called and reminded patient to come for fecal calprotectin testing this week or next week. She verbalizes understanding.

## 2024-04-24 ENCOUNTER — Other Ambulatory Visit

## 2024-04-28 ENCOUNTER — Other Ambulatory Visit

## 2024-04-28 ENCOUNTER — Ambulatory Visit (INDEPENDENT_AMBULATORY_CARE_PROVIDER_SITE_OTHER)

## 2024-04-28 DIAGNOSIS — E538 Deficiency of other specified B group vitamins: Secondary | ICD-10-CM

## 2024-04-28 DIAGNOSIS — K50919 Crohn's disease, unspecified, with unspecified complications: Secondary | ICD-10-CM

## 2024-04-29 ENCOUNTER — Other Ambulatory Visit: Payer: Self-pay | Admitting: *Deleted

## 2024-04-29 DIAGNOSIS — Z95828 Presence of other vascular implants and grafts: Secondary | ICD-10-CM

## 2024-04-29 DIAGNOSIS — I739 Peripheral vascular disease, unspecified: Secondary | ICD-10-CM

## 2024-04-30 LAB — CALPROTECTIN, FECAL: Calprotectin, Fecal: 139 ug/g — ABNORMAL HIGH (ref 0–120)

## 2024-05-05 ENCOUNTER — Ambulatory Visit: Payer: Self-pay | Admitting: Nurse Practitioner

## 2024-05-12 NOTE — Progress Notes (Unsigned)
 [facility] [place]  ASSESSMENT / PLAN: Melissa Chaney is a 57 y.o. female status post aortobifemoral bypass 10/05/22 for rest pain.  Recommend the following which can slow the progression of atherosclerosis and reduce the risk of major adverse cardiac / limb events:  Complete cessation from all tobacco products. Blood glucose control with goal A1c < 7%. Blood pressure control with goal blood pressure < 140/90 mmHg. Lipid reduction therapy with goal LDL-C <100 mg/dL (<29 if symptomatic from PAD).  Aspirin  81mg  PO QD.  Atorvastatin  40-80mg  PO QD (or other high intensity statin therapy).  Right groin seroma has almost nearly resolved.  She is overall doing excellent.  Will see her again in a year with an ankle-brachial index.  CHIEF COMPLAINT: Bilateral leg pain  HISTORY OF PRESENT ILLNESS: Melissa Chaney is a 58 y.o. female who presents to clinic for evaluation of bilateral leg pain.  The patient reports this is been slowly progressive over the past year.  The patient reports fairly typical symptoms of disabling claudication in her thighs and calves.  She also has pain in her left foot that will occasionally awaken her at night.  He has no ulcers about her feet.  03/05/23: Patient returns to clinic for surveillance after aortobifemoral bypass.  She did very well from the surgery and continues to enjoy primary patency.  She does have a seroma in her right groin, which has persisted since her initial postoperative evaluation.  This is not very bothersome to her.  No signs or symptoms of infection.  05/13/2024: Patient returns for surveillance.  She is overall doing very well.  She looks globally healthier.  She has stopped smoking.  She has been compliant with medical therapy.  She is walking is much as she likes.  The seroma in her right groin is nearly resolved.  VASCULAR SURGICAL HISTORY: none  VASCULAR RISK FACTORS: Negative history of stroke / transient ischemic  attack. Negative history of coronary artery disease.  Negative history of diabetes mellitus.  Positive history of smoking. Not actively smoking. Negative history of hypertension.  Negative history of chronic kidney disease.  Negative history of chronic obstructive pulmonary disease.  FUNCTIONAL STATUS: ECOG performance status: (1) Restricted in physically strenuous activity, ambulatory and able to do work of light nature Ambulatory status: Ambulatory within the community with limits  Past Medical History:  Diagnosis Date   Arthritis    Crohn disease (HCC)    Depression    Headache    PAD (peripheral artery disease) (HCC)     Past Surgical History:  Procedure Laterality Date   ABDOMINAL SURGERY  2006   Patient states part of intestine was removed   AORTA - BILATERAL FEMORAL ARTERY BYPASS GRAFT Bilateral 10/05/2021   Procedure: AORTOBIFEMORAL BYPASS GRAFT AND LYSIS OF ADHESION;  Surgeon: Magda Debby SAILOR, MD;  Location: [facility] OR;  Service: Vascular;  Laterality: Bilateral;    Family History  Problem Relation Age of Onset   Crohn's disease Mother    Hyperlipidemia Father    Heart failure Father    Heart attack Father 11   Crohn's disease Father    Crohn's disease Sister    Ovarian cancer Maternal Grandmother        64's or 33's   Diabetes Paternal Grandmother    Diabetes Son    Colon cancer Neg Hx     Social History   Socioeconomic History   Marital status: Married    Spouse name: Not on file  Number of children: 2   Years of education: Not on file   Highest education level: Not on file  Occupational History   Not on file  Tobacco Use   Smoking status: Former    Current packs/day: 0.50    Average packs/day: 0.5 packs/day for 40.0 years (20.0 ttl pk-yrs)    Types: Cigarettes   Smokeless tobacco: Never   Tobacco comments:    Quit Nov 2022  Vaping Use   Vaping status: Never Used  Substance and Sexual Activity   Alcohol use: Yes    Comment: occ   Drug use: No    Sexual activity: Not on file  Other Topics Concern   Not on file  Social History Narrative   Not on file   Social Drivers of Health   Financial Resource Strain: Not on file  Food Insecurity: Not on file  Transportation Needs: Not on file  Physical Activity: Not on file  Stress: Not on file  Social Connections: Not on file  Intimate Partner Violence: Not on file    Allergies  Allergen Reactions   Codeine Itching    Current Outpatient Medications  Medication Sig Dispense Refill   acetaminophen  (TYLENOL ) 500 MG tablet Take 1 tablet (500 mg total) by mouth every 6 (six) hours as needed. 30 tablet 0   Adalimumab -atto (AMJEVITA ) 40 MG/0.4ML SOAJ Inject 40 mg into the skin every 14 (fourteen) days. 0.8 mL 5   aspirin  EC 81 MG tablet Take 1 tablet (81 mg total) by mouth daily. Swallow whole. 90 tablet 3   atorvastatin  (LIPITOR) 40 MG tablet TAKE 1 TABLET BY MOUTH EVERY DAY 90 tablet 3   azaTHIOprine  (IMURAN ) 50 MG tablet TAKE 1 TABLET BY MOUTH EVERY DAY 90 tablet 3   buPROPion  (WELLBUTRIN  XL) 300 MG 24 hr tablet TAKE 1 TABLET BY MOUTH EVERY DAY 90 tablet 3   Cholecalciferol (D3-50) 1.25 MG (50000 UT) capsule TAKE 1 CAPSULE BY MOUTH ONE TIME PER WEEK 12 capsule 6   cyanocobalamin  (VITAMIN B12) 1000 MCG/ML injection INJECT 1 ML INTO THE MUSCLE AS DIRECTED EVERY Month 3 mL 2   diclofenac  Sodium (VOLTAREN ) 1 % GEL Apply 4 g topically 4 (four) times daily. As needed for hip pain 50 g 2   empagliflozin  (JARDIANCE ) 10 MG TABS tablet Take 1 tablet (10 mg total) by mouth daily. 90 tablet 3   Current Facility-Administered Medications  Medication Dose Route Frequency Provider Last Rate Last Admin   cyanocobalamin  ((VITAMIN B-12)) injection 1,000 mcg  1,000 mcg Intramuscular UD Abran Norleen SAILOR, MD   1,000 mcg at 07/24/23 0930   cyanocobalamin  (VITAMIN B12) injection 1,000 mcg  1,000 mcg Intramuscular Q30 days Abran Norleen SAILOR, MD   1,000 mcg at 04/28/24 0938    PHYSICAL EXAM Vitals:   05/13/24  1253  BP: (!) 124/51  Pulse: (!) 57  Temp: 98 F (36.7 C)  SpO2: 96%  Weight: 113 lb (51.3 kg)  Height: 5' 6 (1.676 m)    Well-appearing woman in no acute distress Regular rate and rhythm Unlabored breathing Weakly palpable pedal pulses bilaterally Right groin seroma nearly resolved No midline hernia  PERTINENT LABORATORY AND RADIOLOGIC DATA  Most recent CBC    Latest Ref Rng & Units 01/30/2024    9:33 AM 07/24/2023    9:58 AM 01/16/2023   12:30 PM  CBC  WBC 4.0 - 10.5 K/uL 8.1  8.2  10.7   Hemoglobin 12.0 - 15.0 g/dL 85.7  86.4  13.7  Hematocrit 36.0 - 46.0 % 43.0  40.9  40.3   Platelets 150.0 - 400.0 K/uL 175.0  188.0  205.0      Most recent CMP    Latest Ref Rng & Units 01/30/2024    9:33 AM 09/18/2023    8:51 AM 08/20/2023    6:00 PM  CMP  Glucose 70 - 99 mg/dL 777  889  883   BUN 6 - 23 mg/dL 9  12  12    Creatinine 0.40 - 1.20 mg/dL 8.84  8.85  8.86   Sodium 135 - 145 mEq/L 139  143  142   Potassium 3.5 - 5.1 mEq/L 4.3  4.7  5.0   Chloride 96 - 112 mEq/L 100  100  100   CO2 19 - 32 mEq/L 29  25  24    Calcium  8.4 - 10.5 mg/dL 89.8  89.7  89.9   Total Protein 6.0 - 8.3 g/dL 7.5     Total Bilirubin 0.2 - 1.2 mg/dL 1.0     Alkaline Phos 39 - 117 U/L 91     AST 0 - 37 U/L 18     ALT 0 - 35 U/L 12      LDL Chol Calc (NIH)  Date Value Ref Range Status  08/01/2021 90 0 - 99 mg/dL Final   LDL Cholesterol  Date Value Ref Range Status  10/06/2021 17 0 - 99 mg/dL Final    Comment:           Total Cholesterol/HDL:CHD Risk Coronary Heart Disease Risk Table                     Men   Women  1/2 Average Risk   3.4   3.3  Average Risk       5.0   4.4  2 X Average Risk   9.6   7.1  3 X Average Risk  23.4   11.0        Use the calculated Patient Ratio above and the CHD Risk Table to determine the patient's CHD Risk.        ATP III CLASSIFICATION (LDL):  <100     mg/dL   Optimal  899-870  mg/dL   Near or Above                    Optimal  130-159  mg/dL    Borderline  839-810  mg/dL   High  >809     mg/dL   Very High Performed at [facility], [address] [place], [place] [address]      Vascular Imaging:  +-------+-----------+-----------+------------+------------+  ABI/TBIToday's ABIToday's TBIPrevious ABIPrevious TBI  +-------+-----------+-----------+------------+------------+  Right 0.92       0.83       1.04        1.01          +-------+-----------+-----------+------------+------------+  Left  1.06       0.81       1.02        0.89          +-------+-----------+-----------+------------+------------+        Debby SAILOR. Magda, MD FACS [facility] Phone Number: [phone] 05/13/2024 2:03 PM  Total time spent on preparing this encounter including chart review, data review, collecting history, examining the patient, coordinating care for this established patient, 20 minutes  Portions of this report may have been transcribed using voice recognition  software.  Every effort has been made to ensure accuracy; however, inadvertent computerized transcription errors may still be present.

## 2024-05-13 ENCOUNTER — Ambulatory Visit: Attending: Vascular Surgery | Admitting: Vascular Surgery

## 2024-05-13 ENCOUNTER — Encounter: Payer: Self-pay | Admitting: Vascular Surgery

## 2024-05-13 ENCOUNTER — Ambulatory Visit (HOSPITAL_COMMUNITY)
Admission: RE | Admit: 2024-05-13 | Discharge: 2024-05-13 | Disposition: A | Discharge status: Home / Self Care | Source: Ambulatory Visit | Attending: Vascular Surgery | Admitting: Vascular Surgery

## 2024-05-13 VITALS — BP 124/51 | HR 57 | Temp 98.0°F | Ht 66.0 in | Wt 113.0 lb

## 2024-05-13 DIAGNOSIS — Z95828 Presence of other vascular implants and grafts: Secondary | ICD-10-CM | POA: Insufficient documentation

## 2024-05-13 DIAGNOSIS — I739 Peripheral vascular disease, unspecified: Secondary | ICD-10-CM | POA: Diagnosis present

## 2024-05-13 LAB — VAS US ABI WITH/WO TBI
Left ABI: 1.06
Right ABI: 0.92

## 2024-05-29 ENCOUNTER — Ambulatory Visit

## 2024-05-29 DIAGNOSIS — E538 Deficiency of other specified B group vitamins: Secondary | ICD-10-CM | POA: Diagnosis not present

## 2024-05-29 MED ORDER — CYANOCOBALAMIN 1000 MCG/ML IJ SOLN
1000.0000 ug | INTRAMUSCULAR | Status: AC
  Administered 2024-07-01 – 2024-11-27 (×4): 1000 ug via INTRAMUSCULAR

## 2024-07-01 ENCOUNTER — Ambulatory Visit (INDEPENDENT_AMBULATORY_CARE_PROVIDER_SITE_OTHER)

## 2024-07-01 DIAGNOSIS — E538 Deficiency of other specified B group vitamins: Secondary | ICD-10-CM

## 2024-07-29 ENCOUNTER — Other Ambulatory Visit: Payer: Self-pay | Admitting: Family Medicine

## 2024-07-29 DIAGNOSIS — I739 Peripheral vascular disease, unspecified: Secondary | ICD-10-CM

## 2024-07-29 DIAGNOSIS — F4329 Adjustment disorder with other symptoms: Secondary | ICD-10-CM

## 2024-08-01 ENCOUNTER — Ambulatory Visit

## 2024-08-01 DIAGNOSIS — E538 Deficiency of other specified B group vitamins: Secondary | ICD-10-CM | POA: Diagnosis not present

## 2024-08-01 DIAGNOSIS — D649 Anemia, unspecified: Secondary | ICD-10-CM | POA: Diagnosis not present

## 2024-08-04 ENCOUNTER — Other Ambulatory Visit: Payer: Self-pay

## 2024-08-04 DIAGNOSIS — K50919 Crohn's disease, unspecified, with unspecified complications: Secondary | ICD-10-CM

## 2024-08-04 DIAGNOSIS — E538 Deficiency of other specified B group vitamins: Secondary | ICD-10-CM

## 2024-08-15 ENCOUNTER — Other Ambulatory Visit: Payer: Self-pay | Admitting: Internal Medicine

## 2024-08-20 ENCOUNTER — Other Ambulatory Visit (INDEPENDENT_AMBULATORY_CARE_PROVIDER_SITE_OTHER)

## 2024-08-20 ENCOUNTER — Ambulatory Visit: Payer: Self-pay | Admitting: Internal Medicine

## 2024-08-20 DIAGNOSIS — K50919 Crohn's disease, unspecified, with unspecified complications: Secondary | ICD-10-CM | POA: Diagnosis not present

## 2024-08-20 DIAGNOSIS — E538 Deficiency of other specified B group vitamins: Secondary | ICD-10-CM

## 2024-08-20 LAB — COMPREHENSIVE METABOLIC PANEL WITH GFR
ALT: 13 U/L (ref 0–35)
AST: 17 U/L (ref 0–37)
Albumin: 4.3 g/dL (ref 3.5–5.2)
Alkaline Phosphatase: 92 U/L (ref 39–117)
BUN: 13 mg/dL (ref 6–23)
CO2: 27 meq/L (ref 19–32)
Calcium: 9.4 mg/dL (ref 8.4–10.5)
Chloride: 100 meq/L (ref 96–112)
Creatinine, Ser: 1.14 mg/dL (ref 0.40–1.20)
GFR: 52.7 mL/min — ABNORMAL LOW
Glucose, Bld: 116 mg/dL — ABNORMAL HIGH (ref 70–99)
Potassium: 3.6 meq/L (ref 3.5–5.1)
Sodium: 138 meq/L (ref 135–145)
Total Bilirubin: 0.6 mg/dL (ref 0.2–1.2)
Total Protein: 7 g/dL (ref 6.0–8.3)

## 2024-08-20 LAB — CBC WITH DIFFERENTIAL/PLATELET
Basophils Absolute: 0 K/uL (ref 0.0–0.1)
Basophils Relative: 0.2 % (ref 0.0–3.0)
Eosinophils Absolute: 0.2 K/uL (ref 0.0–0.7)
Eosinophils Relative: 2.2 % (ref 0.0–5.0)
HCT: 41.6 % (ref 36.0–46.0)
Hemoglobin: 13.6 g/dL (ref 12.0–15.0)
Lymphocytes Relative: 23.3 % (ref 12.0–46.0)
Lymphs Abs: 1.8 K/uL (ref 0.7–4.0)
MCHC: 32.6 g/dL (ref 30.0–36.0)
MCV: 96.6 fl (ref 78.0–100.0)
Monocytes Absolute: 0.6 K/uL (ref 0.1–1.0)
Monocytes Relative: 7.7 % (ref 3.0–12.0)
Neutro Abs: 5.2 K/uL (ref 1.4–7.7)
Neutrophils Relative %: 66.6 % (ref 43.0–77.0)
Platelets: 182 K/uL (ref 150.0–400.0)
RBC: 4.31 Mil/uL (ref 3.87–5.11)
RDW: 15 % (ref 11.5–15.5)
WBC: 7.9 K/uL (ref 4.0–10.5)

## 2024-08-20 LAB — C-REACTIVE PROTEIN: CRP: <0.5 mg/dL (ref 0.5–20.0)

## 2024-08-20 LAB — VITAMIN B12: Vitamin B-12: 402 pg/mL (ref 211–911)

## 2024-08-26 ENCOUNTER — Other Ambulatory Visit: Payer: Self-pay | Admitting: Family Medicine

## 2024-08-26 DIAGNOSIS — Z1231 Encounter for screening mammogram for malignant neoplasm of breast: Secondary | ICD-10-CM

## 2024-09-01 ENCOUNTER — Ambulatory Visit (INDEPENDENT_AMBULATORY_CARE_PROVIDER_SITE_OTHER)

## 2024-09-01 DIAGNOSIS — E538 Deficiency of other specified B group vitamins: Secondary | ICD-10-CM | POA: Diagnosis not present

## 2024-09-12 ENCOUNTER — Other Ambulatory Visit: Payer: Self-pay | Admitting: Internal Medicine

## 2024-09-12 ENCOUNTER — Telehealth: Payer: Self-pay

## 2024-09-12 ENCOUNTER — Other Ambulatory Visit (HOSPITAL_COMMUNITY): Payer: Self-pay

## 2024-09-12 DIAGNOSIS — R7989 Other specified abnormal findings of blood chemistry: Secondary | ICD-10-CM

## 2024-09-12 NOTE — Telephone Encounter (Signed)
 Pt sent message in mychart to call and schedule an appt.

## 2024-09-12 NOTE — Telephone Encounter (Signed)
 Pharmacy Patient Advocate Encounter   Received notification from Patient Pharmacy that prior authorization for AMJEVITA (CF) 40MG /0.4ML AUTOIN is required/requested.   Insurance verification completed.   The patient is insured through [facility].   Per test claim:  Patient needs to be seen for updated chart notes on how patient is responding to therapy.

## 2024-09-18 ENCOUNTER — Telehealth: Payer: Self-pay | Admitting: Internal Medicine

## 2024-09-18 MED ORDER — AMJEVITA 40 MG/0.4ML ~~LOC~~ SOAJ: 1.0000 | SUBCUTANEOUS | 5 refills | Status: AC

## 2024-09-18 NOTE — Telephone Encounter (Signed)
 Inbound call from patient stating that she needs a refill for Amjevita . Patient has been scheduled for office visit with Dr. Abran on 1/20 at 10:20 and is requesting refill be sent because she is due at the end of the month. Please advise.

## 2024-09-18 NOTE — Telephone Encounter (Signed)
 Tried to be helpful by refilling her biologic but PA can't be completed without an office visit, which I didn't realize at first.  Her appointment isn't until the first of the year but she has an injection scheduled end of this month.

## 2024-09-18 NOTE — Telephone Encounter (Signed)
 Sent in Amjevita  - prompt came up that it needed a PA;  please initiate.  Thanks!

## 2024-09-18 NOTE — Telephone Encounter (Signed)
 Pt scheduled to see Dr. Abran 09/25/24@3 :40pm. Pt notified of appt via mychart.

## 2024-09-23 ENCOUNTER — Ambulatory Visit
Admission: RE | Admit: 2024-09-23 | Discharge: 2024-09-23 | Disposition: A | Source: Ambulatory Visit | Attending: Family Medicine

## 2024-09-23 DIAGNOSIS — Z1231 Encounter for screening mammogram for malignant neoplasm of breast: Secondary | ICD-10-CM

## 2024-09-25 ENCOUNTER — Ambulatory Visit: Payer: Self-pay | Admitting: Family Medicine

## 2024-09-25 ENCOUNTER — Ambulatory Visit (INDEPENDENT_AMBULATORY_CARE_PROVIDER_SITE_OTHER): Admitting: Internal Medicine

## 2024-09-25 ENCOUNTER — Encounter: Payer: Self-pay | Admitting: Internal Medicine

## 2024-09-25 VITALS — BP 128/80 | HR 84 | Ht 65.5 in | Wt 119.0 lb

## 2024-09-25 DIAGNOSIS — K50919 Crohn's disease, unspecified, with unspecified complications: Secondary | ICD-10-CM

## 2024-09-25 DIAGNOSIS — E538 Deficiency of other specified B group vitamins: Secondary | ICD-10-CM

## 2024-09-25 NOTE — Patient Instructions (Signed)
 Please follow up in 6 months.  _______________________________________________________  If your blood pressure at your visit was 140/90 or greater, please contact your primary care physician to follow up on this.  _______________________________________________________  If you are age 59 or older, your body mass index should be between 23-30. Your Body mass index is 19.51 kg/m. If this is out of the aforementioned range listed, please consider follow up with your Primary Care Provider.  If you are age 28 or younger, your body mass index should be between 19-25. Your Body mass index is 19.51 kg/m. If this is out of the aformentioned range listed, please consider follow up with your Primary Care Provider.   ________________________________________________________  The [facility] providers would like to encourage you to use MYCHART to communicate with providers for non-urgent requests or questions.  Due to long hold times on the telephone, sending your provider a message by [facility] may be a faster and more efficient way to get a response.  Please allow 48 business hours for a response.  Please remember that this is for non-urgent requests.  _______________________________________________________  [facility] is using a team-based approach to care.  Your team is made up of your doctor and two to three APPS. Our APPS (Nurse Practitioners and Physician Assistants) work with your physician to ensure care continuity for you. They are fully qualified to address your health concerns and develop a treatment plan. They communicate directly with your gastroenterologist to care for you. Seeing the Advanced Practice Practitioners on your physician's team can help you by facilitating care more promptly, often allowing for earlier appointments, access to diagnostic testing, procedures, and other specialty referrals.

## 2024-09-26 ENCOUNTER — Telehealth: Payer: Self-pay

## 2024-09-26 ENCOUNTER — Other Ambulatory Visit (HOSPITAL_COMMUNITY): Payer: Self-pay

## 2024-09-26 NOTE — Telephone Encounter (Signed)
 Pharmacy Patient Advocate Encounter   Received notification from Pt Calls Messages that prior authorization for Amjevita  40 MG/0.4 ML SOAJ is required/requested.   Insurance verification completed.   The patient is insured through [facility].   Per test claim: xxx

## 2024-09-29 ENCOUNTER — Encounter: Payer: Self-pay | Admitting: Internal Medicine

## 2024-09-29 NOTE — Telephone Encounter (Signed)
 Note forwarded to Rosina Rhymes, CPht for completion of PA Amjevita .

## 2024-09-29 NOTE — Progress Notes (Signed)
 HISTORY OF PRESENT ILLNESS:  Melissa Chaney is a 58 y.o. female with a history of Crohn's disease status post ileocecectomy October 2006 followed by reoperation with resection for small bowel obstruction November 2006.  She also has a history of adenomatous colon polyps.  Her disease is associated B12 deficiency for which she is on replacement.  She had been on 6-mercaptopurine  and Humira , and doing well, until she stopped due to insurance reasons.   OFFICE VISIT January 16, 2023: She last underwent colonoscopy March 02, 2022.  She was found to have mild ileal Crohn's disease.  The colon was normal.  No stenosis.  Her last CT enterography was performed February 16, 2022.  No evidence for active Crohn's.  She was last seen in the office April 07, 2022.  Dictation for details.  She has not had significant blood work in 1 year.  Her last C-reactive protein April 07, 2022 was normal.  B12 at that time was low at 121.  She is due for B12 shot today.  She asked for medication refills.  She did restart Humira  and Imuran  October 2023.  Currently taking 40 mg of Humira  subcutaneously once every 2 weeks.  Her Imuran  dose is 50 mg daily.  She states she is feeling well without complaints.  Occasional bloating but otherwise well.  with a history of Crohn's disease status post ileocecectomy October 2006 followed by reoperation with resection for small bowel obstruction November 2006.  She also has a history of adenomatous colon polyps.  Her disease is associated B12 deficiency for which she is on replacement.  She had been on 6-mercaptopurine  and Humira , and doing well, until she stopped due to insurance reasons.   OFFICE VISIT February 26, 2024: She last underwent colonoscopy March 02, 2022.  She was found to have mild ileal Crohn's disease.  The colon was normal.  No stenosis.  Her last CT enterography was performed February 16, 2022.  No evidence for active Crohn's.  She was last seen in the office April 07, 2022.  Dictation for  details.  She has not had significant blood work in 1 year.  Her last C-reactive protein April 07, 2022 was normal.  B12 at that time was low at 121.  She is due for B12 shot today.  She asked for medication refills.  She did restart Humira  and Imuran  October 2023.  Currently taking 40 mg of Humira  subcutaneously once every 2 weeks.  Her Imuran  dose is 50 mg daily.  She states she is feeling well without complaints.  Occasional bloating but otherwise well.  Patient presents today for routine follow-up.  In June, she was started on the Humira  biosimilar Amjevita .  She also continues on 6-mercaptopurine  50 mg daily.  She is pleased to report that she is feeling well on this current medical regimen.  She did have symptoms when her therapy was interrupted for insurance purposes.  She is doing better in terms of pain and bowel habits.  Currently without pain.  Bowel habits are regular.  Blood work shows improvement in inflammation markers on current medical therapy.  She requests medication refills    REVIEW OF SYSTEMS:  All non-GI ROS negative except for back pain, night sweats, arthritis  Past Medical History:  Diagnosis Date   Arthritis    Crohn disease (HCC)    Depression    Headache    PAD (peripheral artery disease)     Past Surgical History:  Procedure Laterality Date   ABDOMINAL  SURGERY  2006   Patient states part of intestine was removed   AORTA - BILATERAL FEMORAL ARTERY BYPASS GRAFT Bilateral 10/05/2021   Procedure: AORTOBIFEMORAL BYPASS GRAFT AND LYSIS OF ADHESION;  Surgeon: Magda Debby SAILOR, MD;  Location: [facility] OR;  Service: Vascular;  Laterality: Bilateral;    Social History XOE HOE  reports that she has quit smoking. Her smoking use included cigarettes. She has a 20 pack-year smoking history. She has never used smokeless tobacco. She reports current alcohol use. She reports that she does not use drugs.  family history includes Crohn's disease in her father, mother, and  sister; Diabetes in her paternal grandmother and son; Heart attack (age of onset: 53) in her father; Heart failure in her father; Hyperlipidemia in her father; Ovarian cancer in her maternal grandmother.  Allergies  Allergen Reactions   Codeine Itching       PHYSICAL EXAMINATION: Vital signs: BP 128/80 (BP Location: Left Arm, Patient Position: Sitting, Cuff Size: Normal)   Pulse 84   Ht 5' 5.5 (1.664 m) Comment: height measured without shoes  Wt 119 lb 0.6 oz (54 kg)   BMI 19.51 kg/m   Constitutional: generally well-appearing, no acute distress Psychiatric: alert and oriented x3, cooperative Eyes: extraocular movements intact, anicteric, conjunctiva pink Mouth: oral pharynx moist, no lesions Neck: supple no lymphadenopathy Cardiovascular: heart regular rate and rhythm, no murmur Lungs: clear to auscultation bilaterally Abdomen: soft, nontender, nondistended, no obvious ascites, no peritoneal signs, normal bowel sounds, no organomegaly.  Surgical incisions well-healed Rectal: Omitted Extremities: no clubbing, cyanosis, or lower extremity edema bilaterally Skin: no lesions on visible extremities Neuro: No focal deficits.  Cranial nerves intact  ASSESSMENT:   1.  History of Crohn's disease status post ileocecectomy October 2006 followed by reoperation with resection of small bowel obstruction November 2006.  Currently doing well on Imuran  and Amjevita .  Last CT enterography negative for active disease.  Last colonoscopy with minimal ileal inflammation.  SIGNIFICANT RECURRENT SYMPTOMS WHEN BIOLOGIC THERAPY WAS INTERRUPTED FOR INSURANCE PURPOSES.  Now doing better with resumption of biologic therapy in terms of pain, bowel habits, and inflammatory markers. 2.  B12 deficiency.  On replacement     PLAN:   1.  Continue Amjevita .  Refilled.  Medication risk reviewed 2.  Continue Imuran .  Medication refilled.  Medication risk reviewed. 3.  Continue B12 replacement.  Refilled. 4.  Blood  work t completed August 20, 2024  5.  Future blood work at her next visit. 6.  Routine office follow-up 6 months.  Contact the office in the interim for any questions or problems.  She agrees. A total time of 30 minutes was spent preparing to see the patient, obtaining interval history, performing medically appropriate physical examination, counseling and educating the patient regarding the above listed issues, ordering multiple medications, defining follow-up parameters, and documenting clinical information in the health record

## 2024-09-29 NOTE — Telephone Encounter (Signed)
 Completed and sent to you.  Thanks

## 2024-09-30 ENCOUNTER — Telehealth: Payer: Self-pay

## 2024-09-30 ENCOUNTER — Other Ambulatory Visit (HOSPITAL_COMMUNITY): Payer: Self-pay

## 2024-09-30 NOTE — Telephone Encounter (Signed)
 Pharmacy Patient Advocate Encounter  Received notification from [employer] that Prior Authorization for  AMJEVITA (CF) 40MG /0.4ML AUTOIN  has been APPROVED from 09-30-2024 to 09-29-2025   PA #/Case ID/Reference #: [id number]

## 2024-09-30 NOTE — Telephone Encounter (Signed)
 Pharmacy Patient Advocate Encounter   Received notification from Pt Calls Messages that prior authorization for AMJEVITA (CF) 40MG /0.4ML AUTOIN is required/requested.   Insurance verification completed.   The patient is insured through [facility].   Per test claim: PA required; PA submitted to above mentioned insurance via Prompt PA Key/confirmation #/EOC [id number] Status is pending

## 2024-10-01 ENCOUNTER — Ambulatory Visit

## 2024-10-27 ENCOUNTER — Ambulatory Visit (INDEPENDENT_AMBULATORY_CARE_PROVIDER_SITE_OTHER)

## 2024-10-27 ENCOUNTER — Other Ambulatory Visit: Payer: Self-pay | Admitting: Family Medicine

## 2024-10-27 DIAGNOSIS — E119 Type 2 diabetes mellitus without complications: Secondary | ICD-10-CM

## 2024-10-27 DIAGNOSIS — E538 Deficiency of other specified B group vitamins: Secondary | ICD-10-CM

## 2024-10-27 DIAGNOSIS — N1831 Chronic kidney disease, stage 3a: Secondary | ICD-10-CM

## 2024-11-22 ENCOUNTER — Other Ambulatory Visit: Payer: Self-pay | Admitting: Family Medicine

## 2024-11-22 DIAGNOSIS — N1831 Chronic kidney disease, stage 3a: Secondary | ICD-10-CM

## 2024-11-22 DIAGNOSIS — E119 Type 2 diabetes mellitus without complications: Secondary | ICD-10-CM

## 2024-11-25 ENCOUNTER — Ambulatory Visit: Admitting: Internal Medicine

## 2024-11-26 ENCOUNTER — Other Ambulatory Visit: Payer: Self-pay | Admitting: Internal Medicine

## 2024-11-27 ENCOUNTER — Ambulatory Visit

## 2024-11-27 DIAGNOSIS — E538 Deficiency of other specified B group vitamins: Secondary | ICD-10-CM

## 2024-11-27 MED ORDER — CYANOCOBALAMIN 1000 MCG/ML IJ SOLN: INTRAMUSCULAR | 3 refills | Status: AC

## 2024-11-27 NOTE — Patient Instructions (Addendum)
 B12 monthly shot given. B12 refilled as requested. Patient is taking this life long she said and she is up to date on her office visits.

## 2024-12-29 ENCOUNTER — Ambulatory Visit
